# Patient Record
Sex: Male | Born: 1935 | Race: White | Hispanic: No | Smoking: Former smoker
Health system: Southern US, Community
[De-identification: ages and names within clinical notes are randomized; demographics above are authoritative.]

## PROBLEM LIST (undated history)

## (undated) DIAGNOSIS — J930 Spontaneous tension pneumothorax: Secondary | ICD-10-CM

## (undated) DIAGNOSIS — R0600 Dyspnea, unspecified: Secondary | ICD-10-CM

## (undated) DIAGNOSIS — R0602 Shortness of breath: Secondary | ICD-10-CM

## (undated) DIAGNOSIS — J449 Chronic obstructive pulmonary disease, unspecified: Secondary | ICD-10-CM

## (undated) DIAGNOSIS — E785 Hyperlipidemia, unspecified: Secondary | ICD-10-CM

## (undated) DIAGNOSIS — G4733 Obstructive sleep apnea (adult) (pediatric): Secondary | ICD-10-CM

## (undated) DIAGNOSIS — M1990 Unspecified osteoarthritis, unspecified site: Secondary | ICD-10-CM

## (undated) DIAGNOSIS — I10 Essential (primary) hypertension: Secondary | ICD-10-CM

## (undated) HISTORY — DX: Spontaneous tension pneumothorax: J93.0

## (undated) HISTORY — PX: EYE SURGERY: SHX253

## (undated) HISTORY — PX: LUNG SURGERY: SHX703

## (undated) HISTORY — PX: TOE SURGERY: SHX1073

## (undated) HISTORY — DX: Essential (primary) hypertension: I10

## (undated) HISTORY — DX: Hyperlipidemia, unspecified: E78.5

## (undated) HISTORY — DX: Obstructive sleep apnea (adult) (pediatric): G47.33

## (undated) HISTORY — DX: Shortness of breath: R06.02

---

## 1958-07-06 DIAGNOSIS — J930 Spontaneous tension pneumothorax: Secondary | ICD-10-CM

## 1958-07-06 HISTORY — DX: Spontaneous tension pneumothorax: J93.0

## 2001-10-26 ENCOUNTER — Encounter: Admission: RE | Admit: 2001-10-26 | Discharge: 2001-10-26 | Payer: Self-pay | Admitting: *Deleted

## 2001-10-26 ENCOUNTER — Encounter: Payer: Self-pay | Admitting: *Deleted

## 2001-10-31 ENCOUNTER — Ambulatory Visit (HOSPITAL_BASED_OUTPATIENT_CLINIC_OR_DEPARTMENT_OTHER): Admission: RE | Admit: 2001-10-31 | Discharge: 2001-10-31 | Payer: Self-pay | Admitting: *Deleted

## 2001-10-31 ENCOUNTER — Encounter (INDEPENDENT_AMBULATORY_CARE_PROVIDER_SITE_OTHER): Payer: Self-pay

## 2002-03-04 ENCOUNTER — Emergency Department (HOSPITAL_COMMUNITY): Admission: EM | Admit: 2002-03-04 | Discharge: 2002-03-04 | Payer: Self-pay | Admitting: Emergency Medicine

## 2008-08-16 ENCOUNTER — Emergency Department (HOSPITAL_COMMUNITY): Admission: EM | Admit: 2008-08-16 | Discharge: 2008-08-16 | Payer: Self-pay | Admitting: Emergency Medicine

## 2008-08-17 ENCOUNTER — Inpatient Hospital Stay (HOSPITAL_COMMUNITY): Admission: EM | Admit: 2008-08-17 | Discharge: 2008-08-19 | Payer: Self-pay | Admitting: Emergency Medicine

## 2008-08-17 ENCOUNTER — Ambulatory Visit: Payer: Self-pay | Admitting: Family Medicine

## 2009-09-13 ENCOUNTER — Ambulatory Visit (HOSPITAL_BASED_OUTPATIENT_CLINIC_OR_DEPARTMENT_OTHER): Admission: RE | Admit: 2009-09-13 | Discharge: 2009-09-14 | Payer: Self-pay | Admitting: Urology

## 2010-09-28 LAB — URINALYSIS, ROUTINE W REFLEX MICROSCOPIC
Bilirubin Urine: NEGATIVE
Glucose, UA: NEGATIVE mg/dL
Ketones, ur: NEGATIVE mg/dL
Leukocytes, UA: NEGATIVE
Nitrite: NEGATIVE
Protein, ur: NEGATIVE mg/dL
Specific Gravity, Urine: 1.022 (ref 1.005–1.030)
Urobilinogen, UA: 0.2 mg/dL (ref 0.0–1.0)
pH: 5.5 (ref 5.0–8.0)

## 2010-09-28 LAB — PROTIME-INR: Prothrombin Time: 13.3 seconds (ref 11.6–15.2)

## 2010-09-28 LAB — CBC
HCT: 44.6 % (ref 39.0–52.0)
Hemoglobin: 14.4 g/dL (ref 13.0–17.0)
MCHC: 32.3 g/dL (ref 30.0–36.0)
MCV: 92.4 fL (ref 78.0–100.0)
Platelets: 188 10*3/uL (ref 150–400)
RBC: 4.83 MIL/uL (ref 4.22–5.81)
RDW: 14.9 % (ref 11.5–15.5)
WBC: 8.7 10*3/uL (ref 4.0–10.5)

## 2010-09-28 LAB — URINE MICROSCOPIC-ADD ON

## 2010-09-28 LAB — COMPREHENSIVE METABOLIC PANEL
ALT: 18 U/L (ref 0–53)
Albumin: 4.3 g/dL (ref 3.5–5.2)
Alkaline Phosphatase: 67 U/L (ref 39–117)
Chloride: 103 mEq/L (ref 96–112)
Glucose, Bld: 100 mg/dL — ABNORMAL HIGH (ref 70–99)
Potassium: 3.3 mEq/L — ABNORMAL LOW (ref 3.5–5.1)
Sodium: 139 mEq/L (ref 135–145)
Total Bilirubin: 0.7 mg/dL (ref 0.3–1.2)
Total Protein: 7.5 g/dL (ref 6.0–8.3)

## 2010-10-21 ENCOUNTER — Inpatient Hospital Stay (INDEPENDENT_AMBULATORY_CARE_PROVIDER_SITE_OTHER)
Admission: RE | Admit: 2010-10-21 | Discharge: 2010-10-21 | Disposition: A | Discharge status: Home / Self Care | Payer: Medicare Other | Source: Ambulatory Visit | Attending: Emergency Medicine | Admitting: Emergency Medicine

## 2010-10-21 DIAGNOSIS — R300 Dysuria: Secondary | ICD-10-CM

## 2010-10-21 LAB — CBC
HCT: 35.5 % — ABNORMAL LOW (ref 39.0–52.0)
Hemoglobin: 12.5 g/dL — ABNORMAL LOW (ref 13.0–17.0)
Hemoglobin: 14.4 g/dL (ref 13.0–17.0)
MCHC: 34.9 g/dL (ref 30.0–36.0)
MCV: 88.2 fL (ref 78.0–100.0)
MCV: 89.4 fL (ref 78.0–100.0)
MCV: 90.1 fL (ref 78.0–100.0)
Platelets: 147 10*3/uL — ABNORMAL LOW (ref 150–400)
RBC: 3.98 MIL/uL — ABNORMAL LOW (ref 4.22–5.81)
RBC: 4.69 MIL/uL (ref 4.22–5.81)
RDW: 14.3 % (ref 11.5–15.5)
WBC: 9.3 10*3/uL (ref 4.0–10.5)
WBC: 9.6 10*3/uL (ref 4.0–10.5)

## 2010-10-21 LAB — BASIC METABOLIC PANEL
BUN: 11 mg/dL (ref 6–23)
BUN: 12 mg/dL (ref 6–23)
Calcium: 9 mg/dL (ref 8.4–10.5)
Chloride: 103 mEq/L (ref 96–112)
Chloride: 104 mEq/L (ref 96–112)
Creatinine, Ser: 0.84 mg/dL (ref 0.4–1.5)
GFR calc Af Amer: >60 mL/min (ref >60)
GFR calc non Af Amer: >60 mL/min (ref >60)
Potassium: 3.2 mEq/L — ABNORMAL LOW (ref 3.5–5.1)

## 2010-10-21 LAB — POCT URINALYSIS DIP (DEVICE)
Protein, ur: 30 mg/dL — AB
Specific Gravity, Urine: 1.025 (ref 1.005–1.030)
Urobilinogen, UA: 0.2 mg/dL (ref 0.0–1.0)

## 2010-10-21 LAB — RPR: RPR Ser Ql: NONREACTIVE

## 2010-10-21 LAB — CULTURE, BLOOD (ROUTINE X 2)

## 2010-10-21 LAB — PROTIME-INR
INR: 1 (ref 0.00–1.49)
Prothrombin Time: 13.3 seconds (ref 11.6–15.2)

## 2010-10-21 LAB — COMPREHENSIVE METABOLIC PANEL
BUN: 12 mg/dL (ref 6–23)
CO2: 27 mEq/L (ref 19–32)
Calcium: 8.6 mg/dL (ref 8.4–10.5)
Creatinine, Ser: 0.81 mg/dL (ref 0.4–1.5)
GFR calc non Af Amer: >60 mL/min (ref >60)
Glucose, Bld: 106 mg/dL — ABNORMAL HIGH (ref 70–99)

## 2010-10-21 LAB — DIFFERENTIAL
Eosinophils Absolute: 0 10*3/uL (ref 0.0–0.7)
Lymphs Abs: 1.7 10*3/uL (ref 0.7–4.0)
Neutrophils Relative %: 75 % (ref 43–77)

## 2010-10-21 LAB — HIV ANTIBODY (ROUTINE TESTING W REFLEX): HIV: NONREACTIVE

## 2010-10-21 LAB — APTT: aPTT: 33 seconds (ref 24–37)

## 2010-11-13 ENCOUNTER — Telehealth: Payer: Self-pay | Admitting: Cardiology

## 2010-11-18 ENCOUNTER — Encounter: Payer: Self-pay | Admitting: Internal Medicine

## 2010-11-18 NOTE — H&P (Signed)
NAME:  Joseph [name], Joseph [name]              ACCOUNT NO.:  [id number]   MEDICAL RECORD NO.:  [id number]          PATIENT TYPE:  EMS   LOCATION:  MAJO                         FACILITY:  [facility]   PHYSICIAN:  C. Ulyess Mort, M.D.DATE OF BIRTH:  [birth year]   DATE OF ADMISSION:  08/16/2008  DATE OF DISCHARGE:  08/16/2008                              HISTORY & PHYSICAL   HISTORY OF PRESENT ILLNESS:  Joseph [name] is a 76 year old man with a  past medical history of BPH, history of prostatitis, hypertension, and  hyperlipidemia, who presented to [facility] Emergency Department with  complaints of a rash.  One week prior to admission, the patient noticed  what he describes as nodules in his perianal region.  He decided to  apply a cream that he had been prescribed about 5 years prior.  The  cream is named Aldara (imiquimod), and it was given to him for this same  problem with perianal nodule in the past.  After the first application,  he noted pruritus but did not think much of it.  On the second day,  after he applied it he noticed pain in the area.  He finally applied  some Benadryl cream in hopes that it would help with the pruritus and he  then applied some Delfen cream.  About 3 days prior to admission, the  patient started developing oropharyngeal ulcerations with pain in his  mouth.  He also developed small erythematous lesions on his extremities,  chest, and, back and had progressive irritation and pain in the perianal  region.  He decided to come to the emergency room to have his lesions  evaluated.  The patient has never had a similar presentation before.  He  denied any fevers or chills.  He does not have any documented medication  allergies.   ALLERGIES:  No known drug allergies.   PAST MEDICAL HISTORY:  1. BPH status post prostate biopsy in April 2003 which revealed benign      tissue.  The patient has been following up with his urologist every      6 months since then.  2. History of  prostatitis in April 2003.  3. Left spermatocele.  4. Hypertension.  5. Hyperlipidemia.   SUBSTANCE HISTORY:  The patient denies any tobacco, alcohol, or drug  use.   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  He is  uninsured.   FAMILY HISTORY:  His father died in his 44s of cancer.   REVIEW OF SYSTEMS:  A 12-point review of systems was negative except for  elements described in HPI.   PHYSICAL EXAMINATION:  VITAL SIGNS:  Temperature 97.9, blood pressure  123/71, respiratory rate 18, O2 sat 97% on room air, heart rate 78.  GENERAL:  Elderly man in no acute distress.  EYES:  Congenital injection with tearing and some purulent debris noted,  PERRLA, EOMI.  ENT:  Tongue dry with a white coating.  Several mucosal oropharyngeal  ulcerations of about 5-7 mm in diameter.  None of the ulcers were  bleeding or necrotic.  NECK:  Supple.  No lymphadenopathy, no thyromegaly, or carotid bruit.  RESPIRATORY:  Lungs clear to auscultation bilaterally with good air  movement.  CARDIOVASCULAR:  Regular rate and rhythm.  No murmurs, rubs, or gallops.  GI:  Bowel sounds positive.  ABDOMEN:  Soft, nontender, nondistended.  No palpable masses or  organomegaly.  EXTREMITIES:  No edema, cyanosis, or clubbing.  GU:  Perianal mucosal erythema with mild edema.  There are also some  small nodular/pustular lesions noted that do have the appearance of  condylomas.  SKIN:  Few scattered, erythematous, more well-defined macules on  extremities, abdomen, and back.  NEUROLOGIC:  The patient is awake, alert, and oriented x3.  Cranial  nerves intact.  Strength 5/5 in all extremities.  Sensitivity to light  touch, normal and symmetrical.  Gait normal.  PSYCHIATRIC:  Appropriate.   ADMISSION LABORATORY DATA:  White blood count 8.0, hemoglobin 14.4,  platelets 146, MCV 88, RDW 14.3.  PT 13.3, PTT 33.  Sodium 139,  potassium 2.9 with slight hemolysis noted, chloride 102, bicarb 27, BUN  12,  creatinine 0.1, glucose 106, calcium 8.6.  Total bili is 1.0, alk  phos 78, AST 22, ALT 16, total protein 7.0, albumin 3.96.   ASSESSMENT AND PLAN:  1. 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  The      patient has characteristic mucosal, conjunctival, and skin      involvement.  However, he does not have skin sloughing or epidermal      necrolysis.  At this point, he does not require any burn wound      care, but it is difficult to tell how this will progress.  The      patient refused hospitalization, but he did agree to returning to      the emergency room if his lesions progress or persisted.  He was      given a prescription for viscous lidocaine for comfort, Debacterol,      1 mL applicator to be applied to the ulcers b.i.d.  The patient was      instructed not to use the imiquimod cream anymore given that he      would likely develop a similar reaction in the future.  He was      advised to seek advice from either his dermatologist or his      urologist about the perianal lesions that he has.  At this point,      it is difficult to tell if the inflammation and nodules are      secondary to the Stevens-Johnson syndrome with mucosal involvement      or if they are the underlying pathology that led the patient to use      the cream.  The patient claims to be sexually active, does always      use protection, and he denies any rectal intercourse.  She also      denies any history of sexually transmitted diseases.  2. Hypokalemia.  The patient's potassium was 2.9 on admission and this      is despite slight hemolysis described by the lab.  As far as I can      tell, he is only on hydrochlorothiazide which could lead to      hypokalemia and he is not known to be on potassium supplementation      as an outpatient.  He was given 40 mEq of KCl in the emergency room      and given 10 tablets of KCl  20 mEq to be taken b.i.d. for 5 days      until he follows up with his  cardiologist whom he sees for his      blood pressure.  The patient is aware that not treating his      hyperkalemia could lead to cardiac arrhythmias or other potentially      life-threatening medical problems.  Again, the patient decided to      leave AMA.  Before he left, he signed an AMA form where the      potential risks of him not staying in the hospital for observation      were listed.      Olene Craven, M.D.  Electronically Signed      C. Ulyess Mort, M.D.  Electronically Signed   MC/MEDQ  D:  08/16/2008  T:  08/17/2008  Job:  [id number]

## 2010-11-18 NOTE — H&P (Signed)
Joseph [name], Joseph [name] NO.:  [id number]   MEDICAL RECORD NO.:  [id number]          PATIENT TYPE:  INP   LOCATION:  5507                         FACILITY:  [facility]   PHYSICIAN:  Pearlean Brownie, M.D.DATE OF BIRTH:  22-Mar-1935   DATE OF ADMISSION:  08/17/2008  DATE OF DISCHARGE:                              HISTORY & PHYSICAL   PRIMARY CARE PHYSICIAN:  [facility].   PRIMARY CARDIOLOGIST:  Peter M. Swazil[place], MD   PRIMARY UROLOGIST:  Lucrezia Starch. Earlene Plater, MD   CHIEF COMPLAINT:  Rash concerning for Stevens-Johnson syndrome.   HISTORY OF PRESENT ILLNESS:  This is a 76 year old pleasant white male  with no significant past medical history who presents with 45-day  history of rash after using Aldara cream on his perianal area.  He used  an old bottle of the Aldara cream on a perianal lesion that he was  seen by Dermatology for back in 2005.  He is not sure of the lesion  type.  He started the cream approximately 7-8 days ago and did not note  much for the first few days except for a little bit of itching and  burning.  He first noticed his mouth swelling with a little bit of pain  approximately 4 days ago with associated tearing and redness of his eyes  bilaterally.  The skin involvement of the hands with vesicular lesions  started yesterday.  He was seen in the emergency department at that time  and advised to be admitted for Stevens-Johnson syndrome, but the patient  declined.  He subsequently went to [facility] today due to the  fact that rash was starting to spread a little bit more in his hands.  He denies nausea, vomiting, diarrhea, fevers, or malaise.  He denies a  history of skin infections or reactions.  He denies a history of genital  herpes or warts.  He denies high-risk sexual behaviors or exposure to  HIV.   REVIEW OF SYSTEMS:  Negative except for above.   PAST MEDICAL HISTORY:  1. Hypertension.  2. Hyperlipidemia.  3. BPH.  4. Some type  of skin lesion on the perianal area, diagnosed in 2005.   PAST SURGICAL HISTORY:  1. Multiple prostate biopsies for BPH and elevated PSA.  2. Chest tube placed for spontaneous pneumothorax in 1966.   ALLERGIES:  ACE INHIBITORS cause angioedema.   MEDICATIONS:  1. Zetia, unknown dose.  2. Hydrochlorothiazide unknown dose.  3. Amlodipine 5 mg p.o. daily.  4. Ibuprofen p.r.n.  5. Aldara as above.   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  He is single and has three cats.  He has no  children.  He does not smoke.  He drinks occasional alcohol.  Denies any  illicit drugs.  His father was Dr. Leo Rod who was a general  practitioner here in [place].   FAMILY HISTORY:  Parents died of old age.  There is nothing significant  in family history that is contributory to the current problem.   ADVANCE DIRECTIVES:  The patient does not have any specific advance  directives.   PHYSICAL EXAMINATION:  VITAL SIGNS:  Temperature is 99.4, heart rate  121, blood pressure 111/79, respirations 18, and he is 98% on room air.  GENERAL:  This is a pleasant white male in no apparent distress.  HEENT:  Normal vision with glasses with the bedside Snellen chart.  Conjunctivae are injected bilaterally.  He has diffuse mucosal denuding  involving the mouth, palate, and tongue but not the nares.  NECK:  Supple.  CARDIOVASCULAR:  Regular rate and rhythm.  No murmurs, rubs, or gallops.  PULMONARY:  Clear to auscultation bilaterally.  ABDOMEN:  Positive bowel sounds, soft, nontender, and nondistended.  EXTREMITIES:  No edema.  SKIN:  HEENT exam as above.  Bilateral hands and the palms and dorsal  aspects have vesicles that are a few millimeters in size each and do not  seem to be clustered in any kind of pattern.  He has a small vesicle on  the anterior left shin and a few erythematous papules on his arms  bilaterally that he reports are old.  He  denies any lesions on his  truck.  His perianal area has macerated irritated tissue approximately  in a 3-inch diameter surrounding the anus, but no vesicles or warty  lesions.  NEUROLOGIC:  Nonfocal.   LABORATORY DATA:  Labs from August 16, 2008, showed white blood cell  count of 8.0, hemoglobin of 14.4, and platelets 146.  Sodium was 139,  potassium 2.9, chloride 102, bicarb 27, BUN 12, creatinine 0.81, glucose  106.  LFTs were normal.  INR is 1.0.   ASSESSMENT AND PLAN:  This is a 76 year old white male with presumably  Stevens-Johnson syndrome likely from Aldara cream.  1. Stevens-Johnson syndrome, we will admit him for observation, start      him on prednisone 40 mg p.o. daily. Give Magic Mouthwash and      artificial tears p.r.n.  We will watch his skin exam closely.  I      attempted to contact Dr. Dione Booze on multiple occasions, but no one      would answer in his office.  I wanted to get some further      recommendations by Ophthalmology, but given that his vision is      currently at baseline, we will hold on Ophthalmology consult for      now.  We will gently hydrate him and follow him for fevers.  2. Hypertension and hyperlipidemia is currently stable.  We will hold      his medicines while we observe Stevens-Johnson syndrome.  3. Fluids, electrolytes, nutrition/gastrointestinal.  Check his      potassium again as I think it was repleted yesterday in the      emergency department, start him on IV fluids.  Regular diet with      Ensure supplements because of the painful swallowing.  4. Prophylaxis.  Start him on a diet and hold Lovenox due to the low      platelets.  Start him on sequential compression devices only.   DISPOSITION:  Pending clinical improvement of the rash.      Lupita Raider, M.D.  Electronically Signed      Pearlean Brownie, M.D.  Electronically Signed    KS/MEDQ  D:  08/17/2008  T:  08/18/2008  Job:  [id number]

## 2010-11-18 NOTE — Discharge Summary (Signed)
NAMEBRENNER, VISCONTI NO.:  [id number]   MEDICAL RECORD NO.:  [id number]          PATIENT TYPE:  INP   LOCATION:  5507                         FACILITY:  [facility]   PHYSICIAN:  Pearlean Brownie, M.D.DATE OF BIRTH:  [birth year]   DATE OF ADMISSION:  08/17/2008  DATE OF DISCHARGE:  08/19/2008                               DISCHARGE SUMMARY   DISCHARGE DIAGNOSES:  1. Stevens-Johnson syndrome reaction.  2. Hypertension.  3. Hyperlipidemia.  4. Hypokalemia.  5. Benign prostatic hypertrophy.   DISCHARGE MEDICATIONS:  1. Prednisone 40 mg daily x7 more days for total of a 10-day course.  2. Hydrochlorothiazide previous dose.  3. Amlodipine 5 mg daily.  4. Zetia previous dose daily.  5. Stop Aldara.   IMAGING:  Chest x-ray obtained showing no acute cardiopulmonary process.   LABORATORY DATA:  Blood cultures, no growth to day x2.  HIV nonreactive.  Sodium 138, potassium 3.7, chloride 104, bicarb 24, glucose 123, BUN 11,  creatinine 0.68, calcium 8.2, white blood count 9.3, hemoglobin 12.5,  platelets 155.  RPR nonreactive.  Coags normal.   CONSULTANT:  None.   HOSPITAL COURSE:  For full summary, please see dictated H&P.   In short, this is a 76 year old male who presented with concern for  Stevens-Johnson reaction after using Aldara cream topically  1. Stevens-Johnson syndrome.  The patient initially was seen in the ER      where he was recommended to be admitted, but he declined.  A couple      of days later, he was injured in care center and was directly      admitted for management of concern for Stevens-Johnson reaction.      The patient was started on prednisone 40 mg daily and his lesions      is on his hands and palms bilaterally as well as conjunctivitis and      mild swelling and the desquamation slowly improved.  Prior to      discharge, he had no new lesions.  Vancomycin was started during      hospitalization.  He did have fever to 100.8, but prior  to      discharge, it was discontinued as his blood cultures returned with      no growth today and there is no evidence of infection of these      lesions.  The patient states he used only change in medication that      happened recently was that he used Aldara cream in periankle region      for a lesion, which upon inspection just revealed some excoriated      skin in that area.  He does state that he was diagnosed in 2005 by      dermatologist and given Aldara cream to use, which he used for 6      weeks with no reaction previously, but he had not used Aldara cream      since then in last episode.  2. Hypertension.  The patient was continued on home regimen of      hydrochlorothiazide and Norvasc.  3.  Hyperlipidemia.  The patient was continued on Zetia and is to      continue outpatient.   FOLLOWUP:  The patient is to follow up with [facility] within this week for  further evaluation of rash and ensuring that it is improving while.  The  patient is to call for appointment.  Number provided.  He said he once  followed up with Dr. Cleta Alberts.      Eustaquio Boyden, MD  Electronically Signed      Pearlean Brownie, M.D.  Electronically Signed    JG/MEDQ  D:  08/19/2008  T:  08/19/2008  Job:  [id number]   cc:   William A. Leveda Anna, M.D.

## 2010-11-21 NOTE — H&P (Signed)
[facility]  Patient:    Joseph [name], Joseph [name] Visit Number: [id number] MRN: [id number]          Service Type: Attending:  Radene Knee., M.D. Dictated by:   Radene Knee., M.D. Adm. Date:  10/24/01                           History and Physical  DATE OF BIRTH:  1934/07/11.  BRIEF HISTORY:  This patient, age 76, is scheduled for needle biopsy of the prostate and cystoscopy October 31, 2001.  CHIEF COMPLAINT:  Elevated PSA.  PRESENT ILLNESS:  This patient was initially seen in our office with prostatitis.  PSA at that time was 5.88.  He was treated with Levaquin and had his PSA repeated.  It was 6.2, and he was recommended for biopsy.  The patient reports that he voids with a fairly good flow and had a little discomfort in his left groin and testes which did resolve on the Levaquin.  The patient also had some tenderness in the prostate which resolved on the Levaquin.  He has not passed any gross blood, gravel, or stone.  He does not get up at night, and voids every 2 to 3 hours during the day and has a good flow.  ALLERGIES:  None to his knowledge.  MEDICATIONS: 1. Pravachol. 2. Vitamin E. 3. Levaquin.  PAST HISTORY:  Unremarkable for any serious illnesses or surgery.  FAMILY HISTORY:  Positive for cancer in his father who died at age 71.  Mother is living at age 64.  He has 2 siblings.  SOCIAL HISTORY:  The patient is single and does not abuse alcohol or tobacco. Works as a [employer] fast streamer.  REVIEW OF SYSTEMS: Weight has been stable.  General health is good. HEENT:  Unremarkable.  Cardiorespiratory:  No chest pain or heart attack or asthma.  GI:  No peptic ulcer disease or hepatitis.  Bowels move normally. Extremities:  He does have a Dupuytrens contracture right fifth finger but no ankle swelling.  Mild arthritis.  Neuropsyche:  No stroke, weakness or headaches nor falls.  Skin:  No rashes or hives.  Lymphatic/  hematopoietic: No anemia, or lumps or bumps.  PHYSICAL EXAMINATION:  GENERAL:  Well-developed well-nourished 76 year old male.  VITAL SIGNS:  Temperature 96.7.  Pulse is 68, respirations 16, and blood pressure 153/85.  HEENT:  The ears and tympanic membranes are unremarkable.  Eyes react normal to light and accommodation.  Extraocular movements are intact.  Pharynx benign. Teeth in good repair.  NECK:  No enlargement of thyroid or nodes or palpable.  CHEST:  Clear to auscultation and percussion.  HEART:  Normal sinus rhythm, no murmur.  ABDOMEN:  Flat.  Liver, kidney, spleen, mass or hernias not detected.  GENITALIA:  The external genitalia and meatus are normal.  The penis is uncircumcised.  The epididymis are slightly thickened but nontender.  There is a small left spermatocele.  Testes are normal and symmetrical.  Scrotum, anus, perineum, normal.  RECTAL:  Tone good.  Prostate is enlarged to 30 grams.  Smooth, firm and symmetrical.  EXTREMITIES:  No edema.  NEUROLOGIC:  Grossly normal reflexes and sensation.  LYMPHATIC:  No nodes.  SKIN:  No lesions.  DIAGNOSES: 1. Elevated PSA 2. Benign prostatic hypertrophy. 3. Prostatitis. 4. Left spermatocele.  PLAN:  Cysto and needle biopsy of prostate. Dictated by:   Radene Knee., M.D. Attending:  Radene Knee., M.D. DD:  10/24/01 TD:  10/24/01 Job: [id number]

## 2010-11-21 NOTE — Op Note (Signed)
[facility]  Patient:    Joseph [name], Joseph [name] Visit Number: [id number] MRN: [id number]          Service Type: NES Location: [facility] Attending Physician:  Dalbert Mayotte Dictated by:   Radene Knee., M.D. Proc. Date: 10/31/01 Admit Date:  10/31/2001                             Operative Report  DATE OF BIRTH:  1935-02-03  PREOPERATIVE DIAGNOSES: 1. Elevated prostate-specific antigen 6.2. 2. Benign prostatic hypertrophy. 3. Prostatitis. 4. Left spermatocele.  POSTOPERATIVE DIAGNOSES: 1. Elevated prostate-specific antigen 6.2. 2. Benign prostatic hypertrophy. 3. Prostatitis. 4. Left spermatocele.  OPERATION PERFORMED:  Transurethral needle biopsy of the prostate and flexible cystoscopy.  DESCRIPTION OF OPERATION:  This patient, age 76, brought to the operating room, prepared for surgery with IV gentamicin and was prepped and draped in the left decubitus position.  The prostate was visualized by transrectal ultrasound, but the visualization was not optimal.  A series of biopsies first from the left and then from the right prostate were then taken with an 18 gauge Biopty needle using the bip gun, and these were sent for pathologic examination.  The patient was then turned into the supine position.  It was noted that he had some blood from the urethral meatus.  A #18 Foley catheter was inserted.  The bladder was irrigated free of blood, and the flexible cystoscope was introduced.  The patient had some bleeding in the prostatic urethra, and he had kissing lateral lobes for a distance of about 3 cm.  He had early median bar formation.  The bladder showed no stone or tumor in the bladder.  There were a few small clots that were irrigated out without difficulty.  The right and left ureteral orifices were normal.  In view of the bleeding from the urethra, the 18 Foley catheter was re-placed and will be left in until his urine is  clear.  The plan is for the patient to go home on Cipro 500 mg b.i.d.  We will have him return to the office in 2-3 days for a report of his biopsy.  We will send him home with a Foley catheter if his urine does not clear satisfactorily. Dictated by:   Radene Knee., M.D. Attending Physician:  Dalbert Mayotte DD:  10/31/01 TD:  10/31/01 Job: [id number]

## 2010-11-24 ENCOUNTER — Encounter: Payer: Self-pay | Admitting: Cardiology

## 2010-11-24 DIAGNOSIS — E785 Hyperlipidemia, unspecified: Secondary | ICD-10-CM | POA: Insufficient documentation

## 2010-11-24 DIAGNOSIS — R0602 Shortness of breath: Secondary | ICD-10-CM | POA: Insufficient documentation

## 2010-11-24 DIAGNOSIS — I10 Essential (primary) hypertension: Secondary | ICD-10-CM | POA: Insufficient documentation

## 2010-11-24 DIAGNOSIS — J930 Spontaneous tension pneumothorax: Secondary | ICD-10-CM | POA: Insufficient documentation

## 2010-11-26 ENCOUNTER — Encounter: Payer: Self-pay | Admitting: Cardiology

## 2010-11-26 ENCOUNTER — Ambulatory Visit (INDEPENDENT_AMBULATORY_CARE_PROVIDER_SITE_OTHER): Payer: Medicare Other | Admitting: Cardiology

## 2010-11-26 VITALS — BP 128/76 | HR 56 | Ht 72.0 in | Wt 171.5 lb

## 2010-11-26 DIAGNOSIS — E785 Hyperlipidemia, unspecified: Secondary | ICD-10-CM

## 2010-11-26 DIAGNOSIS — I10 Essential (primary) hypertension: Secondary | ICD-10-CM

## 2010-11-26 NOTE — Patient Instructions (Signed)
Continue current medications.  We will get a copy of your lab work from Dr. Ricki Miller.  I will see you back in 1 year.

## 2010-11-26 NOTE — Assessment & Plan Note (Signed)
Continue therapy with niacin and Zetia. We will obtain a copy of his lab work from Dr. Ricki Miller.

## 2010-11-26 NOTE — Progress Notes (Signed)
   Joseph [name] Date of Birth: December 26, 1934   History of Present Illness: Joseph [name] is seen for yearly followup today. He states he has done very well this past year. He denies any chest pain, shortness of breath or palpitations. He has seen Dr. Ricki Miller as his primary physician and had lab work there. He reports his blood pressure has been doing very well.  Current Outpatient Prescriptions on File Prior to Visit  Medication Sig Dispense Refill  . amLODipine (NORVASC) 5 MG tablet Take 5 mg by mouth daily.        Marland Kitchen aspirin 81 MG tablet Take 81 mg by mouth daily.        Marland Kitchen ezetimibe (ZETIA) 10 MG tablet Take 10 mg by mouth daily.        . hydrochlorothiazide (,MICROZIDE/HYDRODIURIL,) 12.5 MG capsule Take 12.5 mg by mouth daily.        . niacin 500 MG tablet Take 500 mg by mouth daily with breakfast.          Allergies  Allergen Reactions  . Aldara   . Ciprocin-Fluocin-Procin (Fluocinolone Acetonide)   . Penicillins   . Quinolones   . Statins     Past Medical History  Diagnosis Date  . Hyperlipidemia   . Hypertension   . Pneumothorax, spontaneous, tension 1960  . SOB (shortness of breath) on exertion     Past Surgical History  Procedure Date  . Toe surgery     History  Smoking status  . Former Smoker -- 1.0 packs/day for 5 years  . Types: Cigarettes  . Quit date: 11/24/1958  Smokeless tobacco  . Never Used    History  Alcohol Use No    No family history on file.  Review of Systems: The review of systems is positive for A. Urinary tract infection one month ago that was treated with Macrodantin.  He has had surgery on his left toe.All other systems were reviewed and are negative.  Physical Exam: BP 128/76  Pulse 56  Ht 6' (1.829 m)  Wt 171 lb 8 oz (77.792 kg)  BMI 23.26 kg/m2 He is a pleasant white male in no distress. His HEENT exam is unremarkable. He has no JVD or bruits. Lungs clear. Cardiac exam reveals a regular rate and rhythm without gallop, murmur, or  click. Abdomen is soft and nontender. He has no edema. Pedal pulses are palpable. LABORATORY DATA: ECG today demonstrates sinus bradycardia with a normal ECG.  Assessment / Plan:

## 2010-11-26 NOTE — Assessment & Plan Note (Signed)
Blood pressure is well controlled on his current medications. Will continue the same.

## 2011-02-04 ENCOUNTER — Telehealth: Payer: Self-pay | Admitting: Cardiology

## 2011-02-04 NOTE — Telephone Encounter (Signed)
Pt called back with current phone number He is at home now.

## 2011-02-04 NOTE — Telephone Encounter (Signed)
PT SAID HE HAS BEEN HAVING PAIN IN HIS LEGS AND IS WONDERING IF ONE OF HIS MEDS MAY BE CAUSING IT. CHART IN BOX.

## 2011-02-04 NOTE — Telephone Encounter (Signed)
Called stating he is having some pain in legs for awhile. Wondered if could be any medications that he is taking. Per Dr. Swaziland doesn't think could be meds but if he wanted to stop Zetia for awhile he could do that. Will try that and advised if continues needs to call Dr. Ricki Miller.

## 2011-02-10 ENCOUNTER — Telehealth: Payer: Self-pay | Admitting: Cardiology

## 2011-02-10 NOTE — Telephone Encounter (Signed)
Chart pulled and in box °

## 2011-02-10 NOTE — Telephone Encounter (Signed)
Pt stopped taking Zidia and pt would like to speak with you about that.  Please call pt back

## 2011-02-11 NOTE — Telephone Encounter (Signed)
Per Dr. Swaziland advised to stop Zetia since causing leg aches. Mr.Sollenberger states Dr. Ricki Miller did lab recently. Will get copy.

## 2011-03-09 ENCOUNTER — Other Ambulatory Visit: Payer: Self-pay | Admitting: Cardiology

## 2011-10-20 NOTE — Telephone Encounter (Signed)
ENCOUNTER COMPLETED 

## 2011-10-22 ENCOUNTER — Telehealth: Payer: Self-pay | Admitting: Cardiology

## 2011-10-22 NOTE — Telephone Encounter (Signed)
Refill amlodipine  5 mg [employer] [phone], 90 day supply with refills

## 2011-10-23 ENCOUNTER — Other Ambulatory Visit: Payer: Self-pay

## 2011-10-23 MED ORDER — AMLODIPINE BESYLATE 5 MG PO TABS: 5.0000 mg | ORAL_TABLET | Freq: Every day | ORAL | Status: DC

## 2011-12-03 ENCOUNTER — Encounter: Payer: Self-pay | Admitting: Cardiology

## 2011-12-03 ENCOUNTER — Ambulatory Visit (INDEPENDENT_AMBULATORY_CARE_PROVIDER_SITE_OTHER): Payer: Medicare Other | Admitting: Cardiology

## 2011-12-03 VITALS — BP 118/70 | HR 66 | Ht 72.0 in | Wt 168.4 lb

## 2011-12-03 DIAGNOSIS — E039 Hypothyroidism, unspecified: Secondary | ICD-10-CM

## 2011-12-03 DIAGNOSIS — E785 Hyperlipidemia, unspecified: Secondary | ICD-10-CM

## 2011-12-03 DIAGNOSIS — I10 Essential (primary) hypertension: Secondary | ICD-10-CM

## 2011-12-03 DIAGNOSIS — R0602 Shortness of breath: Secondary | ICD-10-CM

## 2011-12-03 NOTE — Patient Instructions (Signed)
Continue your current therapy  I will see you again in 1 year.   

## 2011-12-03 NOTE — Assessment & Plan Note (Signed)
Blood pressure is under excellent control. 

## 2011-12-03 NOTE — Progress Notes (Signed)
   Joseph [name] Date of Birth: [birth year]   History of Present Illness: Joseph [name] is seen for yearly followup today. He has a history of hypertension, hyperlipidemia, and hypothyroidism. He reports he has been feeling very well. He is now retired. 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 His blood pressure has been doing very well. He notes that when he was mowing his yard and 90 heat he did get short of breath. He had a normal stress test 2 years ago. He denies any chest pain.  Current Outpatient Prescriptions on File Prior to Visit  Medication Sig Dispense Refill  . amLODipine (NORVASC) 5 MG tablet Take 1 tablet (5 mg total) by mouth daily.  90 tablet  1  . aspirin 81 MG tablet Take 81 mg by mouth daily.        . hydrochlorothiazide (,MICROZIDE/HYDRODIURIL,) 12.5 MG capsule TAKE ONE CAPSULE BY MOUTH EVERY DAY  90 capsule  4  . levothyroxine (SYNTHROID) 100 MCG tablet Take 110 mcg by mouth daily.       . niacin 500 MG tablet Take 500 mg by mouth daily with breakfast.          Allergies  Allergen Reactions  . Ciprocin-Fluocin-Procin (Fluocinolone Acetonide)   . Imiquimod   . Penicillins   . Quinolones   . Statins     Past Medical History  Diagnosis Date  . Hyperlipidemia   . Hypertension   . Pneumothorax, spontaneous, tension 1960  . SOB (shortness of breath) on exertion     Past Surgical History  Procedure Date  . Toe surgery     History  Smoking status  . Former Smoker -- 1.0 packs/day for 5 years  . Types: Cigarettes  . Quit date: 11/24/1958  Smokeless tobacco  . Never Used    History  Alcohol Use No    History reviewed. No pertinent family history.  Review of Systems: As noted in history of present illness.All other systems were reviewed and are negative.  Physical Exam: BP 118/70  Pulse 66  Ht 6' (1.829 m)  Wt 168 lb 6.4 oz (76.386 kg)  BMI 22.84 kg/m2 He is a pleasant white male in no distress. His HEENT exam is unremarkable. He has no JVD  or bruits. Lungs clear. Cardiac exam reveals a regular rate and rhythm without gallop, murmur, or click. Abdomen is soft and nontender. He has no edema. Pedal pulses are palpable. LABORATORY DATA: ECG today demonstrates sinus bradycardia with a normal ECG.  Assessment / Plan:

## 2011-12-03 NOTE — Assessment & Plan Note (Signed)
This is somewhat of a chronic complaint. He had a normal stress test 2 years ago. He is going to try and exercise more and call if he has any significant chest pain.

## 2011-12-03 NOTE — Assessment & Plan Note (Signed)
He is on chronic therapy with Zetia and niacin. Lab work is followed by Dr. Ricki Miller

## 2012-02-15 DIAGNOSIS — R972 Elevated prostate specific antigen [PSA]: Secondary | ICD-10-CM | POA: Insufficient documentation

## 2012-02-15 DIAGNOSIS — N529 Male erectile dysfunction, unspecified: Secondary | ICD-10-CM | POA: Insufficient documentation

## 2012-08-09 ENCOUNTER — Other Ambulatory Visit: Payer: 59

## 2012-09-27 ENCOUNTER — Telehealth: Payer: Self-pay

## 2012-09-27 NOTE — Telephone Encounter (Signed)
Received call from [facility] pharmacy ([place]) stating pt needed refill on Amlodipine 5 MG. Called in Amlodipine 5MG  daily 30 tablets 1 refill Pharmacist let patient know he needed to call and schedule an office visit to receive further refills.

## 2013-04-01 ENCOUNTER — Encounter (HOSPITAL_COMMUNITY): Payer: Self-pay | Admitting: *Deleted

## 2013-04-01 ENCOUNTER — Emergency Department (HOSPITAL_COMMUNITY)
Admission: EM | Admit: 2013-04-01 | Discharge: 2013-04-01 | Disposition: A | Discharge status: Home / Self Care | Payer: Medicare PPO | Attending: Emergency Medicine | Admitting: Emergency Medicine

## 2013-04-01 DIAGNOSIS — Z87891 Personal history of nicotine dependence: Secondary | ICD-10-CM | POA: Insufficient documentation

## 2013-04-01 DIAGNOSIS — Z7982 Long term (current) use of aspirin: Secondary | ICD-10-CM | POA: Insufficient documentation

## 2013-04-01 DIAGNOSIS — Z8639 Personal history of other endocrine, nutritional and metabolic disease: Secondary | ICD-10-CM | POA: Insufficient documentation

## 2013-04-01 DIAGNOSIS — Y929 Unspecified place or not applicable: Secondary | ICD-10-CM | POA: Insufficient documentation

## 2013-04-01 DIAGNOSIS — Z862 Personal history of diseases of the blood and blood-forming organs and certain disorders involving the immune mechanism: Secondary | ICD-10-CM | POA: Insufficient documentation

## 2013-04-01 DIAGNOSIS — Z79899 Other long term (current) drug therapy: Secondary | ICD-10-CM | POA: Insufficient documentation

## 2013-04-01 DIAGNOSIS — Z880 Allergy status to penicillin: Secondary | ICD-10-CM | POA: Insufficient documentation

## 2013-04-01 DIAGNOSIS — T63461A Toxic effect of venom of wasps, accidental (unintentional), initial encounter: Secondary | ICD-10-CM | POA: Insufficient documentation

## 2013-04-01 DIAGNOSIS — Y9389 Activity, other specified: Secondary | ICD-10-CM | POA: Insufficient documentation

## 2013-04-01 DIAGNOSIS — I10 Essential (primary) hypertension: Secondary | ICD-10-CM | POA: Insufficient documentation

## 2013-04-01 DIAGNOSIS — Z8709 Personal history of other diseases of the respiratory system: Secondary | ICD-10-CM | POA: Insufficient documentation

## 2013-04-01 DIAGNOSIS — T6391XA Toxic effect of contact with unspecified venomous animal, accidental (unintentional), initial encounter: Secondary | ICD-10-CM | POA: Insufficient documentation

## 2013-04-01 DIAGNOSIS — W57XXXA Bitten or stung by nonvenomous insect and other nonvenomous arthropods, initial encounter: Secondary | ICD-10-CM

## 2013-04-01 MED ORDER — FAMOTIDINE 20 MG PO TABS: 20.0000 mg | ORAL_TABLET | Freq: Two times a day (BID) | ORAL | Status: DC

## 2013-04-01 MED ORDER — DIPHENHYDRAMINE HCL 25 MG PO TABS: 50.0000 mg | ORAL_TABLET | ORAL | Status: DC | PRN

## 2013-04-01 MED ORDER — PREDNISONE 20 MG PO TABS: 40.0000 mg | ORAL_TABLET | Freq: Every day | ORAL | Status: DC

## 2013-04-01 MED ORDER — PREDNISONE 20 MG PO TABS
60.0000 mg | ORAL_TABLET | Freq: Once | ORAL | Status: AC
  Administered 2013-04-01: 60 mg via ORAL
  Filled 2013-04-01: qty 3

## 2013-04-01 MED ORDER — FAMOTIDINE 20 MG PO TABS
20.0000 mg | ORAL_TABLET | Freq: Once | ORAL | Status: AC
  Administered 2013-04-01: 20 mg via ORAL
  Filled 2013-04-01: qty 1

## 2013-04-01 MED ORDER — HYDROCODONE-ACETAMINOPHEN 5-325 MG PO TABS: 2.0000 | ORAL_TABLET | ORAL | Status: DC | PRN

## 2013-04-01 NOTE — ED Provider Notes (Signed)
CSN: [id number]     Arrival date & time 04/01/13  1912 History  This chart was scribed for non-physician practitioner Wynetta Emery, PA-C working with Gwyneth Sprout, MD by Danella Maiers, ED Scribe. This patient was seen in room TR09C/TR09C and the patient's care was started at 7:52 PM.   Chief Complaint  Patient presents with  . Insect Bite   The history is provided by the patient. No language interpreter was used.   HPI Comments: Joseph [name] is a 78 y.o. male who presents to the Emergency Department complaining of 5-6 hornet stings to both hands, both legs, and head with associated pain, redness, and swelling that occurred one hour ago. He denies anaphylactic reaction to stings. He denies SOB, tongue swelling, lip swelling.  Past Medical History  Diagnosis Date  . Hyperlipidemia   . Hypertension   . Pneumothorax, spontaneous, tension 1960  . SOB (shortness of breath) on exertion    Past Surgical History  Procedure Laterality Date  . Toe surgery     No family history on file. History  Substance Use Topics  . Smoking status: Former Smoker -- 1.00 packs/day for 5 years    Types: Cigarettes    Quit date: 11/24/1958  . Smokeless tobacco: Never Used  . Alcohol Use: No    Review of Systems A complete 10 system review of systems was obtained and all systems are negative except as noted in the HPI and PMH.   Allergies  Ciprocin-fluocin-procin; Imiquimod; Penicillins; Quinolones; and Statins  Home Medications   Current Outpatient Rx  Name  Route  Sig  Dispense  Refill  . amLODipine (NORVASC) 5 MG tablet   Oral   Take 1 tablet (5 mg total) by mouth daily.   90 tablet   1   . aspirin 81 MG tablet   Oral   Take 81 mg by mouth daily.           . hydrochlorothiazide (,MICROZIDE/HYDRODIURIL,) 12.5 MG capsule      TAKE ONE CAPSULE BY MOUTH EVERY DAY   90 capsule   4   . levothyroxine (SYNTHROID) 100 MCG tablet   Oral   Take 110 mcg by mouth daily.           . niacin 500 MG tablet   Oral   Take 500 mg by mouth daily with breakfast.           . nitrofurantoin, macrocrystal-monohydrate, (MACROBID) 100 MG capsule                BP 156/90  Pulse 67  Temp(Src) 97.6 F (36.4 C) (Oral)  Resp 18  SpO2 97% Physical Exam  Nursing note and vitals reviewed. Constitutional: He is oriented to person, place, and time. He appears well-developed and well-nourished. No distress.  HENT:  Head: Normocephalic.  No lip or tongue swelling, posterior pharynx is not edematous. There is no stridor patient is handling his secretions without issue  Eyes: Conjunctivae and EOM are normal.  Cardiovascular: Normal rate.   Pulmonary/Chest: Effort normal and breath sounds normal. No stridor. No respiratory distress. He has no wheezes. He has no rales. He exhibits no tenderness.  Abdominal: Soft. There is no tenderness.  Musculoskeletal: Normal range of motion. He exhibits no edema.  Neurological: He is alert and oriented to person, place, and time.  Skin:  Erythema swelling and mild tenderness to palpation to bilateral hands, posterior left leg  Psychiatric: He has a normal mood and affect.  ED Course  Procedures (including critical care time) Medications - No data to display  DIAGNOSTIC STUDIES: Oxygen Saturation is 97% on RA, normal by my interpretation.    COORDINATION OF CARE: 7:50 PM- Discussed treatment plan with pt which includes pain meds and pt agrees to plan.    Labs Review Labs Reviewed - No data to display Imaging Review No results found.  MDM   1. Insect bites and stings, initial encounter     Filed Vitals:   04/01/13 1916 04/01/13 2025  BP: 156/90 147/89  Pulse: 67 72  Temp: 97.6 F (36.4 C) 98.6 F (37 C)  TempSrc: Oral Oral  Resp: 18 14  SpO2: 97% 95%     Joseph [name] is a 78 y.o. male with multiple hornet stings to bilateral hands and lower extremities. No signs of anaphylaxis or airway compromise. This is a  shared visit with the attending physician who personally evaluated the patient and agrees with the care plan. Patient will be given prednisone, Pepcid and Benadryl to decrease swelling and discomfort.  Medications  predniSONE (DELTASONE) tablet 60 mg (60 mg Oral Given 04/01/13 2024)  famotidine (PEPCID) tablet 20 mg (20 mg Oral Given 04/01/13 2023)    Pt is hemodynamically stable, appropriate for, and amenable to discharge at this time. Pt verbalized understanding and agrees with care plan. All questions answered. Outpatient follow-up and specific return precautions discussed.    Discharge Medication List as of 04/01/2013  8:19 PM    START taking these medications   Details  diphenhydrAMINE (BENADRYL) 25 MG tablet Take 2 tablets (50 mg total) by mouth every 4 (four) hours as needed (swelling and itching)., Starting 04/01/2013, Until Discontinued, Print    famotidine (PEPCID) 20 MG tablet Take 1 tablet (20 mg total) by mouth 2 (two) times daily., Starting 04/01/2013, Until Discontinued, Print    HYDROcodone-acetaminophen (NORCO/VICODIN) 5-325 MG per tablet Take 2 tablets by mouth every 4 (four) hours as needed for pain., Starting 04/01/2013, Until Discontinued, Print    predniSONE (DELTASONE) 20 MG tablet Take 2 tablets (40 mg total) by mouth daily., Starting 04/01/2013, Until Discontinued, Print        I personally performed the services described in this documentation, which was scribed in my presence. The recorded information has been reviewed and is accurate.  Note: Portions of this report may have been transcribed using voice recognition software. Every effort was made to ensure accuracy; however, inadvertent computerized transcription errors may be present    Wynetta Emery, PA-C 04/03/13 [phone]

## 2013-04-01 NOTE — ED Notes (Signed)
Pt c/o 5-6 hornet stings to bilateral hands and one on back of leg. Redness swelling noted. Denies difficulty breathing. No swelling noted to tongue or around airway.

## 2013-04-05 NOTE — ED Provider Notes (Signed)
Medical screening examination/treatment/procedure(s) were conducted as a shared visit with non-physician practitioner(s) and myself.  I personally evaluated the patient during the encounter Pt with multiple bee stings with localized reaction but no signs of systemic reaction such as SOB, tongue or oral swelling.  D/ced home with allergic cocktail  Gwyneth Sprout, MD 04/05/13 2042

## 2014-01-24 ENCOUNTER — Ambulatory Visit
Admission: RE | Admit: 2014-01-24 | Discharge: 2014-01-24 | Disposition: A | Discharge status: Home / Self Care | Payer: Medicare PPO | Source: Ambulatory Visit | Attending: Internal Medicine | Admitting: Internal Medicine

## 2014-01-24 ENCOUNTER — Other Ambulatory Visit: Payer: Self-pay | Admitting: Internal Medicine

## 2014-01-24 DIAGNOSIS — R109 Unspecified abdominal pain: Secondary | ICD-10-CM

## 2014-01-24 DIAGNOSIS — R319 Hematuria, unspecified: Secondary | ICD-10-CM

## 2014-01-26 ENCOUNTER — Other Ambulatory Visit: Payer: Self-pay | Admitting: Internal Medicine

## 2014-01-26 DIAGNOSIS — R911 Solitary pulmonary nodule: Secondary | ICD-10-CM

## 2014-01-30 ENCOUNTER — Ambulatory Visit
Admission: RE | Admit: 2014-01-30 | Discharge: 2014-01-30 | Disposition: A | Discharge status: Home / Self Care | Payer: Medicare PPO | Source: Ambulatory Visit | Attending: Internal Medicine | Admitting: Internal Medicine

## 2014-01-30 DIAGNOSIS — R911 Solitary pulmonary nodule: Secondary | ICD-10-CM

## 2014-05-17 ENCOUNTER — Other Ambulatory Visit: Payer: Self-pay | Admitting: Internal Medicine

## 2014-05-17 DIAGNOSIS — R911 Solitary pulmonary nodule: Secondary | ICD-10-CM

## 2014-05-23 ENCOUNTER — Ambulatory Visit
Admission: RE | Admit: 2014-05-23 | Discharge: 2014-05-23 | Disposition: A | Discharge status: Home / Self Care | Payer: Medicare PPO | Source: Ambulatory Visit | Attending: Internal Medicine | Admitting: Internal Medicine

## 2014-05-23 DIAGNOSIS — R911 Solitary pulmonary nodule: Secondary | ICD-10-CM

## 2014-05-23 MED ORDER — IOHEXOL 300 MG/ML  SOLN: 75.0000 mL | Freq: Once | INTRAMUSCULAR | Status: AC | PRN

## 2015-12-18 ENCOUNTER — Other Ambulatory Visit: Payer: Self-pay | Admitting: Internal Medicine

## 2015-12-18 DIAGNOSIS — R911 Solitary pulmonary nodule: Secondary | ICD-10-CM

## 2015-12-30 ENCOUNTER — Ambulatory Visit
Admission: RE | Admit: 2015-12-30 | Discharge: 2015-12-30 | Disposition: A | Discharge status: Home / Self Care | Payer: Medicare PPO | Source: Ambulatory Visit | Attending: Internal Medicine | Admitting: Internal Medicine

## 2015-12-30 DIAGNOSIS — R911 Solitary pulmonary nodule: Secondary | ICD-10-CM

## 2016-01-09 ENCOUNTER — Ambulatory Visit (INDEPENDENT_AMBULATORY_CARE_PROVIDER_SITE_OTHER): Payer: Medicare PPO | Admitting: Pulmonary Disease

## 2016-01-09 ENCOUNTER — Encounter: Payer: Self-pay | Admitting: Pulmonary Disease

## 2016-01-09 VITALS — BP 112/68 | HR 60 | Ht 72.0 in | Wt 175.4 lb

## 2016-01-09 DIAGNOSIS — R938 Abnormal findings on diagnostic imaging of other specified body structures: Secondary | ICD-10-CM | POA: Diagnosis not present

## 2016-01-09 DIAGNOSIS — J449 Chronic obstructive pulmonary disease, unspecified: Secondary | ICD-10-CM | POA: Insufficient documentation

## 2016-01-09 DIAGNOSIS — R0602 Shortness of breath: Secondary | ICD-10-CM

## 2016-01-09 DIAGNOSIS — R911 Solitary pulmonary nodule: Secondary | ICD-10-CM | POA: Diagnosis not present

## 2016-01-09 DIAGNOSIS — J432 Centrilobular emphysema: Secondary | ICD-10-CM | POA: Diagnosis not present

## 2016-01-09 DIAGNOSIS — R9389 Abnormal findings on diagnostic imaging of other specified body structures: Secondary | ICD-10-CM

## 2016-01-09 NOTE — Patient Instructions (Signed)
It was a pleasure taking care of you today!  You are diagnosed with lung nodule. We will schedule you for a PET scan. We will call you after PET scan is done.   Follow up depending on PET scan results.

## 2016-01-09 NOTE — Progress Notes (Signed)
Subjective:    Patient ID: Joseph [name], male    DOB: [birth year], 81 y.o.   MRN: [id number]  HPI   This is the case of Joseph [name], 81 y.o. Male, who was referred by Dr. Tommy Medal and Dr. Deland Pretty in consultation regarding abnormal chest CT scan  As you very well know, patient 10PY smoking history, quit in mid 1960s.  Pt was dxed with COPD 6 mos ago. With prn alb. Uses every month. He got delirious with inhaled steroids. OK currently. He is not too symptomatic as far as his dyspnea is concerned.  Patient had a chest CT scan in July 2015 which showed a left lower lobe pulmonary nodule measuring 1 x 1.6 cm. He had a repeat chest CT scan in November 2015 which showed the nodule to be not as prominent. He had a follow-up chest CT scan in June of this year which showed increase in the nodule over at the left lower lobe, now measuring 2.3 x 1.9 cm.      Review of Systems  Constitutional: Negative.   HENT: Negative.   Eyes: Negative.   Respiratory: Positive for cough and shortness of breath.   Cardiovascular: Negative.   Gastrointestinal: Negative.   Endocrine: Negative.   Genitourinary: Negative.   Musculoskeletal: Negative.   Skin: Negative.   Allergic/Immunologic: Negative.   Neurological: Negative.   Hematological: Negative.   Psychiatric/Behavioral: Negative.    Past Medical History  Diagnosis Date  . Hyperlipidemia   . Hypertension   . Pneumothorax, spontaneous, tension 1960  . SOB (shortness of breath) on exertion    (-) CA, DVT Had R PTX in 1967. Had chest tube. Unsure etiology.   No family history on file.  Parents are deceased. Unknown medical issues.   Past Surgical History  Procedure Laterality Date  . Toe surgery      Social History   Social History  . Marital Status: Legally Separated    Spouse Name: N/A  . Number of Children: 0  . Years of Education: N/A   Occupational History  . attorney    Social History Main Topics  .  Smoking status: Former Smoker -- 1.00 packs/day for 5 years    Types: Cigarettes    Quit date: 11/24/1958  . Smokeless tobacco: Never Used  . Alcohol Use: No  . Drug Use: No  . Sexual Activity: Not on file   Other Topics Concern  . Not on file   Social History Narrative    Single, no children. Lives in [place]. Worked as an Forensic psychologist. (-) ETOH.   Allergies  Allergen Reactions  . Ciprocin-Fluocin-Procin [Fluocinolone Acetonide]   . Imiquimod   . Penicillins   . Quinolones   . Statins      Outpatient Prescriptions Prior to Visit  Medication Sig Dispense Refill  . amLODipine (NORVASC) 5 MG tablet Take 1 tablet (5 mg total) by mouth daily. 90 tablet 1  . aspirin 81 MG tablet Take 81 mg by mouth daily.      Marland Kitchen levothyroxine (SYNTHROID) 100 MCG tablet Take 110 mcg by mouth daily.     . diphenhydrAMINE (BENADRYL) 25 MG tablet Take 2 tablets (50 mg total) by mouth every 4 (four) hours as needed (swelling and itching). (Patient not taking: Reported on 01/09/2016) 20 tablet 0  . famotidine (PEPCID) 20 MG tablet Take 1 tablet (20 mg total) by mouth 2 (two) times daily. (Patient not taking: Reported on 01/09/2016) 30 tablet 0  .  hydrochlorothiazide (,MICROZIDE/HYDRODIURIL,) 12.5 MG capsule TAKE ONE CAPSULE BY MOUTH EVERY DAY (Patient not taking: Reported on 01/09/2016) 90 capsule 4  . niacin 500 MG tablet Take 500 mg by mouth daily with breakfast. Reported on 01/09/2016    . HYDROcodone-acetaminophen (NORCO/VICODIN) 5-325 MG per tablet Take 2 tablets by mouth every 4 (four) hours as needed for pain. 6 tablet 0  . nitrofurantoin, macrocrystal-monohydrate, (MACROBID) 100 MG capsule     . predniSONE (DELTASONE) 20 MG tablet Take 2 tablets (40 mg total) by mouth daily. 10 tablet 0   No facility-administered medications prior to visit.   Meds ordered this encounter  Medications  . lovastatin (MEVACOR) 10 MG tablet    Sig: Take 10 mg by mouth at bedtime.  . magnesium oxide (MAG-OX) 400 MG tablet    Sig:  Take 400 mg by mouth daily.  . Dutasteride-Tamsulosin HCl (JALYN) 0.5-0.4 MG CAPS    Sig: Take 1 tablet by mouth daily.  Marland Kitchen albuterol (PROVENTIL HFA;VENTOLIN HFA) 108 (90 Base) MCG/ACT inhaler    Sig: Inhale into the lungs every 6 (six) hours as needed for wheezing or shortness of breath.          Objective:   Physical Exam  Vitals:  Filed Vitals:   01/09/16 0935  BP: 112/68  Pulse: 60  Height: 6' (1.829 m)  Weight: 175 lb 6.4 oz (79.561 kg)  SpO2: 99%    Constitutional/General:  Pleasant, well-nourished, well-developed, not in any distress,  Comfortably seating.  Well kempt  Body mass index is 23.78 kg/(m^2). Wt Readings from Last 3 Encounters:  01/09/16 175 lb 6.4 oz (79.561 kg)  12/03/11 168 lb 6.4 oz (76.386 kg)  11/26/10 171 lb 8 oz (77.792 kg)    Neck circumference:   HEENT: Pupils equal and reactive to light and accommodation. Anicteric sclerae. Normal nasal mucosa.   No oral  lesions,  mouth clear,  oropharynx clear, no postnasal drip. (-) Oral thrush. No dental caries.  Airway - Mallampati class III  Neck: No masses. Midline trachea. No JVD, (-) LAD. (-) bruits appreciated.  Respiratory/Chest: Grossly normal chest. (-) deformity. (-) Accessory muscle use.  Symmetric expansion. (-) Tenderness on palpation.  Resonant on percussion.  Diminished BS on both lower lung zones. (-) wheezing, crackles, rhonchi (-) egophony  Cardiovascular: Regular rate and  rhythm, heart sounds normal, no murmur or gallops, no peripheral edema  Gastrointestinal:  Normal bowel sounds. Soft, non-tender. No hepatosplenomegaly.  (-) masses.   Musculoskeletal:  Normal muscle tone. Normal gait.   Extremities: Grossly normal. (-) clubbing, cyanosis.  (-) edema  Skin: (-) rash,lesions seen.   Neurological/Psychiatric : alert, oriented to time, place, person. Normal mood and affect           Assessment & Plan:   Lung nodule Patient likely with mild to moderate  COPD,10-pack-year smoking history, quit in mid 1960s.  Patient had a chest CT scan in July 2015 which showed a left lower lobe pulmonary nodule measuring 1 x 1.6 cm. He had a repeat chest CT scan in November 2015 which showed the nodule to be not as prominent. He had a follow-up chest CT scan in June of this year which showed increase in the nodule over at the left lower lobe, now measuring 2.3 x 1.9 cm.  I reviewed the images of the chest CT scan with the patient. On Coronal views, it appears the nodule is pleural-based and attached to the pleura near the margin of the heart. Initially I  thought the "nodule" was associated with the diaphragm. On further review, it appears that it's associated with a pleural lining on the left heart border.   Differentials : 1. Inflammatory. I tend to favor this.Initially nodule got smaller but now it's bigger after a year.  2. Malignancy. 3. Less likely infectious.  Plan : 1. PET scan > If positive, need to discuss with him. Difficult to biopsy because of location. i get the sense that patient wants to be conservative. 2. If PET scan is negative, plan for chest CT scan in 6 months or in December 2017.    COPD (chronic obstructive pulmonary disease) (HCC) Likely moderate to severe. SOB with more than ADLs. Got delirious with inhaled steroids. Currently on albuterol when necessary, uses maybe once a month. Her symptomatic, plan to start Spiriva. He states he is up-to-date with his vaccines.  SOB (shortness of breath) on exertion SOB with more than ADLs. Albuterol when necessary. We'll observe for now. May end up needing PFTs. May need Spiriva.         Thank you very much for letting me participate in this patient's care. Please do not hesitate to give me a call if you have any questions or concerns regarding the treatment plan.   Patient will follow up with me in after the PET scan.     Monica Becton, MD 01/09/2016   10:20  AM [facility] [place] Pager: [phone] Office: [phone], Fax: [phone]

## 2016-01-09 NOTE — Assessment & Plan Note (Signed)
Likely moderate to severe. SOB with more than ADLs. Got delirious with inhaled steroids. Currently on albuterol when necessary, uses maybe once a month. Her symptomatic, plan to start Spiriva. He states he is up-to-date with his vaccines.

## 2016-01-09 NOTE — Assessment & Plan Note (Signed)
Patient likely with mild to moderate COPD,10-pack-year smoking history, quit in mid 1960s.  Patient had a chest CT scan in July 2015 which showed a left lower lobe pulmonary nodule measuring 1 x 1.6 cm. He had a repeat chest CT scan in November 2015 which showed the nodule to be not as prominent. He had a follow-up chest CT scan in June of this year which showed increase in the nodule over at the left lower lobe, now measuring 2.3 x 1.9 cm.  I reviewed the images of the chest CT scan with the patient. On Coronal views, it appears the nodule is pleural-based and attached to the pleura near the margin of the heart. Initially I thought the "nodule" was associated with the diaphragm. On further review, it appears that it's associated with a pleural lining on the left heart border.   Differentials : 1. Inflammatory. I tend to favor this.Initially nodule got smaller but now it's bigger after a year.  2. Malignancy. 3. Less likely infectious.  Plan : 1. PET scan > If positive, need to discuss with him. Difficult to biopsy because of location. i get the sense that patient wants to be conservative. 2. If PET scan is negative, plan for chest CT scan in 6 months or in December 2017.

## 2016-01-09 NOTE — Assessment & Plan Note (Signed)
SOB with more than ADLs. Albuterol when necessary. We'll observe for now. May end up needing PFTs. May need Spiriva.

## 2016-01-14 ENCOUNTER — Encounter (HOSPITAL_COMMUNITY)
Admission: RE | Admit: 2016-01-14 | Discharge: 2016-01-14 | Disposition: A | Discharge status: Home / Self Care | Payer: Medicare PPO | Source: Ambulatory Visit | Attending: Pulmonary Disease | Admitting: Pulmonary Disease

## 2016-01-14 DIAGNOSIS — R938 Abnormal findings on diagnostic imaging of other specified body structures: Secondary | ICD-10-CM | POA: Insufficient documentation

## 2016-01-14 DIAGNOSIS — R9389 Abnormal findings on diagnostic imaging of other specified body structures: Secondary | ICD-10-CM

## 2016-01-14 LAB — GLUCOSE, CAPILLARY: Glucose-Capillary: 97 mg/dL (ref 65–99)

## 2016-01-14 MED ORDER — FLUDEOXYGLUCOSE F - 18 (FDG) INJECTION
9.7000 | Freq: Once | INTRAVENOUS | Status: AC | PRN
  Administered 2016-01-14: 9.7 via INTRAVENOUS

## 2016-01-15 ENCOUNTER — Other Ambulatory Visit: Payer: Self-pay | Admitting: *Deleted

## 2016-01-15 DIAGNOSIS — R9389 Abnormal findings on diagnostic imaging of other specified body structures: Secondary | ICD-10-CM

## 2016-01-29 ENCOUNTER — Encounter: Payer: Medicare PPO | Admitting: Cardiothoracic Surgery

## 2016-02-04 ENCOUNTER — Institutional Professional Consult (permissible substitution): Payer: Medicare PPO | Admitting: Pulmonary Disease

## 2016-02-05 ENCOUNTER — Encounter: Payer: Medicare PPO | Admitting: Surgery

## 2016-04-28 ENCOUNTER — Other Ambulatory Visit: Payer: Self-pay | Admitting: Internal Medicine

## 2016-04-28 DIAGNOSIS — R42 Dizziness and giddiness: Secondary | ICD-10-CM

## 2016-04-28 DIAGNOSIS — R0609 Other forms of dyspnea: Principal | ICD-10-CM

## 2016-05-06 ENCOUNTER — Ambulatory Visit
Admission: RE | Admit: 2016-05-06 | Discharge: 2016-05-06 | Disposition: A | Discharge status: Home / Self Care | Payer: Medicare PPO | Source: Ambulatory Visit | Attending: Internal Medicine | Admitting: Internal Medicine

## 2016-05-06 DIAGNOSIS — R42 Dizziness and giddiness: Secondary | ICD-10-CM

## 2016-05-06 DIAGNOSIS — R0609 Other forms of dyspnea: Principal | ICD-10-CM

## 2016-06-11 ENCOUNTER — Inpatient Hospital Stay: Admission: RE | Admit: 2016-06-11 | Payer: Medicare PPO | Source: Ambulatory Visit

## 2016-06-24 ENCOUNTER — Ambulatory Visit (INDEPENDENT_AMBULATORY_CARE_PROVIDER_SITE_OTHER)
Admission: RE | Admit: 2016-06-24 | Discharge: 2016-06-24 | Disposition: A | Discharge status: Home / Self Care | Payer: Medicare PPO | Source: Ambulatory Visit | Attending: Pulmonary Disease | Admitting: Pulmonary Disease

## 2016-06-24 DIAGNOSIS — R938 Abnormal findings on diagnostic imaging of other specified body structures: Secondary | ICD-10-CM

## 2016-06-24 DIAGNOSIS — R9389 Abnormal findings on diagnostic imaging of other specified body structures: Secondary | ICD-10-CM

## 2016-07-07 ENCOUNTER — Telehealth: Payer: Self-pay | Admitting: Pulmonary Disease

## 2016-07-07 NOTE — Telephone Encounter (Signed)
   Chest ct scan in 06/2016 showed stable LLL subpleural anterior nodule x 2 years per radiology report in 06/2016.  I called and updated pt.  No further chest ct scan needed.   Monica Becton, MD 07/07/2016, 4:28 PM [facility] Pager [phone] After 3 pm or if no answer, call [phone]

## 2016-09-22 DIAGNOSIS — Z08 Encounter for follow-up examination after completed treatment for malignant neoplasm: Secondary | ICD-10-CM | POA: Diagnosis not present

## 2016-09-22 DIAGNOSIS — B078 Other viral warts: Secondary | ICD-10-CM | POA: Diagnosis not present

## 2016-09-22 DIAGNOSIS — Z85828 Personal history of other malignant neoplasm of skin: Secondary | ICD-10-CM | POA: Diagnosis not present

## 2016-09-22 DIAGNOSIS — A630 Anogenital (venereal) warts: Secondary | ICD-10-CM | POA: Diagnosis not present

## 2016-10-06 DIAGNOSIS — J449 Chronic obstructive pulmonary disease, unspecified: Secondary | ICD-10-CM | POA: Diagnosis not present

## 2016-10-07 DIAGNOSIS — R319 Hematuria, unspecified: Secondary | ICD-10-CM | POA: Diagnosis not present

## 2016-10-07 DIAGNOSIS — N390 Urinary tract infection, site not specified: Secondary | ICD-10-CM | POA: Diagnosis not present

## 2016-10-22 DIAGNOSIS — J449 Chronic obstructive pulmonary disease, unspecified: Secondary | ICD-10-CM | POA: Diagnosis not present

## 2016-12-11 DIAGNOSIS — R7309 Other abnormal glucose: Secondary | ICD-10-CM | POA: Diagnosis not present

## 2016-12-11 DIAGNOSIS — E7800 Pure hypercholesterolemia, unspecified: Secondary | ICD-10-CM | POA: Diagnosis not present

## 2016-12-11 DIAGNOSIS — I10 Essential (primary) hypertension: Secondary | ICD-10-CM | POA: Diagnosis not present

## 2016-12-11 DIAGNOSIS — E039 Hypothyroidism, unspecified: Secondary | ICD-10-CM | POA: Diagnosis not present

## 2016-12-11 DIAGNOSIS — Z125 Encounter for screening for malignant neoplasm of prostate: Secondary | ICD-10-CM | POA: Diagnosis not present

## 2016-12-15 DIAGNOSIS — Z23 Encounter for immunization: Secondary | ICD-10-CM | POA: Diagnosis not present

## 2016-12-15 DIAGNOSIS — J449 Chronic obstructive pulmonary disease, unspecified: Secondary | ICD-10-CM | POA: Diagnosis not present

## 2016-12-15 DIAGNOSIS — Z0001 Encounter for general adult medical examination with abnormal findings: Secondary | ICD-10-CM | POA: Diagnosis not present

## 2016-12-15 DIAGNOSIS — E039 Hypothyroidism, unspecified: Secondary | ICD-10-CM | POA: Diagnosis not present

## 2016-12-15 DIAGNOSIS — E7800 Pure hypercholesterolemia, unspecified: Secondary | ICD-10-CM | POA: Diagnosis not present

## 2016-12-17 DIAGNOSIS — Z0000 Encounter for general adult medical examination without abnormal findings: Secondary | ICD-10-CM | POA: Diagnosis not present

## 2016-12-17 DIAGNOSIS — M25562 Pain in left knee: Secondary | ICD-10-CM | POA: Diagnosis not present

## 2017-01-04 DIAGNOSIS — J449 Chronic obstructive pulmonary disease, unspecified: Secondary | ICD-10-CM | POA: Diagnosis not present

## 2017-01-04 DIAGNOSIS — F5101 Primary insomnia: Secondary | ICD-10-CM | POA: Diagnosis not present

## 2017-01-08 DIAGNOSIS — R828 Abnormal findings on cytological and histological examination of urine: Secondary | ICD-10-CM | POA: Diagnosis not present

## 2017-01-08 DIAGNOSIS — N429 Disorder of prostate, unspecified: Secondary | ICD-10-CM | POA: Diagnosis not present

## 2017-01-08 DIAGNOSIS — R310 Gross hematuria: Secondary | ICD-10-CM | POA: Diagnosis not present

## 2017-01-08 DIAGNOSIS — R8290 Unspecified abnormal findings in urine: Secondary | ICD-10-CM | POA: Diagnosis not present

## 2017-01-13 DIAGNOSIS — C44311 Basal cell carcinoma of skin of nose: Secondary | ICD-10-CM | POA: Diagnosis not present

## 2017-01-13 DIAGNOSIS — Z1283 Encounter for screening for malignant neoplasm of skin: Secondary | ICD-10-CM | POA: Diagnosis not present

## 2017-01-13 DIAGNOSIS — L821 Other seborrheic keratosis: Secondary | ICD-10-CM | POA: Diagnosis not present

## 2017-01-13 DIAGNOSIS — L820 Inflamed seborrheic keratosis: Secondary | ICD-10-CM | POA: Diagnosis not present

## 2017-01-14 DIAGNOSIS — J449 Chronic obstructive pulmonary disease, unspecified: Secondary | ICD-10-CM | POA: Diagnosis not present

## 2017-02-05 DIAGNOSIS — N138 Other obstructive and reflux uropathy: Secondary | ICD-10-CM | POA: Diagnosis not present

## 2017-02-05 DIAGNOSIS — R972 Elevated prostate specific antigen [PSA]: Secondary | ICD-10-CM | POA: Diagnosis not present

## 2017-02-05 DIAGNOSIS — N401 Enlarged prostate with lower urinary tract symptoms: Secondary | ICD-10-CM | POA: Diagnosis not present

## 2017-02-05 DIAGNOSIS — N529 Male erectile dysfunction, unspecified: Secondary | ICD-10-CM | POA: Diagnosis not present

## 2017-02-24 DIAGNOSIS — Z08 Encounter for follow-up examination after completed treatment for malignant neoplasm: Secondary | ICD-10-CM | POA: Diagnosis not present

## 2017-02-24 DIAGNOSIS — L821 Other seborrheic keratosis: Secondary | ICD-10-CM | POA: Diagnosis not present

## 2017-02-24 DIAGNOSIS — Z85828 Personal history of other malignant neoplasm of skin: Secondary | ICD-10-CM | POA: Diagnosis not present

## 2017-03-16 DIAGNOSIS — R358 Other polyuria: Secondary | ICD-10-CM | POA: Diagnosis not present

## 2017-03-16 DIAGNOSIS — R300 Dysuria: Secondary | ICD-10-CM | POA: Diagnosis not present

## 2017-03-16 DIAGNOSIS — N390 Urinary tract infection, site not specified: Secondary | ICD-10-CM | POA: Diagnosis not present

## 2017-03-16 DIAGNOSIS — N411 Chronic prostatitis: Secondary | ICD-10-CM | POA: Diagnosis not present

## 2017-03-17 DIAGNOSIS — Z961 Presence of intraocular lens: Secondary | ICD-10-CM | POA: Diagnosis not present

## 2017-03-17 DIAGNOSIS — H43812 Vitreous degeneration, left eye: Secondary | ICD-10-CM | POA: Diagnosis not present

## 2017-03-17 DIAGNOSIS — H35052 Retinal neovascularization, unspecified, left eye: Secondary | ICD-10-CM | POA: Diagnosis not present

## 2017-03-17 DIAGNOSIS — H3562 Retinal hemorrhage, left eye: Secondary | ICD-10-CM | POA: Diagnosis not present

## 2017-04-12 DIAGNOSIS — G8929 Other chronic pain: Secondary | ICD-10-CM | POA: Diagnosis not present

## 2017-04-12 DIAGNOSIS — M25562 Pain in left knee: Secondary | ICD-10-CM | POA: Diagnosis not present

## 2017-04-12 DIAGNOSIS — M1712 Unilateral primary osteoarthritis, left knee: Secondary | ICD-10-CM | POA: Diagnosis not present

## 2017-04-21 DIAGNOSIS — N390 Urinary tract infection, site not specified: Secondary | ICD-10-CM | POA: Diagnosis not present

## 2017-04-21 DIAGNOSIS — N411 Chronic prostatitis: Secondary | ICD-10-CM | POA: Diagnosis not present

## 2017-06-02 DIAGNOSIS — E7800 Pure hypercholesterolemia, unspecified: Secondary | ICD-10-CM | POA: Diagnosis not present

## 2017-06-02 DIAGNOSIS — R0602 Shortness of breath: Secondary | ICD-10-CM | POA: Diagnosis not present

## 2017-06-02 DIAGNOSIS — I10 Essential (primary) hypertension: Secondary | ICD-10-CM | POA: Diagnosis not present

## 2017-06-11 DIAGNOSIS — N401 Enlarged prostate with lower urinary tract symptoms: Secondary | ICD-10-CM | POA: Diagnosis not present

## 2017-06-11 DIAGNOSIS — N529 Male erectile dysfunction, unspecified: Secondary | ICD-10-CM | POA: Diagnosis not present

## 2017-06-11 DIAGNOSIS — N320 Bladder-neck obstruction: Secondary | ICD-10-CM | POA: Diagnosis not present

## 2017-06-11 DIAGNOSIS — R972 Elevated prostate specific antigen [PSA]: Secondary | ICD-10-CM | POA: Diagnosis not present

## 2017-07-22 DIAGNOSIS — I10 Essential (primary) hypertension: Secondary | ICD-10-CM | POA: Diagnosis not present

## 2017-07-22 DIAGNOSIS — J449 Chronic obstructive pulmonary disease, unspecified: Secondary | ICD-10-CM | POA: Diagnosis not present

## 2017-08-06 DIAGNOSIS — N528 Other male erectile dysfunction: Secondary | ICD-10-CM | POA: Diagnosis not present

## 2017-08-06 DIAGNOSIS — R972 Elevated prostate specific antigen [PSA]: Secondary | ICD-10-CM | POA: Diagnosis not present

## 2017-08-06 DIAGNOSIS — J449 Chronic obstructive pulmonary disease, unspecified: Secondary | ICD-10-CM | POA: Diagnosis not present

## 2017-08-06 DIAGNOSIS — N401 Enlarged prostate with lower urinary tract symptoms: Secondary | ICD-10-CM | POA: Diagnosis not present

## 2017-08-23 DIAGNOSIS — M1712 Unilateral primary osteoarthritis, left knee: Secondary | ICD-10-CM | POA: Diagnosis not present

## 2017-09-01 DIAGNOSIS — M1712 Unilateral primary osteoarthritis, left knee: Secondary | ICD-10-CM | POA: Diagnosis not present

## 2017-09-01 DIAGNOSIS — M25562 Pain in left knee: Secondary | ICD-10-CM | POA: Diagnosis not present

## 2017-09-02 DIAGNOSIS — M1712 Unilateral primary osteoarthritis, left knee: Secondary | ICD-10-CM | POA: Diagnosis not present

## 2017-09-10 ENCOUNTER — Ambulatory Visit: Payer: Self-pay | Admitting: Orthopedic Surgery

## 2017-09-29 ENCOUNTER — Ambulatory Visit: Payer: Self-pay | Admitting: Orthopedic Surgery

## 2017-10-01 ENCOUNTER — Ambulatory Visit: Payer: Self-pay | Admitting: Orthopedic Surgery

## 2017-10-01 ENCOUNTER — Encounter (HOSPITAL_COMMUNITY): Payer: Self-pay

## 2017-10-01 NOTE — Patient Instructions (Addendum)
BANNER HUCKABA  10/01/2017   Your procedure is scheduled on: Wednesday 10/06/2017  Report to [facility] Main  Entrance              Report to admitting at  [place] AM     Call this number if you have problems the morning of surgery [phone]     Remember: Do not eat food or drink liquids :After Midnight.     Take these medicines the morning of surgery with A SIP OF WATER: Amlodipine (Norvasc), Levothyroxine (Synthroid), Omeprazole (Prilosec), Lovastatin (Mevacor), use Spiriva Respimat inhaler and bring it with you to the             hospital                                You may not have any metal on your body including hair pins and              piercings  Do not wear jewelry, make-up, lotions, powders or perfumes, deodorant             Do not wear nail polish.  Do not shave  48 hours prior to surgery.              Men may shave face and neck.   Do not bring valuables to the hospital. [place].  Contacts, dentures or bridgework may not be worn into surgery.  Leave suitcase in the car. After surgery it may be brought to your room.                Please read over the following fact sheets you were given: _____________________________________________________________________             [facility] - Preparing for Surgery Before surgery, you can play an important role.  Because skin is not sterile, your skin needs to be as free of germs as possible.  You can reduce the number of germs on your skin by washing with CHG (chlorahexidine gluconate) soap before surgery.  CHG is an antiseptic cleaner which kills germs and bonds with the skin to continue killing germs even after washing. Please DO NOT use if you have an allergy to CHG or antibacterial soaps.  If your skin becomes reddened/irritated stop using the CHG and inform your nurse when you arrive at Short Stay. Do not shave (including legs and underarms)  for at least 48 hours prior to the first CHG shower.  You may shave your face/neck. Please follow these instructions carefully:  1.  Shower with CHG Soap the night before surgery and the  morning of Surgery.  2.  If you choose to wash your hair, wash your hair first as usual with your  normal  shampoo.  3.  After you shampoo, rinse your hair and body thoroughly to remove the  shampoo.                           4.  Use CHG as you would any other liquid soap.  You can apply chg directly  to the skin and wash  Gently with a scrungie or clean washcloth.  5.  Apply the CHG Soap to your body ONLY FROM THE NECK DOWN.   Do not use on face/ open                           Wound or open sores. Avoid contact with eyes, ears mouth and genitals (private parts).                       Wash face,  Genitals (private parts) with your normal soap.             6.  Wash thoroughly, paying special attention to the area where your surgery  will be performed.  7.  Thoroughly rinse your body with warm water from the neck down.  8.  DO NOT shower/wash with your normal soap after using and rinsing off  the CHG Soap.                9.  Pat yourself dry with a clean towel.            10.  Wear clean pajamas.            11.  Place clean sheets on your bed the night of your first shower and do not  sleep with pets. Day of Surgery : Do not apply any lotions/deodorants the morning of surgery.  Please wear clean clothes to the hospital/surgery center.  FAILURE TO FOLLOW THESE INSTRUCTIONS MAY RESULT IN THE CANCELLATION OF YOUR SURGERY PATIENT SIGNATURE_________________________________  NURSE SIGNATURE__________________________________  ________________________________________________________________________   Adam Phenix  An incentive spirometer is a tool that can help keep your lungs clear and active. This tool measures how well you are filling your lungs with each breath. Taking long deep  breaths may help reverse or decrease the chance of developing breathing (pulmonary) problems (especially infection) following:  A long period of time when you are unable to move or be active. BEFORE THE PROCEDURE   If the spirometer includes an indicator to show your best effort, your nurse or respiratory therapist will set it to a desired goal.  If possible, sit up straight or lean slightly forward. Try not to slouch.  Hold the incentive spirometer in an upright position. INSTRUCTIONS FOR USE  1. Sit on the edge of your bed if possible, or sit up as far as you can in bed or on a chair. 2. Hold the incentive spirometer in an upright position. 3. Breathe out normally. 4. Place the mouthpiece in your mouth and seal your lips tightly around it. 5. Breathe in slowly and as deeply as possible, raising the piston or the ball toward the top of the column. 6. Hold your breath for 3-5 seconds or for as long as possible. Allow the piston or ball to fall to the bottom of the column. 7. Remove the mouthpiece from your mouth and breathe out normally. 8. Rest for a few seconds and repeat Steps 1 through 7 at least 10 times every 1-2 hours when you are awake. Take your time and take a few normal breaths between deep breaths. 9. The spirometer may include an indicator to show your best effort. Use the indicator as a goal to work toward during each repetition. 10. After each set of 10 deep breaths, practice coughing to be sure your lungs are clear. If you have an incision (the cut made at the time of surgery),  support your incision when coughing by placing a pillow or rolled up towels firmly against it. Once you are able to get out of bed, walk around indoors and cough well. You may stop using the incentive spirometer when instructed by your caregiver.  RISKS AND COMPLICATIONS  Take your time so you do not get dizzy or light-headed.  If you are in pain, you may need to take or ask for pain medication before  doing incentive spirometry. It is harder to take a deep breath if you are having pain. AFTER USE  Rest and breathe slowly and easily.  It can be helpful to keep track of a log of your progress. Your caregiver can provide you with a simple table to help with this. If you are using the spirometer at home, follow these instructions: [place] IF:   You are having difficultly using the spirometer.  You have trouble using the spirometer as often as instructed.  Your pain medication is not giving enough relief while using the spirometer.  You develop fever of 100.5 F (38.1 C) or higher. SEEK IMMEDIATE MEDICAL CARE IF:   You cough up bloody sputum that had not been present before.  You develop fever of 102 F (38.9 C) or greater.  You develop worsening pain at or near the incision site. MAKE SURE YOU:   Understand these instructions.  Will watch your condition.  Will get help right away if you are not doing well or get worse. Document Released: 11/02/2006 Document Revised: 09/14/2011 Document Reviewed: 01/03/2007 Exit[employer] Patient Information 2014 [employer], [place].   ________________________________________________________________________  WHAT IS A BLOOD TRANSFUSION? Blood Transfusion Information  A transfusion is the replacement of blood or some of its parts. Blood is made up of multiple cells which provide different functions.  Red blood cells carry oxygen and are used for blood loss replacement.  White blood cells fight against infection.  Platelets control bleeding.  Plasma helps clot blood.  Other blood products are available for specialized needs, such as hemophilia or other clotting disorders. BEFORE THE TRANSFUSION  Who gives blood for transfusions?   Healthy volunteers who are fully evaluated to make sure their blood is safe. This is blood bank blood. Transfusion therapy is the safest it has ever been in the practice of medicine. Before blood is taken  from a donor, a complete history is taken to make sure that person has no history of diseases nor engages in risky social behavior (examples are intravenous drug use or sexual activity with multiple partners). The donor's travel history is screened to minimize risk of transmitting infections, such as malaria. The donated blood is tested for signs of infectious diseases, such as HIV and hepatitis. The blood is then tested to be sure it is compatible with you in order to minimize the chance of a transfusion reaction. If you or a relative donates blood, this is often done in anticipation of surgery and is not appropriate for emergency situations. It takes many days to process the donated blood. RISKS AND COMPLICATIONS Although transfusion therapy is very safe and saves many lives, the main dangers of transfusion include:   Getting an infectious disease.  Developing a transfusion reaction. This is an allergic reaction to something in the blood you were given. Every precaution is taken to prevent this. The decision to have a blood transfusion has been considered carefully by your caregiver before blood is given. Blood is not given unless the benefits outweigh the risks. AFTER THE TRANSFUSION  Right after receiving a blood transfusion, you will usually feel much better and more energetic. This is especially true if your red blood cells have gotten low (anemic). The transfusion raises the level of the red blood cells which carry oxygen, and this usually causes an energy increase.  The nurse administering the transfusion will monitor you carefully for complications. HOME CARE INSTRUCTIONS  No special instructions are needed after a transfusion. You may find your energy is better. Speak with your caregiver about any limitations on activity for underlying diseases you may have. SEEK MEDICAL CARE IF:   Your condition is not improving after your transfusion.  You develop redness or irritation at the  intravenous (IV) site. SEEK IMMEDIATE MEDICAL CARE IF:  Any of the following symptoms occur over the next 12 hours:  Shaking chills.  You have a temperature by mouth above 102 F (38.9 C), not controlled by medicine.  Chest, back, or muscle pain.  People around you feel you are not acting correctly or are confused.  Shortness of breath or difficulty breathing.  Dizziness and fainting.  You get a rash or develop hives.  You have a decrease in urine output.  Your urine turns a dark color or changes to pink, red, or brown. Any of the following symptoms occur over the next 10 days:  You have a temperature by mouth above 102 F (38.9 C), not controlled by medicine.  Shortness of breath.  Weakness after normal activity.  The white part of the eye turns yellow (jaundice).  You have a decrease in the amount of urine or are urinating less often.  Your urine turns a dark color or changes to pink, red, or brown. Document Released: 06/19/2000 Document Revised: 09/14/2011 Document Reviewed: 02/06/2008 [facility] Patient Information 2014 [place], [place].  _______________________________________________________________________

## 2017-10-01 NOTE — H&P (Signed)
Joseph [name] is an 83 y.o. male.   Chief Complaint: L knee pain HPI: Joseph [name] presents today for preoperative history and physical. He is scheduled for left total knee replacement by Dr. Tonita Cong on April 3rd at [facility].  He reports chronic pain in the left knee which has progressively worsened and is interfering with activities of daily living and quality-of-life. It is refractory to conservative treatment including injection therapy, bracing, medications, activity modifications and relative rest. He desires to proceed with surgery.  Dr. Tonita Cong and the patient have mutually agreed to proceed with a total knee replacement. Risks and benefits of the procedure were discussed including stiffness, suboptimal range of motion, persistent pain, infection requiring removal of prosthesis and reinsertion, need for prophylactic antibiotics in the future, for example, dental procedures, possible need for manipulation, revision in the future and also anesthetic complications including DVT, PE, etc. We discussed the perioperative course, time in the hospital, postoperative recovery and the need for elevation to control swelling. We also discussed the predicted range of motion and the probability that squatting and kneeling would be unobtainable in the future. In addition, postoperative anticoagulation was discussed. We have obtained preoperative medical clearance as necessary. Provided illustrated handout and discussed it in detail. They will enroll in the total joint replacement educational forum at the hospital.  He was cleared by Dr. Shelia Media preoperatively. He does live alone and lives in a two-level house but can stay on the first level. He reports nausea and vomiting with morphine in the past. He has an allergy to both penicillin and Cipro, both with a rash.  Past Medical History:  Diagnosis Date  . Hyperlipidemia   . Hypertension   . Pneumothorax, spontaneous, tension 1960  . SOB (shortness of  breath) on exertion     Past Surgical History:  Procedure Laterality Date  . TOE SURGERY     Medications amLODIPine 5 mg tabletS aspirin levothyroxine 125 mcg tablet lovastatin 40 mg tablet Spiriva Respimat 2.5 mcg/actuation solution for inhalation antacid  No family history on file. Social History:  reports that he quit smoking about 58 years ago. His smoking use included cigarettes. He has a 5.00 pack-year smoking history. He has never used smokeless tobacco. He reports that he does not drink alcohol or use drugs.  Allergies:  Allergies  Allergen Reactions  . Ciprocin-Fluocin-Procin [Fluocinolone Acetonide] Other (See Comments)    Blisters  . Quinolones Other (See Comments)    Pt had to go to hospital  . Statins Other (See Comments)    Legs hurt  . Imiquimod Other (See Comments)    Blisters  . Penicillins Itching, Rash and Other (See Comments)    Has patient had a PCN reaction causing immediate rash, facial/tongue/throat swelling, SOB or lightheadedness with hypotension: Yes Has patient had a PCN reaction causing severe rash involving mucus membranes or skin necrosis: No Has patient had a PCN reaction that required hospitalization: No Has patient had a PCN reaction occurring within the last 10 years: No If all of the above answers are "NO", then may proceed with Cephalosporin use.      (Not in a hospital admission)  No results found for this or any previous visit (from the past 48 hour(s)). No results found.  Review of Systems  Constitutional: Negative.   HENT: Negative.   Eyes: Negative.   Respiratory: Negative.   Cardiovascular: Negative.   Gastrointestinal: Negative.   Genitourinary: Negative.   Musculoskeletal: Positive for joint pain.  Skin: Negative.  Neurological: Negative.   Psychiatric/Behavioral: Negative.     There were no vitals taken for this visit. Physical Exam  Constitutional: He is oriented to person, place, and time. He appears  well-developed.  HENT:  Head: Normocephalic.  Eyes: Pupils are equal, round, and reactive to light.  Neck: Normal range of motion.  Cardiovascular: Normal rate.  Respiratory: Effort normal.  GI: Soft.  Musculoskeletal:  Awake, alert, oriented 3. Well-nourished and well-developed. Slightly antalgic gait.  Left knee mild effusion, tender medial joint line. Range of motion approximately 0-130. No pain or laxity with varus or valgus stress. No instability. No calf pain or sign of DVT.  Neurological: He is alert and oriented to person, place, and time.    Prior x-rays reviewed with end-stage degenerative changes left knee medial compartment with slight varus deformity.  Assessment/Plan Impression: Left knee osteoarthritis  Plan: Pt with end-stage Left knee DJD, bone-on-bone, refractory to conservative tx, scheduled for Left total knee replacement by Dr. Tonita Cong on April 3. We again discussed the procedure itself as well as risks, complications and alternatives, including but not limited to DVT, PE, infx, bleeding, failure of procedure, need for secondary procedure including manipulation, nerve injury, ongoing pain/symptoms, anesthesia risk, even stroke or death. Also discussed typical post-op protocols, activity restrictions, need for PT, flexion/extension exercises, time out of work. Discussed need for DVT ppx post-op per protocol. Discussed dental ppx and infx prevention. Also discussed limitations post-operatively such as kneeling and squatting. All questions were answered. Patient desires to proceed with surgery as scheduled.  Will hold supplements, ASA and NSAIDs accordingly. Will remain NPO after midnight the night before surgery. Will present to [facility] for pre-op testing. Anticipate hospital stay to include at least 2 midnights given medical history and to ensure proper pain control. Plan ASA 325mg  BID for DVT ppx post-op. We discussed avoiding travel initially post-op due to risk of DVT. He has  inquired about taking Tylenol for pain postoperatively and I told him he will likely require some type of narcotic on top of that, or at least Tramadol. We can determine the appropriate medication in the hospital for him based upon pain control. Also will use Colace, Miralax. He would like to return home post-op but he lives alone in a two story house (has ability to stay on one level). We did previously discuss possible need for a short rehab stay, as well as home PT option and can determine this based upon his progress. We discussed the importance of icing, elevating, controlling swelling, doing PT, possible need for manipulation, risk of DVT. Will follow up 10-14 days post-op for staple removal and xrays.  Anticipated LOS equal to or greater than 2 midnights due to - Age 65 and older with one or more of the following:  - Obesity  - Expected need for hospital services (PT, OT, Nursing) required for safe  discharge  - Anticipated need for postoperative skilled nursing care or inpatient rehab  - Active co-morbidities: None   Plan L total knee replacement  Cecilie Kicks., PA-C for Dr. Tonita Cong 10/01/2017, 11:27 AM

## 2017-10-01 NOTE — H&P (View-Only) (Signed)
Joseph [name] is an 83 y.o. male.   Chief Complaint: L knee pain HPI: Joseph [name] presents today for preoperative history and physical. He is scheduled for left total knee replacement by Dr. Tonita Cong on April 3rd at [facility].  He reports chronic pain in the left knee which has progressively worsened and is interfering with activities of daily living and quality-of-life. It is refractory to conservative treatment including injection therapy, bracing, medications, activity modifications and relative rest. He desires to proceed with surgery.  Dr. Tonita Cong and the patient have mutually agreed to proceed with a total knee replacement. Risks and benefits of the procedure were discussed including stiffness, suboptimal range of motion, persistent pain, infection requiring removal of prosthesis and reinsertion, need for prophylactic antibiotics in the future, for example, dental procedures, possible need for manipulation, revision in the future and also anesthetic complications including DVT, PE, etc. We discussed the perioperative course, time in the hospital, postoperative recovery and the need for elevation to control swelling. We also discussed the predicted range of motion and the probability that squatting and kneeling would be unobtainable in the future. In addition, postoperative anticoagulation was discussed. We have obtained preoperative medical clearance as necessary. Provided illustrated handout and discussed it in detail. They will enroll in the total joint replacement educational forum at the hospital.  He was cleared by Dr. Shelia Media preoperatively. He does live alone and lives in a two-level house but can stay on the first level. He reports nausea and vomiting with morphine in the past. He has an allergy to both penicillin and Cipro, both with a rash.  Past Medical History:  Diagnosis Date  . Hyperlipidemia   . Hypertension   . Pneumothorax, spontaneous, tension 1960  . SOB (shortness of  breath) on exertion     Past Surgical History:  Procedure Laterality Date  . TOE SURGERY     Medications amLODIPine 5 mg tabletS aspirin levothyroxine 125 mcg tablet lovastatin 40 mg tablet Spiriva Respimat 2.5 mcg/actuation solution for inhalation antacid  No family history on file. Social History:  reports that he quit smoking about 58 years ago. His smoking use included cigarettes. He has a 5.00 pack-year smoking history. He has never used smokeless tobacco. He reports that he does not drink alcohol or use drugs.  Allergies:  Allergies  Allergen Reactions  . Ciprocin-Fluocin-Procin [Fluocinolone Acetonide] Other (See Comments)    Blisters  . Quinolones Other (See Comments)    Pt had to go to hospital  . Statins Other (See Comments)    Legs hurt  . Imiquimod Other (See Comments)    Blisters  . Penicillins Itching, Rash and Other (See Comments)    Has patient had a PCN reaction causing immediate rash, facial/tongue/throat swelling, SOB or lightheadedness with hypotension: Yes Has patient had a PCN reaction causing severe rash involving mucus membranes or skin necrosis: No Has patient had a PCN reaction that required hospitalization: No Has patient had a PCN reaction occurring within the last 10 years: No If all of the above answers are "NO", then may proceed with Cephalosporin use.      (Not in a hospital admission)  No results found for this or any previous visit (from the past 48 hour(s)). No results found.  Review of Systems  Constitutional: Negative.   HENT: Negative.   Eyes: Negative.   Respiratory: Negative.   Cardiovascular: Negative.   Gastrointestinal: Negative.   Genitourinary: Negative.   Musculoskeletal: Positive for joint pain.  Skin: Negative.  Neurological: Negative.   Psychiatric/Behavioral: Negative.     There were no vitals taken for this visit. Physical Exam  Constitutional: He is oriented to person, place, and time. He appears  well-developed.  HENT:  Head: Normocephalic.  Eyes: Pupils are equal, round, and reactive to light.  Neck: Normal range of motion.  Cardiovascular: Normal rate.  Respiratory: Effort normal.  GI: Soft.  Musculoskeletal:  Awake, alert, oriented 3. Well-nourished and well-developed. Slightly antalgic gait.  Left knee mild effusion, tender medial joint line. Range of motion approximately 0-130. No pain or laxity with varus or valgus stress. No instability. No calf pain or sign of DVT.  Neurological: He is alert and oriented to person, place, and time.    Prior x-rays reviewed with end-stage degenerative changes left knee medial compartment with slight varus deformity.  Assessment/Plan Impression: Left knee osteoarthritis  Plan: Pt with end-stage Left knee DJD, bone-on-bone, refractory to conservative tx, scheduled for Left total knee replacement by Dr. Tonita Cong on April 3. We again discussed the procedure itself as well as risks, complications and alternatives, including but not limited to DVT, PE, infx, bleeding, failure of procedure, need for secondary procedure including manipulation, nerve injury, ongoing pain/symptoms, anesthesia risk, even stroke or death. Also discussed typical post-op protocols, activity restrictions, need for PT, flexion/extension exercises, time out of work. Discussed need for DVT ppx post-op per protocol. Discussed dental ppx and infx prevention. Also discussed limitations post-operatively such as kneeling and squatting. All questions were answered. Patient desires to proceed with surgery as scheduled.  Will hold supplements, ASA and NSAIDs accordingly. Will remain NPO after midnight the night before surgery. Will present to [facility] for pre-op testing. Anticipate hospital stay to include at least 2 midnights given medical history and to ensure proper pain control. Plan ASA 325mg  BID for DVT ppx post-op. We discussed avoiding travel initially post-op due to risk of DVT. He has  inquired about taking Tylenol for pain postoperatively and I told him he will likely require some type of narcotic on top of that, or at least Tramadol. We can determine the appropriate medication in the hospital for him based upon pain control. Also will use Colace, Miralax. He would like to return home post-op but he lives alone in a two story house (has ability to stay on one level). We did previously discuss possible need for a short rehab stay, as well as home PT option and can determine this based upon his progress. We discussed the importance of icing, elevating, controlling swelling, doing PT, possible need for manipulation, risk of DVT. Will follow up 10-14 days post-op for staple removal and xrays.  Anticipated LOS equal to or greater than 2 midnights due to - Age 13 and older with one or more of the following:  - Obesity  - Expected need for hospital services (PT, OT, Nursing) required for safe  discharge  - Anticipated need for postoperative skilled nursing care or inpatient rehab  - Active co-morbidities: None   Plan L total knee replacement  Cecilie Kicks., PA-C for Dr. Tonita Cong 10/01/2017, 11:27 AM

## 2017-10-04 ENCOUNTER — Encounter (HOSPITAL_COMMUNITY)
Admission: RE | Admit: 2017-10-04 | Discharge: 2017-10-04 | Disposition: A | Discharge status: Home / Self Care | Payer: PPO | Source: Ambulatory Visit | Attending: Specialist | Admitting: Specialist

## 2017-10-04 ENCOUNTER — Encounter (HOSPITAL_COMMUNITY): Payer: Self-pay

## 2017-10-04 ENCOUNTER — Other Ambulatory Visit: Payer: Self-pay

## 2017-10-04 DIAGNOSIS — Z01812 Encounter for preprocedural laboratory examination: Secondary | ICD-10-CM | POA: Insufficient documentation

## 2017-10-04 DIAGNOSIS — Z471 Aftercare following joint replacement surgery: Secondary | ICD-10-CM | POA: Diagnosis not present

## 2017-10-04 DIAGNOSIS — I10 Essential (primary) hypertension: Secondary | ICD-10-CM

## 2017-10-04 DIAGNOSIS — Z01810 Encounter for preprocedural cardiovascular examination: Secondary | ICD-10-CM

## 2017-10-04 DIAGNOSIS — S8002XA Contusion of left knee, initial encounter: Secondary | ICD-10-CM | POA: Diagnosis not present

## 2017-10-04 DIAGNOSIS — J930 Spontaneous tension pneumothorax: Secondary | ICD-10-CM | POA: Diagnosis not present

## 2017-10-04 DIAGNOSIS — Z87891 Personal history of nicotine dependence: Secondary | ICD-10-CM | POA: Diagnosis not present

## 2017-10-04 DIAGNOSIS — G8911 Acute pain due to trauma: Secondary | ICD-10-CM | POA: Diagnosis not present

## 2017-10-04 DIAGNOSIS — M1712 Unilateral primary osteoarthritis, left knee: Secondary | ICD-10-CM | POA: Diagnosis not present

## 2017-10-04 DIAGNOSIS — E785 Hyperlipidemia, unspecified: Secondary | ICD-10-CM | POA: Diagnosis not present

## 2017-10-04 DIAGNOSIS — R488 Other symbolic dysfunctions: Secondary | ICD-10-CM | POA: Diagnosis not present

## 2017-10-04 DIAGNOSIS — R278 Other lack of coordination: Secondary | ICD-10-CM | POA: Diagnosis not present

## 2017-10-04 DIAGNOSIS — E669 Obesity, unspecified: Secondary | ICD-10-CM | POA: Diagnosis not present

## 2017-10-04 DIAGNOSIS — J449 Chronic obstructive pulmonary disease, unspecified: Secondary | ICD-10-CM | POA: Diagnosis not present

## 2017-10-04 DIAGNOSIS — Z6824 Body mass index (BMI) 24.0-24.9, adult: Secondary | ICD-10-CM | POA: Diagnosis not present

## 2017-10-04 DIAGNOSIS — Z96652 Presence of left artificial knee joint: Secondary | ICD-10-CM | POA: Diagnosis not present

## 2017-10-04 DIAGNOSIS — Z880 Allergy status to penicillin: Secondary | ICD-10-CM | POA: Diagnosis not present

## 2017-10-04 DIAGNOSIS — M6281 Muscle weakness (generalized): Secondary | ICD-10-CM | POA: Diagnosis not present

## 2017-10-04 DIAGNOSIS — G8918 Other acute postprocedural pain: Secondary | ICD-10-CM | POA: Diagnosis not present

## 2017-10-04 DIAGNOSIS — Z888 Allergy status to other drugs, medicaments and biological substances status: Secondary | ICD-10-CM | POA: Diagnosis not present

## 2017-10-04 DIAGNOSIS — Z79899 Other long term (current) drug therapy: Secondary | ICD-10-CM | POA: Diagnosis not present

## 2017-10-04 HISTORY — DX: Unspecified osteoarthritis, unspecified site: M19.90

## 2017-10-04 HISTORY — DX: Dyspnea, unspecified: R06.00

## 2017-10-04 HISTORY — DX: Chronic obstructive pulmonary disease, unspecified: J44.9

## 2017-10-04 LAB — URINALYSIS, ROUTINE W REFLEX MICROSCOPIC
BILIRUBIN URINE: NEGATIVE
Glucose, UA: NEGATIVE mg/dL
HGB URINE DIPSTICK: NEGATIVE
Ketones, ur: NEGATIVE mg/dL
Nitrite: NEGATIVE
Protein, ur: NEGATIVE mg/dL
SPECIFIC GRAVITY, URINE: 1.019 (ref 1.005–1.030)
pH: 5 (ref 5.0–8.0)

## 2017-10-04 LAB — CBC
HEMATOCRIT: 48.3 % (ref 39.0–52.0)
HEMOGLOBIN: 16 g/dL (ref 13.0–17.0)
MCH: 30.1 pg (ref 26.0–34.0)
MCHC: 33.1 g/dL (ref 30.0–36.0)
MCV: 90.8 fL (ref 78.0–100.0)
Platelets: 170 10*3/uL (ref 150–400)
RBC: 5.32 MIL/uL (ref 4.22–5.81)
RDW: 14.2 % (ref 11.5–15.5)
WBC: 7.5 10*3/uL (ref 4.0–10.5)

## 2017-10-04 LAB — BASIC METABOLIC PANEL
Anion gap: 7 (ref 5–15)
BUN: 14 mg/dL (ref 6–20)
CHLORIDE: 104 mmol/L (ref 101–111)
CO2: 29 mmol/L (ref 22–32)
Calcium: 9.6 mg/dL (ref 8.9–10.3)
Creatinine, Ser: 0.93 mg/dL (ref 0.61–1.24)
GFR calc Af Amer: >60 mL/min (ref >60)
GFR calc non Af Amer: >60 mL/min (ref >60)
GLUCOSE: 97 mg/dL (ref 65–99)
POTASSIUM: 4.1 mmol/L (ref 3.5–5.1)
Sodium: 140 mmol/L (ref 135–145)

## 2017-10-04 LAB — PROTIME-INR
INR: 1.09
Prothrombin Time: 14 seconds (ref 11.4–15.2)

## 2017-10-04 LAB — SURGICAL PCR SCREEN
MRSA, PCR: NEGATIVE
Staphylococcus aureus: NEGATIVE

## 2017-10-04 LAB — APTT: aPTT: 30 seconds (ref 24–36)

## 2017-10-05 MED ORDER — TRANEXAMIC ACID 1000 MG/10ML IV SOLN
1000.0000 mg | INTRAVENOUS | Status: AC
  Administered 2017-10-06: 1000 mg via INTRAVENOUS
  Filled 2017-10-05: qty 1100

## 2017-10-05 NOTE — Anesthesia Preprocedure Evaluation (Addendum)
Anesthesia Evaluation  Patient identified by MRN, date of birth, ID band Patient awake    Reviewed: Allergy & Precautions, H&P , NPO status , Patient's Chart, lab work & pertinent test results  Airway Mallampati: II  TM Distance: >3 FB Neck ROM: Full    Dental no notable dental hx.    Pulmonary neg pulmonary ROS, shortness of breath, COPD, former smoker,    Pulmonary exam normal breath sounds clear to auscultation       Cardiovascular hypertension, negative cardio ROS Normal cardiovascular exam Rhythm:Regular Rate:Normal     Neuro/Psych negative neurological ROS  negative psych ROS   GI/Hepatic negative GI ROS, Neg liver ROS,   Endo/Other  negative endocrine ROS  Renal/GU negative Renal ROS     Musculoskeletal  (+) Arthritis , Osteoarthritis,    Abdominal   Peds  Hematology negative hematology ROS (+)   Anesthesia Other Findings   Reproductive/Obstetrics                           Lab Results  Component Value Date   WBC 7.5 10/04/2017   HGB 16.0 10/04/2017   HCT 48.3 10/04/2017   MCV 90.8 10/04/2017   PLT 170 10/04/2017    Anesthesia Physical Anesthesia Plan  ASA: II  Anesthesia Plan: Spinal and Regional   Post-op Pain Management:    Induction:   PONV Risk Score and Plan:   Airway Management Planned: Mask, Natural Airway and Nasal Cannula  Additional Equipment:   Intra-op Plan:   Post-operative Plan:   Informed Consent: I have reviewed the patients History and Physical, chart, labs and discussed the procedure including the risks, benefits and alternatives for the proposed anesthesia with the patient or authorized representative who has indicated his/her understanding and acceptance.     Plan Discussed with: CRNA  Anesthesia Plan Comments:         Anesthesia Quick Evaluation

## 2017-10-06 ENCOUNTER — Encounter (HOSPITAL_COMMUNITY): Payer: Self-pay

## 2017-10-06 ENCOUNTER — Encounter (HOSPITAL_COMMUNITY)
Admission: RE | Disposition: A | Discharge status: Skilled Nursing Facility | Payer: Self-pay | Source: Ambulatory Visit | Attending: Specialist

## 2017-10-06 ENCOUNTER — Inpatient Hospital Stay (HOSPITAL_COMMUNITY): Payer: PPO

## 2017-10-06 ENCOUNTER — Inpatient Hospital Stay (HOSPITAL_COMMUNITY): Payer: PPO | Admitting: Anesthesiology

## 2017-10-06 ENCOUNTER — Inpatient Hospital Stay (HOSPITAL_COMMUNITY)
Admission: RE | Admit: 2017-10-06 | Discharge: 2017-10-08 | DRG: 470 | Disposition: A | Discharge status: Skilled Nursing Facility | Payer: PPO | Source: Ambulatory Visit | Attending: Specialist | Admitting: Specialist

## 2017-10-06 ENCOUNTER — Other Ambulatory Visit: Payer: Self-pay

## 2017-10-06 DIAGNOSIS — J449 Chronic obstructive pulmonary disease, unspecified: Secondary | ICD-10-CM | POA: Diagnosis present

## 2017-10-06 DIAGNOSIS — Z6824 Body mass index (BMI) 24.0-24.9, adult: Secondary | ICD-10-CM | POA: Diagnosis not present

## 2017-10-06 DIAGNOSIS — M1712 Unilateral primary osteoarthritis, left knee: Secondary | ICD-10-CM | POA: Diagnosis present

## 2017-10-06 DIAGNOSIS — E669 Obesity, unspecified: Secondary | ICD-10-CM | POA: Diagnosis present

## 2017-10-06 DIAGNOSIS — I10 Essential (primary) hypertension: Secondary | ICD-10-CM | POA: Diagnosis present

## 2017-10-06 DIAGNOSIS — Z888 Allergy status to other drugs, medicaments and biological substances status: Secondary | ICD-10-CM | POA: Diagnosis not present

## 2017-10-06 DIAGNOSIS — E785 Hyperlipidemia, unspecified: Secondary | ICD-10-CM | POA: Diagnosis present

## 2017-10-06 DIAGNOSIS — Z79899 Other long term (current) drug therapy: Secondary | ICD-10-CM

## 2017-10-06 DIAGNOSIS — Z96659 Presence of unspecified artificial knee joint: Secondary | ICD-10-CM

## 2017-10-06 DIAGNOSIS — Z880 Allergy status to penicillin: Secondary | ICD-10-CM

## 2017-10-06 DIAGNOSIS — Z87891 Personal history of nicotine dependence: Secondary | ICD-10-CM

## 2017-10-06 HISTORY — PX: TOTAL KNEE ARTHROPLASTY: SHX125

## 2017-10-06 SURGERY — ARTHROPLASTY, KNEE, TOTAL
Anesthesia: Regional | Site: Knee | Laterality: Left

## 2017-10-06 MED ORDER — PROPOFOL 500 MG/50ML IV EMUL
INTRAVENOUS | Status: DC | PRN
  Administered 2017-10-06: 20 mg via INTRAVENOUS
  Administered 2017-10-06: 30 mg via INTRAVENOUS

## 2017-10-06 MED ORDER — PROPOFOL 10 MG/ML IV BOLUS
INTRAVENOUS | Status: AC
  Filled 2017-10-06: qty 20

## 2017-10-06 MED ORDER — AMLODIPINE BESYLATE 5 MG PO TABS
5.0000 mg | ORAL_TABLET | Freq: Every day | ORAL | Status: DC
  Administered 2017-10-07: 5 mg via ORAL
  Filled 2017-10-06 (×2): qty 1

## 2017-10-06 MED ORDER — PROMETHAZINE HCL 25 MG/ML IJ SOLN: 6.2500 mg | INTRAMUSCULAR | Status: DC | PRN

## 2017-10-06 MED ORDER — TAMSULOSIN HCL 0.4 MG PO CAPS
0.4000 mg | ORAL_CAPSULE | Freq: Every day | ORAL | Status: DC
  Administered 2017-10-07 – 2017-10-08 (×2): 0.4 mg via ORAL
  Filled 2017-10-06 (×2): qty 1

## 2017-10-06 MED ORDER — PANTOPRAZOLE SODIUM 40 MG PO TBEC
40.0000 mg | DELAYED_RELEASE_TABLET | Freq: Every day | ORAL | Status: DC
Start: 2017-10-07 — End: 2017-10-08
  Administered 2017-10-07 – 2017-10-08 (×2): 40 mg via ORAL
  Filled 2017-10-06 (×2): qty 1

## 2017-10-06 MED ORDER — OXYCODONE HCL 5 MG PO TABS
5.0000 mg | ORAL_TABLET | ORAL | Status: DC | PRN
  Administered 2017-10-07: 5 mg via ORAL
  Administered 2017-10-07 (×2): 10 mg via ORAL
  Administered 2017-10-07: 5 mg via ORAL
  Administered 2017-10-08 (×2): 10 mg via ORAL
  Filled 2017-10-06: qty 2
  Filled 2017-10-06 (×2): qty 1
  Filled 2017-10-06 (×3): qty 2
  Filled 2017-10-06: qty 1

## 2017-10-06 MED ORDER — LACTATED RINGERS IV SOLN
INTRAVENOUS | Status: DC
  Administered 2017-10-06 (×2): 1000 mL via INTRAVENOUS

## 2017-10-06 MED ORDER — ONDANSETRON HCL 4 MG/2ML IJ SOLN
INTRAMUSCULAR | Status: DC | PRN
  Administered 2017-10-06: 4 mg via INTRAVENOUS

## 2017-10-06 MED ORDER — BUPIVACAINE HCL (PF) 0.25 % IJ SOLN
INTRAMUSCULAR | Status: AC
  Filled 2017-10-06: qty 60

## 2017-10-06 MED ORDER — BUPIVACAINE IN DEXTROSE 0.75-8.25 % IT SOLN
INTRATHECAL | Status: DC | PRN
  Administered 2017-10-06: 2 mL via INTRATHECAL

## 2017-10-06 MED ORDER — FENTANYL CITRATE (PF) 100 MCG/2ML IJ SOLN
INTRAMUSCULAR | Status: AC
  Filled 2017-10-06: qty 2

## 2017-10-06 MED ORDER — STERILE WATER FOR IRRIGATION IR SOLN
Status: DC | PRN
  Administered 2017-10-06: 2000 mL

## 2017-10-06 MED ORDER — KCL IN DEXTROSE-NACL 20-5-0.45 MEQ/L-%-% IV SOLN
INTRAVENOUS | Status: AC
  Administered 2017-10-06: 14:00:00 via INTRAVENOUS
  Filled 2017-10-06 (×2): qty 1000

## 2017-10-06 MED ORDER — MENTHOL 3 MG MT LOZG
1.0000 | LOZENGE | OROMUCOSAL | Status: DC | PRN
  Filled 2017-10-06: qty 9

## 2017-10-06 MED ORDER — SODIUM CHLORIDE 0.9 % IV SOLN
INTRAVENOUS | Status: DC | PRN
  Administered 2017-10-06: 500 mL

## 2017-10-06 MED ORDER — PHENOL 1.4 % MT LIQD: 1.0000 | OROMUCOSAL | Status: DC | PRN

## 2017-10-06 MED ORDER — ONDANSETRON HCL 4 MG/2ML IJ SOLN: 4.0000 mg | Freq: Four times a day (QID) | INTRAMUSCULAR | Status: DC | PRN

## 2017-10-06 MED ORDER — METHOCARBAMOL 1000 MG/10ML IJ SOLN
500.0000 mg | Freq: Four times a day (QID) | INTRAVENOUS | Status: DC | PRN
  Administered 2017-10-06: 500 mg via INTRAVENOUS
  Filled 2017-10-06: qty 550

## 2017-10-06 MED ORDER — HYDROCHLOROTHIAZIDE 12.5 MG PO CAPS: 12.5000 mg | ORAL_CAPSULE | Freq: Every day | ORAL | Status: DC

## 2017-10-06 MED ORDER — ACETAMINOPHEN 325 MG PO TABS
325.0000 mg | ORAL_TABLET | Freq: Four times a day (QID) | ORAL | Status: DC | PRN
  Administered 2017-10-07: 650 mg via ORAL
  Filled 2017-10-06: qty 2

## 2017-10-06 MED ORDER — BISACODYL 5 MG PO TBEC: 5.0000 mg | DELAYED_RELEASE_TABLET | Freq: Every day | ORAL | Status: DC | PRN

## 2017-10-06 MED ORDER — IBUPROFEN 400 MG PO TABS
800.0000 mg | ORAL_TABLET | Freq: Three times a day (TID) | ORAL | Status: DC | PRN
  Administered 2017-10-06 – 2017-10-07 (×2): 800 mg via ORAL
  Filled 2017-10-06 (×3): qty 2

## 2017-10-06 MED ORDER — CEFAZOLIN SODIUM-DEXTROSE 2-4 GM/100ML-% IV SOLN
2.0000 g | INTRAVENOUS | Status: AC
  Administered 2017-10-06: 2 g via INTRAVENOUS

## 2017-10-06 MED ORDER — METHOCARBAMOL 500 MG PO TABS
500.0000 mg | ORAL_TABLET | Freq: Four times a day (QID) | ORAL | Status: DC | PRN
  Administered 2017-10-06 – 2017-10-07 (×3): 500 mg via ORAL
  Filled 2017-10-06 (×3): qty 1

## 2017-10-06 MED ORDER — IBUPROFEN 400 MG PO TABS: 800.0000 mg | ORAL_TABLET | Freq: Three times a day (TID) | ORAL | Status: DC | PRN

## 2017-10-06 MED ORDER — PROPOFOL 500 MG/50ML IV EMUL
INTRAVENOUS | Status: DC | PRN
  Administered 2017-10-06: 75 ug/kg/min via INTRAVENOUS

## 2017-10-06 MED ORDER — DUTASTERIDE 0.5 MG PO CAPS
0.5000 mg | ORAL_CAPSULE | Freq: Every day | ORAL | Status: DC
  Administered 2017-10-07 – 2017-10-08 (×2): 0.5 mg via ORAL
  Filled 2017-10-06 (×2): qty 1

## 2017-10-06 MED ORDER — METOCLOPRAMIDE HCL 5 MG/ML IJ SOLN: 5.0000 mg | Freq: Three times a day (TID) | INTRAMUSCULAR | Status: DC | PRN

## 2017-10-06 MED ORDER — ACETAMINOPHEN 500 MG PO TABS
1000.0000 mg | ORAL_TABLET | Freq: Four times a day (QID) | ORAL | Status: AC
  Administered 2017-10-06 (×2): 1000 mg via ORAL
  Filled 2017-10-06 (×2): qty 2

## 2017-10-06 MED ORDER — ALBUTEROL SULFATE (2.5 MG/3ML) 0.083% IN NEBU: 2.5000 mg | INHALATION_SOLUTION | Freq: Four times a day (QID) | RESPIRATORY_TRACT | Status: DC | PRN

## 2017-10-06 MED ORDER — PHENYLEPHRINE HCL 10 MG/ML IJ SOLN
INTRAMUSCULAR | Status: AC
  Filled 2017-10-06: qty 1

## 2017-10-06 MED ORDER — MAGNESIUM OXIDE 400 (241.3 MG) MG PO TABS
400.0000 mg | ORAL_TABLET | Freq: Every day | ORAL | Status: DC
  Administered 2017-10-07 – 2017-10-08 (×2): 400 mg via ORAL
  Filled 2017-10-06 (×2): qty 1

## 2017-10-06 MED ORDER — TIOTROPIUM BROMIDE MONOHYDRATE 18 MCG IN CAPS
1.0000 | ORAL_CAPSULE | Freq: Every day | RESPIRATORY_TRACT | Status: DC
  Administered 2017-10-07 – 2017-10-08 (×2): 18 ug via RESPIRATORY_TRACT
  Filled 2017-10-06: qty 5

## 2017-10-06 MED ORDER — MAGNESIUM CITRATE PO SOLN: 1.0000 | Freq: Once | ORAL | Status: DC | PRN

## 2017-10-06 MED ORDER — DUTASTERIDE-TAMSULOSIN HCL 0.5-0.4 MG PO CAPS: 1.0000 | ORAL_CAPSULE | Freq: Every day | ORAL | Status: DC

## 2017-10-06 MED ORDER — CEFAZOLIN SODIUM-DEXTROSE 2-4 GM/100ML-% IV SOLN
2.0000 g | Freq: Four times a day (QID) | INTRAVENOUS | Status: AC
  Administered 2017-10-06 (×2): 2 g via INTRAVENOUS
  Filled 2017-10-06 (×2): qty 100

## 2017-10-06 MED ORDER — FENTANYL CITRATE (PF) 100 MCG/2ML IJ SOLN
50.0000 ug | INTRAMUSCULAR | Status: DC
  Administered 2017-10-06: 50 ug via INTRAVENOUS

## 2017-10-06 MED ORDER — ACETAMINOPHEN 10 MG/ML IV SOLN: 1000.0000 mg | Freq: Once | INTRAVENOUS | Status: DC | PRN

## 2017-10-06 MED ORDER — ASPIRIN 81 MG PO CHEW
81.0000 mg | CHEWABLE_TABLET | Freq: Two times a day (BID) | ORAL | Status: DC
  Administered 2017-10-07 – 2017-10-08 (×3): 81 mg via ORAL
  Filled 2017-10-06 (×3): qty 1

## 2017-10-06 MED ORDER — DIPHENHYDRAMINE HCL 12.5 MG/5ML PO ELIX: 12.5000 mg | ORAL_SOLUTION | ORAL | Status: DC | PRN

## 2017-10-06 MED ORDER — ALUM & MAG HYDROXIDE-SIMETH 200-200-20 MG/5ML PO SUSP: 30.0000 mL | ORAL | Status: DC | PRN

## 2017-10-06 MED ORDER — MEPERIDINE HCL 50 MG/ML IJ SOLN: 6.2500 mg | INTRAMUSCULAR | Status: DC | PRN

## 2017-10-06 MED ORDER — HYDROMORPHONE HCL 1 MG/ML IJ SOLN
INTRAMUSCULAR | Status: AC
  Filled 2017-10-06: qty 1

## 2017-10-06 MED ORDER — RISAQUAD PO CAPS
1.0000 | ORAL_CAPSULE | Freq: Every day | ORAL | Status: DC
  Administered 2017-10-07 – 2017-10-08 (×2): 1 via ORAL
  Filled 2017-10-06 (×2): qty 1

## 2017-10-06 MED ORDER — MIDAZOLAM HCL 2 MG/2ML IJ SOLN
INTRAMUSCULAR | Status: AC
  Filled 2017-10-06: qty 2

## 2017-10-06 MED ORDER — ONDANSETRON HCL 4 MG PO TABS
4.0000 mg | ORAL_TABLET | Freq: Four times a day (QID) | ORAL | Status: DC | PRN
  Administered 2017-10-07: 4 mg via ORAL
  Filled 2017-10-06: qty 1

## 2017-10-06 MED ORDER — MIDAZOLAM HCL 2 MG/2ML IJ SOLN: 1.0000 mg | INTRAMUSCULAR | Status: DC

## 2017-10-06 MED ORDER — POLYETHYLENE GLYCOL 3350 17 G PO PACK: 17.0000 g | PACK | Freq: Every day | ORAL | Status: DC | PRN

## 2017-10-06 MED ORDER — PROPOFOL 10 MG/ML IV BOLUS
INTRAVENOUS | Status: AC
  Filled 2017-10-06: qty 40

## 2017-10-06 MED ORDER — PHENYLEPHRINE HCL 10 MG/ML IJ SOLN
INTRAVENOUS | Status: DC | PRN
  Administered 2017-10-06: 25 ug/min via INTRAVENOUS

## 2017-10-06 MED ORDER — SODIUM CHLORIDE 0.9 % IR SOLN
Status: DC | PRN
  Administered 2017-10-06: 2000 mL

## 2017-10-06 MED ORDER — DOCUSATE SODIUM 100 MG PO CAPS
100.0000 mg | ORAL_CAPSULE | Freq: Two times a day (BID) | ORAL | Status: DC
  Administered 2017-10-07 – 2017-10-08 (×3): 100 mg via ORAL
  Filled 2017-10-06 (×4): qty 1

## 2017-10-06 MED ORDER — SODIUM CHLORIDE 0.9 % IV SOLN
INTRAVENOUS | Status: AC
  Filled 2017-10-06: qty 500000

## 2017-10-06 MED ORDER — OXYCODONE HCL 5 MG PO TABS: 10.0000 mg | ORAL_TABLET | ORAL | Status: DC | PRN

## 2017-10-06 MED ORDER — ALBUTEROL SULFATE HFA 108 (90 BASE) MCG/ACT IN AERS: 1.0000 | INHALATION_SPRAY | Freq: Four times a day (QID) | RESPIRATORY_TRACT | Status: DC | PRN

## 2017-10-06 MED ORDER — FAMOTIDINE 20 MG PO TABS: 20.0000 mg | ORAL_TABLET | Freq: Two times a day (BID) | ORAL | Status: DC

## 2017-10-06 MED ORDER — HYDROMORPHONE HCL 1 MG/ML IJ SOLN
0.5000 mg | INTRAMUSCULAR | Status: DC | PRN
  Administered 2017-10-07: 12:00:00 0.5 mg via INTRAVENOUS
  Filled 2017-10-06: qty 1

## 2017-10-06 MED ORDER — ACETAMINOPHEN 10 MG/ML IV SOLN
INTRAVENOUS | Status: AC
  Filled 2017-10-06: qty 100

## 2017-10-06 MED ORDER — CEFAZOLIN SODIUM-DEXTROSE 2-4 GM/100ML-% IV SOLN
INTRAVENOUS | Status: AC
  Filled 2017-10-06: qty 100

## 2017-10-06 MED ORDER — ACETAMINOPHEN 10 MG/ML IV SOLN
1000.0000 mg | INTRAVENOUS | Status: AC
  Administered 2017-10-06: 1000 mg via INTRAVENOUS

## 2017-10-06 MED ORDER — BUPIVACAINE HCL (PF) 0.25 % IJ SOLN
INTRAMUSCULAR | Status: DC | PRN
  Administered 2017-10-06: 30 mL

## 2017-10-06 MED ORDER — METOCLOPRAMIDE HCL 5 MG PO TABS: 5.0000 mg | ORAL_TABLET | Freq: Three times a day (TID) | ORAL | Status: DC | PRN

## 2017-10-06 MED ORDER — HYDROMORPHONE HCL 1 MG/ML IJ SOLN
0.2500 mg | INTRAMUSCULAR | Status: DC | PRN
  Administered 2017-10-06 (×2): 0.5 mg via INTRAVENOUS

## 2017-10-06 MED ORDER — LEVOTHYROXINE SODIUM 125 MCG PO TABS
125.0000 ug | ORAL_TABLET | Freq: Every day | ORAL | Status: DC
  Administered 2017-10-07 – 2017-10-08 (×2): 125 ug via ORAL
  Filled 2017-10-06 (×2): qty 1

## 2017-10-06 MED ORDER — ROPIVACAINE HCL 7.5 MG/ML IJ SOLN
INTRAMUSCULAR | Status: DC | PRN
  Administered 2017-10-06: 20 mL via PERINEURAL

## 2017-10-06 MED ORDER — HYDROCODONE-ACETAMINOPHEN 7.5-325 MG PO TABS: 1.0000 | ORAL_TABLET | Freq: Once | ORAL | Status: DC | PRN

## 2017-10-06 MED ORDER — ONDANSETRON HCL 4 MG/2ML IJ SOLN
INTRAMUSCULAR | Status: AC
  Filled 2017-10-06: qty 2

## 2017-10-06 SURGICAL SUPPLY — 60 items
AGENT HMST SPONGE THK3/8 (HEMOSTASIS)
BAG SPEC THK2 15X12 ZIP CLS (MISCELLANEOUS) ×1
BAG ZIPLOCK 12X15 (MISCELLANEOUS) ×2 IMPLANT
BANDAGE ACE 4X5 VEL STRL LF (GAUZE/BANDAGES/DRESSINGS) ×3 IMPLANT
BANDAGE ACE 6X5 VEL STRL LF (GAUZE/BANDAGES/DRESSINGS) ×3 IMPLANT
BLADE SAG 18X100X1.27 (BLADE) ×3 IMPLANT
BLADE SAW SGTL 11.0X1.19X90.0M (BLADE) ×3 IMPLANT
BLADE SAW SGTL 13.0X1.19X90.0M (BLADE) ×3 IMPLANT
CAPT KNEE TOTAL 3 ATTUNE ×2 IMPLANT
CEMENT HV SMART SET (Cement) ×6 IMPLANT
CLOTH 2% CHLOROHEXIDINE 3PK (PERSONAL CARE ITEMS) ×3 IMPLANT
COVER SURGICAL LIGHT HANDLE (MISCELLANEOUS) ×3 IMPLANT
CUFF TOURN SGL QUICK 34 (TOURNIQUET CUFF) ×3
CUFF TRNQT CYL 34X4X40X1 (TOURNIQUET CUFF) ×1 IMPLANT
DECANTER SPIKE VIAL GLASS SM (MISCELLANEOUS) ×3 IMPLANT
DRAPE LG THREE QUARTER DISP (DRAPES) ×3 IMPLANT
DRAPE ORTHO SPLIT 77X108 STRL (DRAPES) ×6
DRAPE SHEET LG 3/4 BI-LAMINATE (DRAPES) ×2 IMPLANT
DRAPE SURG ORHT 6 SPLT 77X108 (DRAPES) ×2 IMPLANT
DRAPE U-SHAPE 47X51 STRL (DRAPES) ×3 IMPLANT
DRESSING AQUACEL AG SP 3.5X10 (GAUZE/BANDAGES/DRESSINGS) IMPLANT
DRSG AQUACEL AG SP 3.5X10 (GAUZE/BANDAGES/DRESSINGS) ×3
DURAPREP 26ML APPLICATOR (WOUND CARE) ×3 IMPLANT
ELECT BLADE TIP CTD 4 INCH (ELECTRODE) ×3 IMPLANT
ELECT REM PT RETURN 15FT ADLT (MISCELLANEOUS) ×3 IMPLANT
EVACUATOR 1/8 PVC DRAIN (DRAIN) IMPLANT
GLOVE BIOGEL PI IND STRL 7.0 (GLOVE) ×1 IMPLANT
GLOVE BIOGEL PI IND STRL 7.5 (GLOVE) IMPLANT
GLOVE BIOGEL PI IND STRL 8 (GLOVE) ×1 IMPLANT
GLOVE BIOGEL PI INDICATOR 7.0 (GLOVE) ×6
GLOVE BIOGEL PI INDICATOR 7.5 (GLOVE) ×4
GLOVE BIOGEL PI INDICATOR 8 (GLOVE) ×4
GLOVE SURG SS PI 7.0 STRL IVOR (GLOVE) ×3 IMPLANT
GLOVE SURG SS PI 7.5 STRL IVOR (GLOVE) ×3 IMPLANT
GLOVE SURG SS PI 8.0 STRL IVOR (GLOVE) ×8 IMPLANT
GOWN STRL REUS W/TWL 2XL LVL3 (GOWN DISPOSABLE) ×2 IMPLANT
GOWN STRL REUS W/TWL XL LVL3 (GOWN DISPOSABLE) ×8 IMPLANT
HANDPIECE INTERPULSE COAX TIP (DISPOSABLE) ×3
HEMOSTAT SPONGE AVITENE ULTRA (HEMOSTASIS) ×1 IMPLANT
IMMOBILIZER KNEE 20 (SOFTGOODS) ×3
IMMOBILIZER KNEE 20 THIGH 36 (SOFTGOODS) ×1 IMPLANT
MANIFOLD NEPTUNE II (INSTRUMENTS) ×3 IMPLANT
PACK TOTAL KNEE CUSTOM (KITS) ×3 IMPLANT
POSITIONER SURGICAL ARM (MISCELLANEOUS) ×3 IMPLANT
SEALER BIPOLAR AQUA 6.0 (INSTRUMENTS) IMPLANT
SET HNDPC FAN SPRY TIP SCT (DISPOSABLE) ×1 IMPLANT
SPONGE SURGIFOAM ABS GEL 100 (HEMOSTASIS) ×2 IMPLANT
STAPLER VISISTAT (STAPLE) ×2 IMPLANT
SUT BONE WAX W31G (SUTURE) ×2 IMPLANT
SUT STRATAFIX 0 PDS 27 VIOLET (SUTURE) ×3
SUT VIC AB 1 CT1 27 (SUTURE) ×9
SUT VIC AB 1 CT1 27XBRD ANTBC (SUTURE) ×2 IMPLANT
SUT VIC AB 2-0 CT1 27 (SUTURE) ×6
SUT VIC AB 2-0 CT1 TAPERPNT 27 (SUTURE) ×3 IMPLANT
SUTURE STRATFX 0 PDS 27 VIOLET (SUTURE) ×1 IMPLANT
SYR 50ML LL SCALE MARK (SYRINGE) IMPLANT
TOWER CARTRIDGE SMART MIX (DISPOSABLE) ×3 IMPLANT
TRAY FOLEY W/METER SILVER 16FR (SET/KITS/TRAYS/PACK) ×3 IMPLANT
WRAP KNEE MAXI GEL POST OP (GAUZE/BANDAGES/DRESSINGS) ×3 IMPLANT
YANKAUER SUCT BULB TIP 10FT TU (MISCELLANEOUS) ×3 IMPLANT

## 2017-10-06 NOTE — Brief Op Note (Signed)
10/06/2017  10:37 AM  PATIENT:  Joseph [name]  83 y.o. male  PRE-OPERATIVE DIAGNOSIS:  Left knee degenerative joint disease  POST-OPERATIVE DIAGNOSIS:  Left knee degenerative joint disease  PROCEDURE:  Procedure(s) with comments: LEFT TOTAL KNEE ARTHROPLASTY (Left) - Adductor Block  SURGEON:  Surgeon(s) and Role:    Susa Day, MD - Primary  PHYSICIAN ASSISTANT:   ASSISTANTS: Bissell   ANESTHESIA:   general  EBL:  100 mL   BLOOD ADMINISTERED:none  DRAINS: none   LOCAL MEDICATIONS USED:  MARCAINE     SPECIMEN:  No Specimen  DISPOSITION OF SPECIMEN:  N/A  COUNTS:  YES  TOURNIQUET:   Total Tourniquet Time Documented: Thigh (Left) - 66 minutes Total: Thigh (Left) - 66 minutes   DICTATION: .Other Dictation: Dictation Number [id number]  PLAN OF CARE: Admit to inpatient   PATIENT DISPOSITION:  PACU - hemodynamically stable.   Delay start of Pharmacological VTE agent (>24hrs) due to surgical blood loss or risk of bleeding: no

## 2017-10-06 NOTE — Transfer of Care (Signed)
Immediate Anesthesia Transfer of Care Note  Patient: Joseph [name]  Procedure(s) Performed: LEFT TOTAL KNEE ARTHROPLASTY (Left Knee)  Patient Location: PACU  Anesthesia Type:MAC and Spinal  Level of Consciousness: awake, alert  and oriented  Airway & Oxygen Therapy: Patient Spontanous Breathing and Patient connected to face mask oxygen  Post-op Assessment: Report given to RN and Post -op Vital signs reviewed and stable  Post vital signs: Reviewed and stable  Last Vitals:  Vitals Value Taken Time  BP    Temp    Pulse 76 10/06/2017 11:01 AM  Resp 12 10/06/2017 11:02 AM  SpO2 91 % 10/06/2017 11:01 AM  Vitals shown include unvalidated device data.  Last Pain:  Vitals:   10/06/17 0811  TempSrc:   PainSc: 0-No pain      Patients Stated Pain Goal: 5 (56/[id number])  Complications: No apparent anesthesia complications

## 2017-10-06 NOTE — Plan of Care (Signed)
  Problem: Activity: Goal: Ability to avoid complications of mobility impairment will improve Outcome: Progressing   Problem: Pain Management: Goal: Pain level will decrease with appropriate interventions 10/06/2017 1548 by Dorene Sorrow, RN Outcome: Progressing 10/06/2017 1312 by Dorene Sorrow, RN Outcome: Progressing   Problem: Nutrition: Goal: Adequate nutrition will be maintained Outcome: Progressing   Problem: Elimination: Goal: Will not experience complications related to bowel motility Outcome: Progressing

## 2017-10-06 NOTE — Plan of Care (Signed)
  Problem: Pain Management: Goal: Pain level will decrease with appropriate interventions Outcome: Progressing   Problem: Skin Integrity: Goal: Signs of wound healing will improve Outcome: Progressing

## 2017-10-06 NOTE — Progress Notes (Signed)
AssistedDr. Houser with left, ultrasound guided, adductor canal block. Side rails up, monitors on throughout procedure. See vital signs in flow sheet. Tolerated Procedure well.  

## 2017-10-06 NOTE — Op Note (Signed)
NAMEKAVONTE, BEARSE NO.:  [id number]  MEDICAL RECORD NO.:  [id number]  LOCATION:                                 FACILITY:  PHYSICIAN:  Susa Day, M.D.         DATE OF BIRTH:  DATE OF PROCEDURE:  10/06/2017 DATE OF DISCHARGE:                              OPERATIVE REPORT   PREOPERATIVE DIAGNOSIS:  End-stage osteoarthrosis of the left knee.  POSTOPERATIVE DIAGNOSIS:  End-stage osteoarthrosis of the left knee.  PROCEDURE PERFORMED:  Left total knee arthroplasty utilizing DePuy rotating platform, 6 femur, 7 tibia, 10 mm insert, 38 patella.  ANESTHESIA:  General.  ASSISTANT:  Lacie Draft, PA.  HISTORY:  This is a pleasant gentleman, end-stage osteoarthrosis of the knee, refractory to conservative treatment with progressive degeneration of the knee itself.  Bone-on-bone arthrosis refractory to rest, activity modification, injections.  Discussed living with the symptoms versus total knee.  He would like to proceed with lateral with a negative effect to his activities of daily living.  Risks and benefits were discussed including bleeding, infection, damage to neurovascular structures, no change in symptoms, worsening symptoms, DVT, PE, anesthetic complications, etc.  TECHNIQUE:  With the patient in supine position after induction of adequate anesthesia, the left lower extremity was prepped and draped and exsanguinated in usual sterile fashion.  Thigh tourniquet inflated to 225 mmHg.  Midline incision was then made over the knee.  Full-thickness flaps developed.  Median parapatellar arthrotomy performed.  Patella everted.  Knee flexed.  Tricompartmental osteoarthrosis was noted particularly medial compartment, bone on bone.  Remnants of medial and lateral menisci were removed.  A large popliteal cyst was drained.  I used a step drill to enter the femoral canal and was irrigated, 5-degree left was selected with 9 off the distal femur.  This was pinned  and we performed a distal femoral cut.  This was then incised off the anterolateral cortex between a 6 and a 7.  We selected a 6.  This was then pinned.  We performed anterior, posterior, and chamfer cuts without notching the femur.  We then subluxed the tibia.  The defect was medially.  This was then measured 2 off the defect, which was medially, which was 10 off the high side.  This was then pinned bisecting tibiotalar joint 30 degree slope.  Parallel to the shaft, this was pinned, we performed a distal femoral cut.  Protecting the soft tissues posteriorly at all times.  I then measured the tibia to a 7, subluxed it, maximally covered it, used a 7, pinned it and then performed a central drill and punch guide.  We then performed our notch cut on the femur after bisecting it with a box cut jig.  Again protecting the soft tissue posteriorly at all times, we then placed a trial femur, tibia drilled our lug holes.  We reduced it with a 7 mm insert.  He had slight hyperextension.  We placed an 8 mm and he had a full extension.  We then flexed the knee and then extended it.  We had good stability, varus and valgus stressing 0-30 degrees.  Everted the patella and measured to a  23, planed to a 15 with the external alignment guide.  We took 7.5 off this.  We then drilled our peg holes medializing those with her, placed her paddle in a trial 38, reduced it and had excellent patellofemoral tracking noted.  We removed all instrumentation, checked posteriorly, cauterized the geniculates.  Popliteus was intact and was the capsule. Pulsatile lavage was used to clean the knee.  The knee was then dried after flexed.  Mixed cement on back table in appropriate fashion, placed it on the prosthetic components, injected into the tibia, digitally is pressurizing it.  We cemented and impacted the tibial tray, cemented and impacted the femur.  We placed a 10 insert, reduced it, held axial load throughout the  curing of the cement.  We had full extension.  We cemented and clamped the patella.  Marcaine 0.25% with epinephrine was infiltrated in the periosteal tissue.  We then copiously irrigated with antibiotic irrigation.  After curing the cement, we had full extension, full flexion, good stability, varus and valgus stressing 0-30 degrees, negative anterior drawer.  Knee was slightly hyperextended with the 8 in there, we tried that as well, 10 was passed.  After flexing the knee, we meticulously removed all redundant cement and then placed our permanent insert and then reduced it.  Tourniquet was deflated.  We cauterized any bleeding that was noted superficially.  There was some continuing deep posteriorly.  After inspection, we felt best to flex the knee back up, removed the insert, and checked posteriorly, no active bleeder, just generalized oozing, had been on aspirin 5 days prior to this. Meticulously re-cauterized all geniculate regions, capsules, bone wax on the cancellous surfaces, and then replaced our insert.  Following this, we felt that was appropriately controlled.  Full extension, full flexion, good stability with varus and valgus stress and negative anterior drawer.  Slight flexion.  Reapproximated patellar arthrotomy with #1 Vicryl interrupted figure-of-eight suture after copious irrigation with antibiotic irrigation and pulsatile lavage.  Oversewn with the Stratafix, subcu with 2-0, skin with staples.  Had flexion to gravity after closure and good stability and excellent patellofemoral tracking.  Next, a sterile dressing applied, placed in immobilizer, extubated without difficulty, and transported to the recovery room in satisfactory condition.  The patient tolerated the procedure well.  No complications.  Assistant, Lacie Draft, [place].  Minimal blood loss.     Susa Day, M.D.   ______________________________ Susa Day, M.D.    Geralynn Rile  D:  10/06/2017  T:   10/06/2017  Job:  [id number]

## 2017-10-06 NOTE — Interval H&P Note (Signed)
History and Physical Interval Note:  10/06/2017 8:28 AM  Joseph [name]  has presented today for surgery, with the diagnosis of Left knee degenerative joint disease  The various methods of treatment have been discussed with the patient and family. After consideration of risks, benefits and other options for treatment, the patient has consented to  Procedure(s) with comments: LEFT TOTAL KNEE ARTHROPLASTY (Left) - 150 mins as a surgical intervention .  The patient's history has been reviewed, patient examined, no change in status, stable for surgery.  I have reviewed the patient's chart and labs.  Questions were answered to the patient's satisfaction.     [name]

## 2017-10-06 NOTE — Anesthesia Procedure Notes (Signed)
Anesthesia Regional Block: Adductor canal block   Pre-Anesthetic Checklist: ,, timeout performed, Correct Patient, Correct Site, Correct Laterality, Correct Procedure, Correct Position, site marked, Risks and benefits discussed,  Surgical consent,  Pre-op evaluation,  At surgeon's request and post-op pain management  Laterality: Left  Prep: chloraprep       Needles:  Injection technique: Single-shot  Needle Type: Echogenic Needle     Needle Length: 9cm  Needle Gauge: 21     Additional Needles:   Procedures:,,,, ultrasound used (permanent image in chart),,,,  Narrative:  Start time: 10/06/2017 8:03 AM End time: 10/06/2017 8:08 AM Injection made incrementally with aspirations every 5 mL.  Performed by: Personally  Anesthesiologist: Barnet Glasgow, MD

## 2017-10-06 NOTE — Discharge Instructions (Signed)

## 2017-10-06 NOTE — Anesthesia Procedure Notes (Signed)
Spinal  Patient location during procedure: OR Start time: 10/06/2017 8:40 AM End time: 10/06/2017 8:45 AM Staffing Anesthesiologist: Barnet Glasgow, MD Performed: anesthesiologist  Preanesthetic Checklist Completed: patient identified, surgical consent, pre-op evaluation, timeout performed, IV checked, risks and benefits discussed and monitors and equipment checked Spinal Block Patient position: sitting Prep: site prepped and draped and DuraPrep Patient monitoring: heart rate, cardiac monitor, continuous pulse ox and blood pressure Approach: midline Location: L3-4 Injection technique: single-shot Needle Needle type: Pencan  Needle gauge: 24 G Needle length: 10 cm Assessment Sensory level: T4

## 2017-10-06 NOTE — Evaluation (Signed)
Physical Therapy Evaluation Patient Details Name: Joseph [name] MRN: [id number] DOB: 1935/01/10 Today's Date: 10/06/2017   History of Present Illness  L TKA  Clinical Impression  Pt is s/p TKA resulting in the deficits listed below (see PT Problem List). Pt ambulated 15' with RW with frequent verbal and manual cues for safety due to pt's impulsivity/decreased safety awareness. Instructed pt in TKA HEP.  Pt will benefit from skilled PT to increase their independence and safety with mobility to allow discharge to the venue listed below.      Follow Up Recommendations Follow surgeon's recommendation for DC plan and follow-up therapies(pt lives alone, has no assist available at home, ? ST-SNF?)    Equipment Recommendations  Rolling walker with 5" wheels;3in1 (PT)    Recommendations for Other Services       Precautions / Restrictions Precautions Precautions: Knee Precaution Comments: reviewed no pillow under knee Restrictions Weight Bearing Restrictions: No Other Position/Activity Restrictions: WBAT      Mobility  Bed Mobility Overal bed mobility: Modified Independent             General bed mobility comments: HOB up  Transfers Overall transfer level: Needs assistance Equipment used: Rolling walker (2 wheeled) Transfers: Sit to/from Stand Sit to Stand: Min assist         General transfer comment: VCs hand placement, min A to steady and rise  Ambulation/Gait Ambulation/Gait assistance: Min guard;Min assist Ambulation Distance (Feet): 60 Feet Assistive device: Rolling walker (2 wheeled) Gait Pattern/deviations: Step-to pattern   Gait velocity interpretation: at or above normal speed for age/gender General Gait Details: VCs to slow down, to step into frame of RW and for sequencing  Stairs            Wheelchair Mobility    Modified Rankin (Stroke Patients Only)       Balance Overall balance assessment: Modified Independent                                            Pertinent Vitals/Pain Pain Assessment: 0-10 Pain Score: 7  Pain Location: L knee with flexion Pain Descriptors / Indicators: Sore Pain Intervention(s): Limited activity within patient's tolerance;Monitored during session;Premedicated before session;Ice applied    Home Living Family/patient expects to be discharged to:: Private residence Living Arrangements: Alone Available Help at Discharge: (pt reports he has no help available at home)           Home Equipment: None      Prior Function Level of Independence: Independent         Comments: drives, independent ADLs     Hand Dominance   Dominant Hand: Right    Extremity/Trunk Assessment   Upper Extremity Assessment Upper Extremity Assessment: Overall WFL for tasks assessed    Lower Extremity Assessment Lower Extremity Assessment: LLE deficits/detail LLE Deficits / Details: 0-35* AAROM L knee    Cervical / Trunk Assessment Cervical / Trunk Assessment: Normal  Communication   Communication: HOH  Cognition Arousal/Alertness: Awake/alert Behavior During Therapy: Impulsive Overall Cognitive Status: Within Functional Limits for tasks assessed                                 General Comments: required verbal cues for safety 2* impulsivity      General Comments      Exercises  Total Joint Exercises Ankle Circles/Pumps: AROM;Both;10 reps;Supine Quad Sets: AROM;Left;Supine Heel Slides: AAROM;Left;10 reps;Supine Straight Leg Raises: AROM;5 reps;Left;Supine Goniometric ROM: 5-35* AAROM L knee   Assessment/Plan    PT Assessment Patient needs continued PT services  PT Problem List Decreased strength;Decreased range of motion;Decreased activity tolerance;Decreased mobility;Pain;Decreased knowledge of use of DME       PT Treatment Interventions DME instruction;Gait training;Stair training;Therapeutic exercise;Therapeutic activities;Functional mobility  training;Patient/family education    PT Goals (Current goals can be found in the Care Plan section)  Acute Rehab PT Goals Patient Stated Goal: to drive PT Goal Formulation: With patient Time For Goal Achievement: 10/13/17 Potential to Achieve Goals: Good    Frequency 7X/week   Barriers to discharge        Co-evaluation               AM-PAC PT "6 Clicks" Daily Activity  Outcome Measure Difficulty turning over in bed (including adjusting bedclothes, sheets and blankets)?: None Difficulty moving from lying on back to sitting on the side of the bed? : A Little Difficulty sitting down on and standing up from a chair with arms (e.g., wheelchair, bedside commode, etc,.)?: Unable Help needed moving to and from a bed to chair (including a wheelchair)?: A Little Help needed walking in hospital room?: A Little Help needed climbing 3-5 steps with a railing? : A Lot 6 Click Score: 16    End of Session Equipment Utilized During Treatment: Gait belt;Left knee immobilizer Activity Tolerance: Patient tolerated treatment well Patient left: in chair;with call bell/phone within reach;with chair alarm set Nurse Communication: Mobility status PT Visit Diagnosis: Muscle weakness (generalized) (M62.81);Difficulty in walking, not elsewhere classified (R26.2);Pain Pain - Right/Left: Left Pain - part of body: Knee    Time: 7169-6789 PT Time Calculation (min) (ACUTE ONLY): 25 min   Charges:   PT Evaluation $PT Eval Low Complexity: 1 Low PT Treatments $Gait Training: 8-22 mins   PT G Codes:          Philomena Doheny 10/06/2017, 3:57 PM [phone]

## 2017-10-06 NOTE — Anesthesia Procedure Notes (Signed)
Date/Time: 10/06/2017 8:31 AM Performed by: Glory Buff, CRNA Oxygen Delivery Method: Simple face mask

## 2017-10-07 LAB — BASIC METABOLIC PANEL
ANION GAP: 5 (ref 5–15)
BUN: 10 mg/dL (ref 6–20)
CHLORIDE: 107 mmol/L (ref 101–111)
CO2: 29 mmol/L (ref 22–32)
CREATININE: 0.82 mg/dL (ref 0.61–1.24)
Calcium: 8.4 mg/dL — ABNORMAL LOW (ref 8.9–10.3)
GFR calc non Af Amer: >60 mL/min (ref >60)
Glucose, Bld: 123 mg/dL — ABNORMAL HIGH (ref 65–99)
POTASSIUM: 4.3 mmol/L (ref 3.5–5.1)
Sodium: 141 mmol/L (ref 135–145)

## 2017-10-07 LAB — CBC
HEMATOCRIT: 38.2 % — AB (ref 39.0–52.0)
Hemoglobin: 12.4 g/dL — ABNORMAL LOW (ref 13.0–17.0)
MCH: 29.6 pg (ref 26.0–34.0)
MCHC: 32.5 g/dL (ref 30.0–36.0)
MCV: 91.2 fL (ref 78.0–100.0)
Platelets: 145 10*3/uL — ABNORMAL LOW (ref 150–400)
RBC: 4.19 MIL/uL — AB (ref 4.22–5.81)
RDW: 14.2 % (ref 11.5–15.5)
WBC: 9.8 10*3/uL (ref 4.0–10.5)

## 2017-10-07 NOTE — Progress Notes (Addendum)
Patient ID: Joseph [name], male   DOB: 1934-09-02, 82 y.o.   MRN: [id number] Subjective: 1 Day Post-Op Procedure(s) (LRB): LEFT TOTAL KNEE ARTHROPLASTY (Left) Patient reports pain as mild.   Seen by Dr Tonita Cong this AM. Did well overnight. Patient has complaints of L knee pain  We will start therapy today. Pt would like to go home with HHPT after hospital stay but we have discussed possible need for SNF as he lives alone and has no help. Depends upon PT progress.  Objective: Vital signs in last 24 hours: Temp:  [97 F (36.1 C)-99.3 F (37.4 C)] 99.1 F (37.3 C) (04/04 0933) Pulse Rate:  [60-86] 86 (04/04 0933) Resp:  [14-18] 18 (04/04 0933) BP: (98-135)/(52-80) 135/60 (04/04 0933) SpO2:  [95 %-100 %] 97 % (04/04 0933)  Intake/Output from previous day:  Intake/Output Summary (Last 24 hours) at 10/07/2017 1050 Last data filed at 10/07/2017 0939 Gross per 24 hour  Intake 3872.33 ml  Output 2950 ml  Net 922.33 ml    Intake/Output this shift: Total I/O In: -  Out: 250 [Urine:250]  Labs: Results for orders placed or performed during the hospital encounter of 10/06/17  CBC  Result Value Ref Range   WBC 9.8 4.0 - 10.5 K/uL   RBC 4.19 (L) 4.22 - 5.81 MIL/uL   Hemoglobin 12.4 (L) 13.0 - 17.0 g/dL   HCT 38.2 (L) 39.0 - 52.0 %   MCV 91.2 78.0 - 100.0 fL   MCH 29.6 26.0 - 34.0 pg   MCHC 32.5 30.0 - 36.0 g/dL   RDW 14.2 11.5 - 15.5 %   Platelets 145 (L) 150 - 400 K/uL  Basic metabolic panel  Result Value Ref Range   Sodium 141 135 - 145 mmol/L   Potassium 4.3 3.5 - 5.1 mmol/L   Chloride 107 101 - 111 mmol/L   CO2 29 22 - 32 mmol/L   Glucose, Bld 123 (H) 65 - 99 mg/dL   BUN 10 6 - 20 mg/dL   Creatinine, Ser 0.82 0.61 - 1.24 mg/dL   Calcium 8.4 (L) 8.9 - 10.3 mg/dL   GFR calc non Af Amer >60 >60 mL/min   GFR calc Af Amer >60 >60 mL/min   Anion gap 5 5 - 15    Exam - Neurologically intact ABD soft Neurovascular intact Sensation intact distally Intact pulses  distally Dorsiflexion/Plantar flexion intact Incision: dressing C/D/I and no drainage No cellulitis present Compartment soft no sign of DVT Dressing - clean, dry, no drainage Motor function intact - moving foot and toes well on exam.   Assessment/Plan: 1 Day Post-Op Procedure(s) (LRB): LEFT TOTAL KNEE ARTHROPLASTY (Left)  Advance diet Up with therapy D/C IV fluids Past Medical History:  Diagnosis Date  . Arthritis   . COPD (chronic obstructive pulmonary disease) ([place])   . Dyspnea    on exertion  . Hyperlipidemia   . Hypertension   . Pneumothorax, spontaneous, tension 1960  . SOB (shortness of breath) on exertion     DVT Prophylaxis - ASA Protocol Weight-Bearing as tolerated to Left leg No vaccines.  BISSELL, JACLYN M. 10/07/2017, 10:50 AM

## 2017-10-07 NOTE — Progress Notes (Signed)
Physical Therapy Treatment Patient Details Name: Joseph [name] MRN: [id number] DOB: Apr 05, 1935 Today's Date: 10/07/2017    History of Present Illness L TKA    PT Comments    The patient required more assistance this visit than previously, complains of severe left knee pain, difficulty ambulating, noted to be diaphoretic after ambulating. Recommend SNF for rehab as patient has no assistance at home.    Follow Up Recommendations  SNF     Equipment Recommendations  Rolling walker with 5" wheels;3in1 (PT)    Recommendations for Other Services       Precautions / Restrictions Precautions Precautions: Knee;Fall Precaution Comments: reviewed no pillow under knee Required Braces or Orthoses: Knee Immobilizer - Left Knee Immobilizer - Left: Discontinue once straight leg raise with < 10 degree lag Restrictions Weight Bearing Restrictions: No    Mobility  Bed Mobility Overal bed mobility: Needs Assistance Bed Mobility: Supine to Sit;Sit to Supine     Supine to sit: Mod assist Sit to supine: Mod assist;Min assist   General bed mobility comments: Patient required more assistance today for bed mobility, Patient was diaphoretic when returned to bed.  Transfers Overall transfer level: Needs assistance Equipment used: Rolling walker (2 wheeled) Transfers: Sit to/from Stand Sit to Stand: Min assist         General transfer comment: VCs hand placement, min A to steady and rise  Ambulation/Gait Ambulation/Gait assistance: Min assist;Mod assist Ambulation Distance (Feet): 50 Feet Assistive device: Rolling walker (2 wheeled) Gait Pattern/deviations: Step-to pattern;Step-through pattern     General Gait Details: VCs to slow down, to step into frame of RW and for sequencing, patie t stopped and leaned onto RW, noted to be diaphoretic, Summoned nursing to assist back into room. RN  in rto assess and take VS which are stable   Stairs            Wheelchair Mobility     Modified Rankin (Stroke Patients Only)       Balance Overall balance assessment: Needs assistance Sitting-balance support: Feet supported;Bilateral upper extremity supported Sitting balance-Leahy Scale: Good     Standing balance support: During functional activity;Bilateral upper extremity supported Standing balance-Leahy Scale: Poor Standing balance comment: due to not staying inside RW.                            Cognition Arousal/Alertness: Awake/alert Behavior During Therapy: Impulsive Overall Cognitive Status: Within Functional Limits for tasks assessed                                 General Comments: required verbal cues for safety 2* impulsivity      Exercises      General Comments        Pertinent Vitals/Pain Pain Score: 8  Pain Location: left knee Pain Descriptors / Indicators: Aching;Discomfort Pain Intervention(s): Limited activity within patient's tolerance;Monitored during session;Patient requesting pain meds-RN notified;Ice applied    Home Living                      Prior Function            PT Goals (current goals can now be found in the care plan section) Progress towards PT goals: Not progressing toward goals - comment(patient diaphoretic today.)    Frequency    7X/week      PT Plan Discharge plan needs to  be updated    Co-evaluation              AM-PAC PT "6 Clicks" Daily Activity  Outcome Measure  Difficulty turning over in bed (including adjusting bedclothes, sheets and blankets)?: A Lot Difficulty moving from lying on back to sitting on the side of the bed? : A Lot Difficulty sitting down on and standing up from a chair with arms (e.g., wheelchair, bedside commode, etc,.)?: A Lot Help needed moving to and from a bed to chair (including a wheelchair)?: A Lot Help needed walking in hospital room?: A Lot Help needed climbing 3-5 steps with a railing? : Total 6 Click Score: 11    End  of Session Equipment Utilized During Treatment: Gait belt;Left knee immobilizer Activity Tolerance: Patient limited by pain;Treatment limited secondary to medical complications (Comment) Patient left: in bed;with call bell/phone within reach;with bed alarm set Nurse Communication: Mobility status PT Visit Diagnosis: Muscle weakness (generalized) (M62.81);Difficulty in walking, not elsewhere classified (R26.2);Pain Pain - Right/Left: Left Pain - part of body: Knee     Time: 3845-3646 PT Time Calculation (min) (ACUTE ONLY): 25 min  Charges:  $Gait Training: 23-37 mins                    G CodesTresa [name] PT [phone]    Joseph [name] 10/07/2017, 1:23 PM

## 2017-10-07 NOTE — Progress Notes (Signed)
Physical Therapy Treatment Patient Details Name: Joseph [name] MRN: [id number] DOB: Sep 15, 1934 Today's Date: 10/07/2017    History of Present Illness L TKA    PT Comments    The patient reports that pain is much improved and ambulated x 50'. Patient agreeable to go to rehab prior to DC. Continue PT.   Follow Up Recommendations  SNF     Equipment Recommendations  Rolling walker with 5" wheels;3in1 (PT)    Recommendations for Other Services       Precautions / Restrictions Precautions Precautions: Knee;Fall Precaution Comments: reviewed no pillow under knee Required Braces or Orthoses: Knee Immobilizer - Left Knee Immobilizer - Left: Discontinue once straight leg raise with < 10 degree lag    Mobility  Bed Mobility Overal bed mobility: Needs Assistance Bed Mobility: Supine to Sit     Supine to sit: Min guard Sit to supine: Mod assist;Min assist   General bed mobility comments: patient having  less pain this visit and mobilizing  with less  assistance than last visit.  Transfers Overall transfer level: Needs assistance Equipment used: Rolling walker (2 wheeled) Transfers: Sit to/from Stand Sit to Stand: Min assist         General transfer comment: VCs hand placement, min A to steady and rise  Ambulation/Gait Ambulation/Gait assistance: Min assist Ambulation Distance (Feet): 50 Feet Assistive device: Rolling walker (2 wheeled) Gait Pattern/deviations: Step-to pattern;Step-through pattern;Decreased stance time - left Gait velocity: fast and unsteady Gait velocity interpretation: at or above normal speed for age/gender General Gait Details: VCs to slow down, to step into frame of RW and for sequencing, patient  moves quickly, tolerated ambulation without stopping.   Stairs            Wheelchair Mobility    Modified Rankin (Stroke Patients Only)       Balance Overall balance assessment: Needs assistance Sitting-balance support: Feet  supported;Bilateral upper extremity supported Sitting balance-Leahy Scale: Good     Standing balance support: During functional activity;Bilateral upper extremity supported Standing balance-Leahy Scale: Poor Standing balance comment: due to not staying inside RW.                            Cognition Arousal/Alertness: Awake/alert Behavior During Therapy: Impulsive Overall Cognitive Status: Within Functional Limits for tasks assessed                                 General Comments: required verbal cues for safety 2* impulsivity      Exercises Total Joint Exercises Ankle Circles/Pumps: AROM;10 reps;Both Quad Sets: AROM;10 reps;Both Short Arc Quad: AAROM;Left;10 reps Heel Slides: AAROM;Left;10 reps Hip ABduction/ADduction: AAROM;Left Straight Leg Raises: AAROM;Left;10 reps    General Comments        Pertinent Vitals/Pain Pain Score: 5  Pain Location: left knee Pain Descriptors / Indicators: Aching;Discomfort Pain Intervention(s): Monitored during session;Premedicated before session;Ice applied    Home Living                      Prior Function            PT Goals (current goals can now be found in the care plan section) Progress towards PT goals: Progressing toward goals    Frequency    7X/week      PT Plan Current plan remains appropriate    Co-evaluation  AM-PAC PT "6 Clicks" Daily Activity  Outcome Measure  Difficulty turning over in bed (including adjusting bedclothes, sheets and blankets)?: A Lot Difficulty moving from lying on back to sitting on the side of the bed? : A Lot Difficulty sitting down on and standing up from a chair with arms (e.g., wheelchair, bedside commode, etc,.)?: A Lot Help needed moving to and from a bed to chair (including a wheelchair)?: A Lot Help needed walking in hospital room?: A Lot Help needed climbing 3-5 steps with a railing? : Total 6 Click Score: 11    End of  Session Equipment Utilized During Treatment: Gait belt;Left knee immobilizer Activity Tolerance: Patient tolerated treatment well Patient left: in chair;with call bell/phone within reach;with chair alarm set Nurse Communication: Mobility status PT Visit Diagnosis: Muscle weakness (generalized) (M62.81);Difficulty in walking, not elsewhere classified (R26.2);Pain Pain - Right/Left: Left Pain - part of body: Knee     Time: 2706-2376 PT Time Calculation (min) (ACUTE ONLY): 25 min  Charges:  $Gait Training: 8-22 mins $Therapeutic Exercise: 8-22 mins                    G CodesTresa Endo PT [phone]   Claretha Cooper 10/07/2017, 3:37 PM

## 2017-10-07 NOTE — Progress Notes (Signed)
Foley catheter removed 0940am per Md order. Pt refused to have it out at earlier time. RN will monitor.

## 2017-10-07 NOTE — Anesthesia Postprocedure Evaluation (Signed)
Anesthesia Post Note  Patient: Joseph [name]  Procedure(s) Performed: LEFT TOTAL KNEE ARTHROPLASTY (Left Knee)     Anesthesia Type: Regional and Spinal    Last Vitals:  Vitals:   10/07/17 0933 10/07/17 1125  BP: 135/60 129/63  Pulse: 86 89  Resp: 18 18  Temp: 37.3 C 36.9 C  SpO2: 97% 97%    Last Pain:  Vitals:   10/07/17 1541  TempSrc:   PainSc: 2    Pain Goal: Patients Stated Pain Goal: 3 (10/07/17 1441)               Lyndle Herrlich EDWARD

## 2017-10-07 NOTE — NC FL2 (Addendum)
[place] LEVEL OF CARE SCREENING TOOL     IDENTIFICATION  Patient Name: Joseph [name] Birthdate: 05-01-1935 Sex: male Admission Date (Current Location): 10/06/2017  [facility] and [place] Number:  Herbalist and Address:  [facility],  [place] [address], [place]      Provider Number: [id number]  Attending Physician Name and Address:  Susa Day, MD  Relative Name and Phone Number:       Current Level of Care: Hospital Recommended Level of Care: [place] Prior Approval Number:    Date Approved/Denied:   PASRR Number:   [id number]   Discharge Plan: SNF    Current Diagnoses: Patient Active Problem List   Diagnosis Date Noted  . Left knee DJD 10/06/2017  . Lung nodule 01/09/2016  . COPD (chronic obstructive pulmonary disease) ([place]) 01/09/2016  . Hyperlipidemia   . Hypertension   . Pneumothorax, spontaneous, tension   . SOB (shortness of breath) on exertion     Orientation RESPIRATION BLADDER Height & Weight     Self, Time, Situation, Place  Normal Continent Weight: 178 lb (80.7 kg) Height:  6' (182.9 cm)  BEHAVIORAL SYMPTOMS/MOOD NEUROLOGICAL BOWEL NUTRITION STATUS      Continent Diet(Regular)  AMBULATORY STATUS COMMUNICATION OF NEEDS Skin   Extensive Assist Verbally Normal                       Personal Care Assistance Level of Assistance  Bathing, Dressing, Feeding Bathing Assistance: Limited assistance Feeding assistance: Independent Dressing Assistance: Limited assistance     Functional Limitations Info  Sight, Hearing, Speech Sight Info: Adequate Hearing Info: Adequate Speech Info: Adequate    SPECIAL CARE FACTORS FREQUENCY  PT (By licensed PT), OT (By licensed OT)     PT Frequency: 5x/week OT Frequency: 5x/week            Contractures Contractures Info: Not present    Additional Factors Info  Code Status, Allergies Code Status Info: Fullcode Allergies Info:  Allergies: Ciprocin-fluocin-procin Fluocinolone Acetonide, Quinolones, Statins, Imiquimod, Penicillins           Current Medications (10/07/2017):  This is the current hospital active medication list Current Facility-Administered Medications  Medication Dose Route Frequency Provider Last Rate Last Dose  . acetaminophen (TYLENOL) tablet 325-650 mg  325-650 mg Oral Q6H PRN Susa Day, MD   650 mg at 10/07/17 1223  . acidophilus (RISAQUAD) capsule 1 capsule  1 capsule Oral Daily Susa Day, MD   1 capsule at 10/07/17 0928  . albuterol (PROVENTIL) (2.5 MG/3ML) 0.083% nebulizer solution 2.5 mg  2.5 mg Nebulization Q6H PRN Susa Day, MD      . alum & mag hydroxide-simeth (MAALOX/MYLANTA) 200-200-20 MG/5ML suspension 30 mL  30 mL Oral Q4H PRN Susa Day, MD      . amLODipine (NORVASC) tablet 5 mg  5 mg Oral Daily Susa Day, MD   5 mg at 10/07/17 0933  . aspirin chewable tablet 81 mg  81 mg Oral BID Susa Day, MD   81 mg at 10/07/17 9678  . bisacodyl (DULCOLAX) EC tablet 5 mg  5 mg Oral Daily PRN Susa Day, MD      . dextrose 5 % and 0.45 % NaCl with KCl 20 mEq/L infusion   Intravenous Continuous Susa Day, MD 50 mL/hr at 10/06/17 1412    . diphenhydrAMINE (BENADRYL) 12.5 MG/5ML elixir 12.5-25 mg  12.5-25 mg Oral Q4H PRN Susa Day, MD      .  docusate sodium (COLACE) capsule 100 mg  100 mg Oral BID Susa Day, MD   100 mg at 10/07/17 0928  . dutasteride (AVODART) capsule 0.5 mg  0.5 mg Oral Daily Susa Day, MD   0.5 mg at 10/07/17 4259   And  . tamsulosin (FLOMAX) capsule 0.4 mg  0.4 mg Oral Daily Susa Day, MD   0.4 mg at 10/07/17 5638  . HYDROmorphone (DILAUDID) injection 0.5-1 mg  0.5-1 mg Intravenous Q4H PRN Susa Day, MD   0.5 mg at 10/07/17 1212  . ibuprofen (ADVIL,MOTRIN) tablet 800 mg  800 mg Oral TID PRN Susa Day, MD   800 mg at 10/07/17 1132  . levothyroxine (SYNTHROID, LEVOTHROID) tablet 125 mcg  125 mcg Oral QAC breakfast  Susa Day, MD   125 mcg at 10/07/17 0932  . magnesium citrate solution 1 Bottle  1 Bottle Oral Once PRN Susa Day, MD      . magnesium oxide (MAG-OX) tablet 400 mg  400 mg Oral Daily Susa Day, MD   400 mg at 10/07/17 7564  . menthol-cetylpyridinium (CEPACOL) lozenge 3 mg  1 lozenge Oral PRN Susa Day, MD       Or  . phenol (CHLORASEPTIC) mouth spray 1 spray  1 spray Mouth/Throat PRN Susa Day, MD      . methocarbamol (ROBAXIN) tablet 500 mg  500 mg Oral Q6H PRN Susa Day, MD   500 mg at 10/07/17 1223   Or  . methocarbamol (ROBAXIN) 500 mg in dextrose 5 % 50 mL IVPB  500 mg Intravenous Q6H PRN Susa Day, MD   Stopped at 10/06/17 1200  . metoCLOPramide (REGLAN) tablet 5-10 mg  5-10 mg Oral Q8H PRN Susa Day, MD       Or  . metoCLOPramide (REGLAN) injection 5-10 mg  5-10 mg Intravenous Q8H PRN Susa Day, MD      . ondansetron (ZOFRAN) tablet 4 mg  4 mg Oral Q6H PRN Susa Day, MD   4 mg at 10/07/17 1031   Or  . ondansetron (ZOFRAN) injection 4 mg  4 mg Intravenous Q6H PRN Susa Day, MD      . oxyCODONE (Oxy IR/ROXICODONE) immediate release tablet 10-15 mg  10-15 mg Oral Q4H PRN Susa Day, MD      . oxyCODONE (Oxy IR/ROXICODONE) immediate release tablet 5-10 mg  5-10 mg Oral Q4H PRN Susa Day, MD   5 mg at 10/07/17 3329  . pantoprazole (PROTONIX) EC tablet 40 mg  40 mg Oral Daily Susa Day, MD   40 mg at 10/07/17 5188  . polyethylene glycol (MIRALAX / GLYCOLAX) packet 17 g  17 g Oral Daily PRN Susa Day, MD      . tiotropium (SPIRIVA) inhalation capsule 18 mcg  1 capsule Inhalation Daily Susa Day, MD   18 mcg at 10/07/17 4166     Discharge Medications: Please see discharge summary for a list of discharge medications.  Relevant Imaging Results:  Relevant Lab Results:   Additional Information AYT:[id number]  Lia Hopping, LCSW

## 2017-10-07 NOTE — Clinical Social Work Note (Signed)
Clinical Social Work Assessment  Patient Details  Name: Joseph [name] MRN: [id number] Date of Birth: 09/14/1934  Date of referral:  10/07/17               Reason for consult:  Facility Placement                Permission sought to share information with:    Permission granted to share information::  Yes, Verbal Permission Granted  Name::        Agency::  SNF  Relationship::     Contact Information:     Housing/Transportation Living arrangements for the past 2 months:    Source of Information:  Patient Patient Interpreter Needed:  None Criminal Activity/Legal Involvement Pertinent to Current Situation/Hospitalization:  No - Comment as needed Significant Relationships:    Lives with:  Siblings Do you feel safe going back to the place where you live?  Yes Need for family participation in patient care:  Yes (Dependent with mobility)  Care giving concerns:   SNF placement for short rehab.   Social Worker assessment / plan:  CSW met with patient at bedside explained role and reason for visit- to assist with discharge to SNF. Patient agreeable to SNF stay for short rehab. Patient report this if his first time going to SNF.CSW explain the SNF and insurance process. The patient lives alone, progressing slowly with pt and does not feel safe going home without support. The patient express feeling a lot of pain and needed medication and to rest. He gave CSW permission to send out information to facilities in [place].   Plan: SNF  Employment status:  Retired Insurance information:  ([employer]) PT Recommendations:  Skilled Nursing Facility Information / Referral to community resources:     Patient/Family's Response to care: Patient agreeable to CSW assistance with finding a SNF for rehab.   Patient/Family's Understanding of and Emotional Response to Diagnosis, Current Treatment, and Prognosis: Patient alert and oriented x4. Patient has a good understanding of his current  diagnosis and current treatment plan to regain strength and transition home.   Emotional Assessment Appearance:  Appears younger than stated age Attitude/Demeanor/Rapport:  Other(In pain ) Affect (typically observed):  Accepting Orientation:  Oriented to Self, Oriented to Place, Oriented to  Time, Oriented to Situation Alcohol / Substance use:  Not Applicable Psych involvement (Current and /or in the community):  No (Comment)  Discharge Needs  Concerns to be addressed:  Discharge Planning Concerns Readmission within the last 30 days:  No Current discharge risk:  Dependent with Mobility, Lack of support system Barriers to Discharge:  Continued Medical Work up, Insurance Authorization   Nicole A Sinclair, LCSW 10/07/2017, 12:41 PM  

## 2017-10-08 DIAGNOSIS — E785 Hyperlipidemia, unspecified: Secondary | ICD-10-CM | POA: Diagnosis not present

## 2017-10-08 DIAGNOSIS — Z471 Aftercare following joint replacement surgery: Secondary | ICD-10-CM | POA: Diagnosis not present

## 2017-10-08 DIAGNOSIS — R278 Other lack of coordination: Secondary | ICD-10-CM | POA: Diagnosis not present

## 2017-10-08 DIAGNOSIS — N401 Enlarged prostate with lower urinary tract symptoms: Secondary | ICD-10-CM | POA: Diagnosis not present

## 2017-10-08 DIAGNOSIS — E034 Atrophy of thyroid (acquired): Secondary | ICD-10-CM | POA: Diagnosis not present

## 2017-10-08 DIAGNOSIS — Z96652 Presence of left artificial knee joint: Secondary | ICD-10-CM | POA: Diagnosis not present

## 2017-10-08 DIAGNOSIS — K5909 Other constipation: Secondary | ICD-10-CM | POA: Diagnosis not present

## 2017-10-08 DIAGNOSIS — R488 Other symbolic dysfunctions: Secondary | ICD-10-CM | POA: Diagnosis not present

## 2017-10-08 DIAGNOSIS — G8911 Acute pain due to trauma: Secondary | ICD-10-CM | POA: Diagnosis not present

## 2017-10-08 DIAGNOSIS — K219 Gastro-esophageal reflux disease without esophagitis: Secondary | ICD-10-CM | POA: Diagnosis not present

## 2017-10-08 DIAGNOSIS — M6281 Muscle weakness (generalized): Secondary | ICD-10-CM | POA: Diagnosis not present

## 2017-10-08 DIAGNOSIS — M1712 Unilateral primary osteoarthritis, left knee: Secondary | ICD-10-CM | POA: Diagnosis not present

## 2017-10-08 DIAGNOSIS — S8002XA Contusion of left knee, initial encounter: Secondary | ICD-10-CM | POA: Diagnosis not present

## 2017-10-08 DIAGNOSIS — J449 Chronic obstructive pulmonary disease, unspecified: Secondary | ICD-10-CM | POA: Diagnosis not present

## 2017-10-08 DIAGNOSIS — N138 Other obstructive and reflux uropathy: Secondary | ICD-10-CM | POA: Diagnosis not present

## 2017-10-08 DIAGNOSIS — J930 Spontaneous tension pneumothorax: Secondary | ICD-10-CM | POA: Diagnosis not present

## 2017-10-08 DIAGNOSIS — I10 Essential (primary) hypertension: Secondary | ICD-10-CM | POA: Diagnosis not present

## 2017-10-08 DIAGNOSIS — J432 Centrilobular emphysema: Secondary | ICD-10-CM | POA: Diagnosis not present

## 2017-10-08 LAB — CBC
HEMATOCRIT: 35 % — AB (ref 39.0–52.0)
HEMOGLOBIN: 11.5 g/dL — AB (ref 13.0–17.0)
MCH: 29.6 pg (ref 26.0–34.0)
MCHC: 32.9 g/dL (ref 30.0–36.0)
MCV: 90.2 fL (ref 78.0–100.0)
Platelets: 135 10*3/uL — ABNORMAL LOW (ref 150–400)
RBC: 3.88 MIL/uL — ABNORMAL LOW (ref 4.22–5.81)
RDW: 14.1 % (ref 11.5–15.5)
WBC: 10 10*3/uL (ref 4.0–10.5)

## 2017-10-08 MED ORDER — OXYCODONE HCL 5 MG PO TABS: 5.0000 mg | ORAL_TABLET | ORAL | 0 refills | Status: DC | PRN

## 2017-10-08 MED ORDER — POLYETHYLENE GLYCOL 3350 17 G PO PACK: 17.0000 g | PACK | Freq: Every day | ORAL | 0 refills | Status: DC | PRN

## 2017-10-08 MED ORDER — DOCUSATE SODIUM 100 MG PO CAPS: 100.0000 mg | ORAL_CAPSULE | Freq: Two times a day (BID) | ORAL | 1 refills | Status: DC

## 2017-10-08 MED ORDER — IBUPROFEN 800 MG PO TABS: 800.0000 mg | ORAL_TABLET | Freq: Three times a day (TID) | ORAL | 0 refills | Status: DC | PRN

## 2017-10-08 NOTE — Clinical Social Work Placement (Addendum)
HTA authorization received, approved for 6 days [id number]. PTAR arranged to transport at 1:00pm.   CLINICAL SOCIAL WORK PLACEMENT  NOTE  Date:  10/08/2017  Patient Details  Name: Joseph [name] MRN: [id number] Date of Birth: 1934/10/25  Clinical Social Work is seeking post-discharge placement for this patient at the [place] level of care (*CSW will initial, date and re-position this form in  chart as items are completed):  Yes   Patient/family provided with [place] Work Department's list of facilities offering this level of care within the geographic area requested by the patient (or if unable, by the patient's family).  Yes   Patient/family informed of their freedom to choose among providers that offer the needed level of care, that participate in Medicare, Medicaid or managed care program needed by the patient, have an available bed and are willing to accept the patient.  Yes   Patient/family informed of [place]'s ownership interest in [facility] and [facility], as well as of the fact that they are under no obligation to receive care at these facilities.  PASRR submitted to EDS on       PASRR number received on       Existing PASRR number confirmed on 10/07/17     FL2 transmitted to all facilities in geographic area requested by pt/family on       FL2 transmitted to all facilities within larger geographic area on 10/08/17     Patient informed that his/her managed care company has contracts with or will negotiate with certain facilities, including the following:  [facility]     No   Patient/family informed of bed offers received.  Patient chooses bed at [facility]     Physician recommends and patient chooses bed at      Patient to be transferred to [facility] on 10/08/17.  Patient to be transferred to facility by PTAR      Patient family notified on 10/08/17 of  transfer.  Name of family member notified:  Patient notified friend.      PHYSICIAN Please prepare priority discharge summary, including medications     Additional Comment:    _______________________________________________ Lia Hopping, LCSW 10/08/2017, 10:25 AM

## 2017-10-08 NOTE — Progress Notes (Signed)
Patient ID: Joseph [name], male   DOB: 1935-03-30, 83 y.o.   MRN: [id number] Subjective: 2 Days Post-Op Procedure(s) (LRB): LEFT TOTAL KNEE ARTHROPLASTY (Left) Patient reports pain as mild and moderate.    Patient has complaints of L knee pain. No other c/o. Some better today. Hoping to D/C today. Plan for SNF.  Objective: Vital signs in last 24 hours: Temp:  [98.2 F (36.8 C)-99.1 F (37.3 C)] 98.4 F (36.9 C) (04/05 0523) Pulse Rate:  [71-89] 71 (04/05 0523) Resp:  [18] 18 (04/05 0523) BP: (112-135)/(60-64) 112/64 (04/05 0523) SpO2:  [96 %-97 %] 96 % (04/05 0755)  Intake/Output from previous day:  Intake/Output Summary (Last 24 hours) at 10/08/2017 0851 Last data filed at 10/08/2017 0848 Gross per 24 hour  Intake 780 ml  Output 1050 ml  Net -270 ml    Intake/Output this shift: Total I/O In: 240 [P.O.:240] Out: 150 [Urine:150]  Labs: Results for orders placed or performed during the hospital encounter of 10/06/17  CBC  Result Value Ref Range   WBC 9.8 4.0 - 10.5 K/uL   RBC 4.19 (L) 4.22 - 5.81 MIL/uL   Hemoglobin 12.4 (L) 13.0 - 17.0 g/dL   HCT 38.2 (L) 39.0 - 52.0 %   MCV 91.2 78.0 - 100.0 fL   MCH 29.6 26.0 - 34.0 pg   MCHC 32.5 30.0 - 36.0 g/dL   RDW 14.2 11.5 - 15.5 %   Platelets 145 (L) 150 - 400 K/uL  Basic metabolic panel  Result Value Ref Range   Sodium 141 135 - 145 mmol/L   Potassium 4.3 3.5 - 5.1 mmol/L   Chloride 107 101 - 111 mmol/L   CO2 29 22 - 32 mmol/L   Glucose, Bld 123 (H) 65 - 99 mg/dL   BUN 10 6 - 20 mg/dL   Creatinine, Ser 0.82 0.61 - 1.24 mg/dL   Calcium 8.4 (L) 8.9 - 10.3 mg/dL   GFR calc non Af Amer >60 >60 mL/min   GFR calc Af Amer >60 >60 mL/min   Anion gap 5 5 - 15  CBC  Result Value Ref Range   WBC 10.0 4.0 - 10.5 K/uL   RBC 3.88 (L) 4.22 - 5.81 MIL/uL   Hemoglobin 11.5 (L) 13.0 - 17.0 g/dL   HCT 35.0 (L) 39.0 - 52.0 %   MCV 90.2 78.0 - 100.0 fL   MCH 29.6 26.0 - 34.0 pg   MCHC 32.9 30.0 - 36.0 g/dL   RDW 14.1 11.5 - 15.5  %   Platelets 135 (L) 150 - 400 K/uL    Exam - Neurologically intact ABD soft Neurovascular intact Sensation intact distally Intact pulses distally Dorsiflexion/Plantar flexion intact Incision: dressing C/D/I and no drainage No cellulitis present Compartment soft no calf pain or sign of DVT Dressing/Incision - clean, dry, no drainage Motor function intact - moving foot and toes well on exam.   Assessment/Plan: 2 Days Post-Op Procedure(s) (LRB): LEFT TOTAL KNEE ARTHROPLASTY (Left)  Advance diet Up with therapy D/C IV fluids Past Medical History:  Diagnosis Date  . Arthritis   . COPD (chronic obstructive pulmonary disease) ([place])   . Dyspnea    on exertion  . Hyperlipidemia   . Hypertension   . Pneumothorax, spontaneous, tension 1960  . SOB (shortness of breath) on exertion     DVT Prophylaxis - ASA Protocol Weight-Bearing as tolerated to L leg Plan to D/C to SNF Ok to D/C today if bed available otherwise tomorrow Will discuss with  Dr Mliss Fritz, Caidyn Blossom M. 10/08/2017, 8:51 AM

## 2017-10-08 NOTE — Discharge Summary (Signed)
Physician Discharge Summary   Patient ID: Joseph [name] MRN: [id number] DOB/AGE: October 23, 1934 83 y.o.  Admit date: 10/06/2017 Discharge date:   Primary Diagnosis: left knee primary osteoarthritis  Admission Diagnoses:  Past Medical History:  Diagnosis Date  . Arthritis   . COPD (chronic obstructive pulmonary disease) ([place])   . Dyspnea    on exertion  . Hyperlipidemia   . Hypertension   . Pneumothorax, spontaneous, tension 1960  . SOB (shortness of breath) on exertion    Discharge Diagnoses:   Active Problems:   Left knee DJD  Estimated body mass index is 24.14 kg/m as calculated from the following:   Height as of this encounter: 6' (1.829 m).   Weight as of this encounter: 80.7 kg (178 lb).  Procedure:  Procedure(s) (LRB): LEFT TOTAL KNEE ARTHROPLASTY (Left)   Consults: None  HPI: see H&P Laboratory Data: Admission on 10/06/2017  Component Date Value Ref Range Status  . WBC 10/07/2017 9.8  4.0 - 10.5 K/uL Final  . RBC 10/07/2017 4.19* 4.22 - 5.81 MIL/uL Final  . Hemoglobin 10/07/2017 12.4* 13.0 - 17.0 g/dL Final  . HCT 10/07/2017 38.2* 39.0 - 52.0 % Final  . MCV 10/07/2017 91.2  78.0 - 100.0 fL Final  . MCH 10/07/2017 29.6  26.0 - 34.0 pg Final  . MCHC 10/07/2017 32.5  30.0 - 36.0 g/dL Final  . RDW 10/07/2017 14.2  11.5 - 15.5 % Final  . Platelets 10/07/2017 145* 150 - 400 K/uL Final   Performed at [facility], [place] [address] [place], [place] [address]  . Sodium 10/07/2017 141  135 - 145 mmol/L Final  . Potassium 10/07/2017 4.3  3.5 - 5.1 mmol/L Final  . Chloride 10/07/2017 107  101 - 111 mmol/L Final  . CO2 10/07/2017 29  22 - 32 mmol/L Final  . Glucose, Bld 10/07/2017 123* 65 - 99 mg/dL Final  . BUN 10/07/2017 10  6 - 20 mg/dL Final  . Creatinine, Ser 10/07/2017 0.82  0.61 - 1.24 mg/dL Final  . Calcium 10/07/2017 8.4* 8.9 - 10.3 mg/dL Final  . GFR calc non Af Amer 10/07/2017 >60  >60 mL/min Final  . GFR calc Af Amer 10/07/2017 >60  >60  mL/min Final   Comment: (NOTE) The eGFR has been calculated using the CKD EPI equation. This calculation has not been validated in all clinical situations. eGFR's persistently <60 mL/min signify possible Chronic Kidney Disease.   Georgiann Hahn gap 10/07/2017 5  5 - 15 Final   Performed at [facility], [place] [address] [place], [place] [address]  . WBC 10/08/2017 10.0  4.0 - 10.5 K/uL Final  . RBC 10/08/2017 3.88* 4.22 - 5.81 MIL/uL Final  . Hemoglobin 10/08/2017 11.5* 13.0 - 17.0 g/dL Final  . HCT 10/08/2017 35.0* 39.0 - 52.0 % Final  . MCV 10/08/2017 90.2  78.0 - 100.0 fL Final  . MCH 10/08/2017 29.6  26.0 - 34.0 pg Final  . MCHC 10/08/2017 32.9  30.0 - 36.0 g/dL Final  . RDW 10/08/2017 14.1  11.5 - 15.5 % Final  . Platelets 10/08/2017 135* 150 - 400 K/uL Final   Performed at [facility], [place] [address], [place], [place] [address]  Hospital Outpatient Visit on 10/04/2017  Component Date Value Ref Range Status  . MRSA, PCR 10/04/2017 NEGATIVE  NEGATIVE Final  . Staphylococcus aureus 10/04/2017 NEGATIVE  NEGATIVE Final   Comment: (NOTE) The Xpert SA Assay (FDA approved for NASAL specimens in  patients 13 years of age and older), is one component of a comprehensive surveillance program. It is not intended to diagnose infection nor to guide or monitor treatment. Performed at [facility], [place] [address], [place], [place] [address]   . aPTT 10/04/2017 30  24 - 36 seconds Final   Performed at [facility], [place] [address], [place], [place] [address]  . Sodium 10/04/2017 140  135 - 145 mmol/L Final  . Potassium 10/04/2017 4.1  3.5 - 5.1 mmol/L Final  . Chloride 10/04/2017 104  101 - 111 mmol/L Final  . CO2 10/04/2017 29  22 - 32 mmol/L Final  . Glucose, Bld 10/04/2017 97  65 - 99 mg/dL Final  . BUN 10/04/2017 14  6 - 20 mg/dL Final  . Creatinine, Ser 10/04/2017 0.93  0.61 - 1.24 mg/dL Final  . Calcium 10/04/2017  9.6  8.9 - 10.3 mg/dL Final  . GFR calc non Af Amer 10/04/2017 >60  >60 mL/min Final  . GFR calc Af Amer 10/04/2017 >60  >60 mL/min Final   Comment: (NOTE) The eGFR has been calculated using the CKD EPI equation. This calculation has not been validated in all clinical situations. eGFR's persistently <60 mL/min signify possible Chronic Kidney Disease.   Georgiann Hahn gap 10/04/2017 7  5 - 15 Final   Performed at [facility], [place] [address], [place], [place] [address]  . WBC 10/04/2017 7.5  4.0 - 10.5 K/uL Final  . RBC 10/04/2017 5.32  4.22 - 5.81 MIL/uL Final  . Hemoglobin 10/04/2017 16.0  13.0 - 17.0 g/dL Final  . HCT 10/04/2017 48.3  39.0 - 52.0 % Final  . MCV 10/04/2017 90.8  78.0 - 100.0 fL Final  . MCH 10/04/2017 30.1  26.0 - 34.0 pg Final  . MCHC 10/04/2017 33.1  30.0 - 36.0 g/dL Final  . RDW 10/04/2017 14.2  11.5 - 15.5 % Final  . Platelets 10/04/2017 170  150 - 400 K/uL Final   Performed at [facility], [place] [address] [place], [place] [address]  . Prothrombin Time 10/04/2017 14.0  11.4 - 15.2 seconds Final  . INR 10/04/2017 1.09   Final   Performed at [facility], [place] [address], [place], [place] [address]  . Color, Urine 10/04/2017 YELLOW  YELLOW Final  . APPearance 10/04/2017 CLEAR  CLEAR Final  . Specific Gravity, Urine 10/04/2017 1.019  1.005 - 1.030 Final  . pH 10/04/2017 5.0  5.0 - 8.0 Final  . Glucose, UA 10/04/2017 NEGATIVE  NEGATIVE mg/dL Final  . Hgb urine dipstick 10/04/2017 NEGATIVE  NEGATIVE Final  . Bilirubin Urine 10/04/2017 NEGATIVE  NEGATIVE Final  . Ketones, ur 10/04/2017 NEGATIVE  NEGATIVE mg/dL Final  . Protein, ur 10/04/2017 NEGATIVE  NEGATIVE mg/dL Final  . Nitrite 10/04/2017 NEGATIVE  NEGATIVE Final  . Leukocytes, UA 10/04/2017 SMALL* NEGATIVE Final  . RBC / HPF 10/04/2017 0-5  0 - 5 RBC/hpf Final  . WBC, UA 10/04/2017 6-30  0 - 5 WBC/hpf Final  . Bacteria, UA 10/04/2017 RARE* NONE SEEN Final  .  Squamous Epithelial / LPF 10/04/2017 0-5* NONE SEEN Final  . Mucus 10/04/2017 PRESENT   Final  . Hyaline Casts, UA 10/04/2017 PRESENT   Final   Performed at [facility], [place] [address] [place], [place] [address]     X-Rays:Dg Knee Left Port  Result Date: 10/06/2017 CLINICAL DATA:  Status post total knee replacement EXAM: PORTABLE LEFT KNEE -  1-2 VIEW COMPARISON:  None. FINDINGS: Frontal and lateral views were obtained. There is a total knee replacement with femoral and tibial prosthetic components well-seated. No acute fracture or dislocation. No erosive change. Soft tissue air within the knee joint is an expected postoperative finding. IMPRESSION: Total knee replacement with prosthetic components well-seated. No acute fracture or dislocation. Electronically Signed   By: Lowella Grip III M.D.   On: 10/06/2017 11:52    EKG: Orders placed or performed during the hospital encounter of 10/04/17  . EKG 12 lead  . EKG 12 lead     Hospital Course: JOVEN MOM is a 32 y.o. who was admitted to [facility]. They were brought to the operating room on 10/06/2017 and underwent Procedure(s): LEFT TOTAL KNEE ARTHROPLASTY.  Patient tolerated the procedure well and was later transferred to the recovery room and then to the orthopaedic floor for postoperative care.  They were given PO and IV analgesics for pain control following their surgery.  They were given 24 hours of postoperative antibiotics of  Anti-infectives (From admission, onward)   Start     Dose/Rate Route Frequency Ordered Stop   10/06/17 1500  ceFAZolin (ANCEF) IVPB 2g/100 mL premix     2 g 200 mL/hr over 30 Minutes Intravenous Every 6 hours 10/06/17 1308 10/06/17 2223   10/06/17 0906  polymyxin B 500,000 Units, bacitracin 50,000 Units in sodium chloride 0.9 % 500 mL irrigation  Status:  Discontinued       As needed 10/06/17 0929 10/06/17 1057   10/06/17 0645  ceFAZolin (ANCEF) IVPB 2g/100 mL premix     2  g 200 mL/hr over 30 Minutes Intravenous On call to O.R. 10/06/17 4627 10/06/17 0915   10/06/17 0643  ceFAZolin (ANCEF) 2-4 GM/100ML-% IVPB    Note to Pharmacy:  Waldron Session   : cabinet override      10/06/17 0643 10/06/17 0845     and started on DVT prophylaxis in the form of Aspirin, TED hose and SCDs.   PT and OT were ordered for total joint protocol.  Discharge planning consulted to help with postop disposition and equipment needs.  Patient had a good night on the evening of surgery.  They started to get up OOB with therapy on day one.  Continued to work with therapy into day two.   By day three, the patient had progressed with therapy and meeting their goals.  Incision was healing well.  Patient was seen in rounds and was ready to for D/C to SNF on day 2.   Diet: Regular diet Activity:WBAT Follow-up:in 10-14 days Disposition - Rehab Discharged Condition: good   Discharge Instructions    Call MD / Call 911   Complete by:  As directed    If you experience chest pain or shortness of breath, CALL 911 and be transported to the hospital emergency room.  If you develope a fever above 101 F, pus (white drainage) or increased drainage or redness at the wound, or calf pain, call your surgeon's office.   Constipation Prevention   Complete by:  As directed    Drink plenty of fluids.  Prune juice may be helpful.  You may use a stool softener, such as Colace (over the counter) 100 mg twice a day.  Use MiraLax (over the counter) for constipation as needed.   Diet - low sodium heart healthy   Complete by:  As directed    Increase activity slowly as tolerated   Complete by:  As directed      Allergies as of 10/08/2017      Reactions   Ciprocin-fluocin-procin [fluocinolone Acetonide] Other (See Comments)   Blisters   Quinolones Other (See Comments)   Pt had to go to hospital   Statins Other (See Comments)   Legs hurt   Imiquimod Other (See Comments)   Blisters   Penicillins Itching, Rash,  Other (See Comments)   Has patient had a PCN reaction causing immediate rash, facial/tongue/throat swelling, SOB or lightheadedness with hypotension: Yes Has patient had a PCN reaction causing severe rash involving mucus membranes or skin necrosis: No Has patient had a PCN reaction that required hospitalization: No Has patient had a PCN reaction occurring within the last 10 years: No If all of the above answers are "NO", then may proceed with Cephalosporin use.      Medication List    STOP taking these medications   aspirin 81 MG tablet   lovastatin 40 MG tablet Commonly known as:  MEVACOR     TAKE these medications   albuterol 108 (90 Base) MCG/ACT inhaler Commonly known as:  PROVENTIL HFA;VENTOLIN HFA Inhale 1 puff into the lungs every 6 (six) hours as needed for wheezing or shortness of breath.   amLODipine 5 MG tablet Commonly known as:  NORVASC Take 1 tablet (5 mg total) by mouth daily.   diphenhydrAMINE 25 MG tablet Commonly known as:  BENADRYL Take 2 tablets (50 mg total) by mouth every 4 (four) hours as needed (swelling and itching).   docusate sodium 100 MG capsule Commonly known as:  COLACE Take 1 capsule (100 mg total) by mouth 2 (two) times daily.   famotidine 20 MG tablet Commonly known as:  PEPCID Take 1 tablet (20 mg total) by mouth 2 (two) times daily.   hydrochlorothiazide 12.5 MG capsule Commonly known as:  MICROZIDE TAKE ONE CAPSULE BY MOUTH EVERY DAY   ibuprofen 800 MG tablet Commonly known as:  ADVIL,MOTRIN Take 1 tablet (800 mg total) by mouth 3 (three) times daily as needed for mild pain (not relieved by acetaminophen).   JALYN 0.5-0.4 MG Caps Generic drug:  Dutasteride-Tamsulosin HCl Take 1 capsule by mouth daily.   magnesium oxide 400 MG tablet Commonly known as:  MAG-OX Take 400 mg by mouth daily.   omeprazole 20 MG capsule Commonly known as:  PRILOSEC Take 20 mg by mouth daily.   oxyCODONE 5 MG immediate release tablet Commonly  known as:  Oxy IR/ROXICODONE Take 1-2 tablets (5-10 mg total) by mouth every 4 (four) hours as needed for moderate pain or severe pain.   polyethylene glycol packet Commonly known as:  MIRALAX / GLYCOLAX Take 17 g by mouth daily as needed for mild constipation.   SPIRIVA RESPIMAT 2.5 MCG/ACT Aers Generic drug:  Tiotropium Bromide Monohydrate Inhale 1 puff into the lungs daily.   SYNTHROID 125 MCG tablet Generic drug:  levothyroxine Take 125 mcg by mouth daily.       Contact information for follow-up providers    Susa Day, MD Follow up in 2 week(s).   Specialty:  Orthopedic Surgery Contact information: [address] [place] [address] [place] [address] [phone]            Contact information for after-discharge care    Destination    [facility] .   Service:  Skilled Nursing Contact information: [address] [place] [phone]  Signed: Lacie Draft, PA-C Orthopaedic Surgery 10/08/2017, 10:37 AM

## 2017-10-08 NOTE — Progress Notes (Signed)
Physical Therapy Treatment Patient Details Name: Joseph [name] MRN: [id number] DOB: 1935-06-18 Today's Date: 10/08/2017    History of Present Illness L TKA    PT Comments    POD # 2 am session Applied KI and instructed on use for amb until able to perform active SLR.  Assisted with amb a limited distance.  Pt required much VC's to decreased gait speed and increase stance time L LE.  Pt impulsive.  At end of walk, pt became more unsteady and c/o Mod dizziness and max fatigue..I have been unable to reach this patient by phone.  A letter is being sent. To get a standing BP due to need to sit.  Stiing EOB 96/54.  Reported to RN and assist back to bed   Follow Up Recommendations  SNF     Equipment Recommendations       Recommendations for Other Services       Precautions / Restrictions Precautions Precautions: Knee;Fall Precaution Comments: reviewed no pillow under knee and KI use for amb until able to perform SLR Required Braces or Orthoses: Knee Immobilizer - Left Knee Immobilizer - Left: Discontinue once straight leg raise with < 10 degree lag Restrictions Weight Bearing Restrictions: No Other Position/Activity Restrictions: WBAT    Mobility  Bed Mobility Overal bed mobility: Needs Assistance Bed Mobility: Sit to Supine     Supine to sit: Min guard Sit to supine: Min assist   General bed mobility comments: assist L LE up onto bed  Transfers Overall transfer level: Needs assistance Equipment used: Rolling walker (2 wheeled) Transfers: Sit to/from Stand Sit to Stand: Min assist         General transfer comment: 25% VC's on proper hand placement esp with stand to sit and turn completion.  Ambulation/Gait Ambulation/Gait assistance: Min assist Ambulation Distance (Feet): 45 Feet Assistive device: Rolling walker (2 wheeled) Gait Pattern/deviations: Step-to pattern;Step-through pattern;Decreased stance time - left Gait velocity: fast and unsteady   General Gait  Details: 50% VC's to decreased gait speed and increase stance time L LE.  Pt impulsive.  Pt c/o increased dizziness at end of walk.  Unable to get a standing BP due to pt's need to sit.  sitting EOB with B LE on floor 96/54.  Reported to RN.   Stairs            Wheelchair Mobility    Modified Rankin (Stroke Patients Only)       Balance                                            Cognition Arousal/Alertness: Awake/alert Behavior During Therapy: WFL for tasks assessed/performed Overall Cognitive Status: Within Functional Limits for tasks assessed                                 General Comments: cues for safety      Exercises      General Comments        Pertinent Vitals/Pain Pain Assessment: 0-10 Pain Score: 4  Pain Location: left knee Pain Descriptors / Indicators: Sore;Operative site guarding Pain Intervention(s): Monitored during session;Premedicated before session;Repositioned;Ice applied    Home Living Family/patient expects to be discharged to:: Skilled nursing facility Living Arrangements: Alone           Home Equipment: None  Prior Function Level of Independence: Independent      Comments: drives, independent ADLs   PT Goals (current goals can now be found in the care plan section) Acute Rehab PT Goals Patient Stated Goal: to drive Progress towards PT goals: Progressing toward goals    Frequency    7X/week      PT Plan Current plan remains appropriate    Co-evaluation              AM-PAC PT "6 Clicks" Daily Activity  Outcome Measure  Difficulty turning over in bed (including adjusting bedclothes, sheets and blankets)?: A Lot Difficulty moving from lying on back to sitting on the side of the bed? : A Lot Difficulty sitting down on and standing up from a chair with arms (e.g., wheelchair, bedside commode, etc,.)?: A Lot Help needed moving to and from a bed to chair (including a  wheelchair)?: A Lot Help needed walking in hospital room?: A Lot Help needed climbing 3-5 steps with a railing? : Total 6 Click Score: 11    End of Session Equipment Utilized During Treatment: Gait belt;Left knee immobilizer Activity Tolerance: Other (comment)(dizzy) Patient left: in bed Nurse Communication: Mobility status(pt dizzy) PT Visit Diagnosis: Muscle weakness (generalized) (M62.81);Difficulty in walking, not elsewhere classified (R26.2);Pain Pain - Right/Left: Left Pain - part of body: Knee     Time: 6147-0929 PT Time Calculation (min) (ACUTE ONLY): 30 min  Charges:  $Gait Training: 8-22 mins $Therapeutic Activity: 8-22 mins                    G Codes:       Rica Koyanagi  PTA [facility]  Acute  Rehab Pager      [phone]

## 2017-10-08 NOTE — Evaluation (Signed)
Occupational Therapy Evaluation Patient Details Name: Joseph [name] MRN: [id number] DOB: 1934/09/06 Today's Date: 10/08/2017    History of Present Illness L TKA   Clinical Impression   This 83 year old man was admitted for the above sx. He will benefit from continued OT in acute and at SNF to restore independence and be able to return home alone safely.  Pt needs cues for safety as he tends to move quickly and not wait for all cues to be given    Follow Up Recommendations  SNF    Equipment Recommendations  3 in 1 bedside commode    Recommendations for Other Services       Precautions / Restrictions Precautions Precautions: Knee;Fall Precaution Comments: reviewed no pillow under knee Required Braces or Orthoses: Knee Immobilizer - Left Knee Immobilizer - Left: Discontinue once straight leg raise with < 10 degree lag Restrictions Other Position/Activity Restrictions: WBAT      Mobility Bed Mobility         Supine to sit: Min guard     General bed mobility comments: with KI on  Transfers   Equipment used: Rolling walker (2 wheeled)   Sit to Stand: Min assist         General transfer comment: light assistance to stand and steady. Cues for safety, UE/LE placement    Balance                                           ADL either performed or assessed with clinical judgement   ADL Overall ADL's : Needs assistance/impaired Eating/Feeding: Independent   Grooming: Supervision/safety;Standing   Upper Body Bathing: Set up;Sitting   Lower Body Bathing: Moderate assistance;Sit to/from stand   Upper Body Dressing : Set up;Sitting   Lower Body Dressing: Maximal assistance;Sit to/from stand   Toilet Transfer: Minimal assistance;Stand-pivot;RW(chair)   Toileting- Water quality scientist and Hygiene: Sit to/from stand;Minimal assistance         General ADL Comments: performed ADL and educated on reacher.  Reviewed knee precautions and safety      Vision         Perception     Praxis      Pertinent Vitals/Pain Pain Score: 2  Pain Location: left knee Pain Descriptors / Indicators: Sore Pain Intervention(s): Limited activity within patient's tolerance;Monitored during session;Premedicated before session;Repositioned(removed ice)     Hand Dominance Right   Extremity/Trunk Assessment Upper Extremity Assessment Upper Extremity Assessment: Overall WFL for tasks assessed           Communication Communication Communication: HOH   Cognition Arousal/Alertness: Awake/alert Behavior During Therapy: Impulsive Overall Cognitive Status: Within Functional Limits for tasks assessed                                 General Comments: cues for safety   General Comments       Exercises     Shoulder Instructions      Home Living Family/patient expects to be discharged to:: Skilled nursing facility Living Arrangements: Alone                           Home Equipment: None          Prior Functioning/Environment Level of Independence: Independent        Comments:  drives, independent ADLs        OT Problem List: Decreased strength;Decreased knowledge of use of DME or AE;Pain;Decreased safety awareness      OT Treatment/Interventions: Self-care/ADL training;DME and/or AE instruction;Patient/family education    OT Goals(Current goals can be found in the care plan section) Acute Rehab OT Goals Patient Stated Goal: to drive OT Goal Formulation: With patient Time For Goal Achievement: 10/22/17 Potential to Achieve Goals: Good ADL Goals Pt Will Transfer to Toilet: with supervision;bedside commode;ambulating Additional ADL Goal #1: pt will complete LB adls (bathing and donning pants) with reacher at supervision level with no more than one safety cue  OT Frequency: Min 2X/week   Barriers to D/C:            Co-evaluation              AM-PAC PT "6 Clicks" Daily Activity      Outcome Measure Help from another person eating meals?: None Help from another person taking care of personal grooming?: A Little Help from another person toileting, which includes using toliet, bedpan, or urinal?: A Little Help from another person bathing (including washing, rinsing, drying)?: A Lot Help from another person to put on and taking off regular upper body clothing?: A Little Help from another person to put on and taking off regular lower body clothing?: A Lot 6 Click Score: 17   End of Session    Activity Tolerance: Patient tolerated treatment well Patient left: in chair;with call bell/phone within reach;with chair alarm set  OT Visit Diagnosis: Pain Pain - Right/Left: Left Pain - part of body: Knee                Time: 2876-8115 OT Time Calculation (min): 25 min Charges:  OT General Charges $OT Visit: 1 Visit OT Evaluation $OT Eval Low Complexity: 1 Low OT Treatments $Self Care/Home Management : 8-22 mins G-Codes:     [place], OTR/L [phone] 10/08/2017  Elex Mainwaring 10/08/2017, 9:25 AM

## 2017-10-11 ENCOUNTER — Encounter: Payer: Self-pay | Admitting: Adult Health

## 2017-10-11 ENCOUNTER — Non-Acute Institutional Stay (SKILLED_NURSING_FACILITY): Payer: PPO | Admitting: Adult Health

## 2017-10-11 DIAGNOSIS — N138 Other obstructive and reflux uropathy: Secondary | ICD-10-CM | POA: Diagnosis not present

## 2017-10-11 DIAGNOSIS — I10 Essential (primary) hypertension: Secondary | ICD-10-CM | POA: Diagnosis not present

## 2017-10-11 DIAGNOSIS — N401 Enlarged prostate with lower urinary tract symptoms: Secondary | ICD-10-CM | POA: Diagnosis not present

## 2017-10-11 DIAGNOSIS — M1712 Unilateral primary osteoarthritis, left knee: Secondary | ICD-10-CM | POA: Diagnosis not present

## 2017-10-11 DIAGNOSIS — J432 Centrilobular emphysema: Secondary | ICD-10-CM

## 2017-10-11 DIAGNOSIS — K219 Gastro-esophageal reflux disease without esophagitis: Secondary | ICD-10-CM

## 2017-10-11 DIAGNOSIS — E034 Atrophy of thyroid (acquired): Secondary | ICD-10-CM

## 2017-10-11 DIAGNOSIS — K5909 Other constipation: Secondary | ICD-10-CM | POA: Diagnosis not present

## 2017-10-11 NOTE — Progress Notes (Signed)
Location:   [facility] Room Number: [place] of Service:  SNF (31)   CODE STATUS: Full Code  Allergies  Allergen Reactions  . Ciprofloxacin Other (See Comments)  . Ciprocin-Fluocin-Procin [Fluocinolone Acetonide] Other (See Comments)    Blisters  . Fluocinolone Acetonide Other (See Comments)  . Imiquimod Other (See Comments)  . Quinolones Other (See Comments)    Pt had to go to hospital  . Statins Other (See Comments)    Legs hurt  . Imiquimod Other (See Comments)    Blisters  . Penicillins Itching, Rash and Other (See Comments)    Has patient had a PCN reaction causing immediate rash, facial/tongue/throat swelling, SOB or lightheadedness with hypotension: Yes Has patient had a PCN reaction causing severe rash involving mucus membranes or skin necrosis: No Has patient had a PCN reaction that required hospitalization: No Has patient had a PCN reaction occurring within the last 10 years: No If all of the above answers are "NO", then may proceed with Cephalosporin use.     Chief Complaint  Patient presents with  . Hospitalization Follow-up    Hospital Follow up    HPI:  He has been hospitalized for a left knee replacement performed on 10-06-17. He is here for short term rehab with his goal to return home. He denies any uncontrolled pain; no nausea; no constipation. There are no nursing concerns at this time. He will continue to be followed for his chronic illnesses including: copd; hypertension; gerd. There are no nursing concerns at this time.   Past Medical History:  Diagnosis Date  . Arthritis   . COPD (chronic obstructive pulmonary disease) ([place])   . Dyspnea    on exertion  . Hyperlipidemia   . Hypertension   . Pneumothorax, spontaneous, tension 1960  . SOB (shortness of breath) on exertion     Past Surgical History:  Procedure Laterality Date  . EYE SURGERY     bilateral cataract with lens implants  . LUNG SURGERY     40 years  ago  . TOE SURGERY    . TOTAL KNEE ARTHROPLASTY Left 10/06/2017   Procedure: LEFT TOTAL KNEE ARTHROPLASTY;  Surgeon: Susa Day, MD;  Location: [facility];  Service: Orthopedics;  Laterality: Left;  Adductor Block    Social History   Socioeconomic History  . Marital status: Divorced    Spouse name: Not on file  . Number of children: 0  . Years of education: Not on file  . Highest education level: Not on file  Occupational History  . Occupation: attorney  Social Needs  . Financial resource strain: Not on file  . Food insecurity:    Worry: Not on file    Inability: Not on file  . Transportation needs:    Medical: Not on file    Non-medical: Not on file  Tobacco Use  . Smoking status: Former Smoker    Packs/day: 1.00    Years: 5.00    Pack years: 5.00    Types: Cigarettes    Last attempt to quit: 11/24/1958    Years since quitting: 58.9  . Smokeless tobacco: Never Used  Substance and Sexual Activity  . Alcohol use: No  . Drug use: No  . Sexual activity: Not on file  Lifestyle  . Physical activity:    Days per week: Not on file    Minutes per session: Not on file  . Stress: Not on file  Relationships  . Social  connections:    Talks on phone: Not on file    Gets together: Not on file    Attends religious service: Not on file    Active member of club or organization: Not on file    Attends meetings of clubs or organizations: Not on file    Relationship status: Not on file  . Intimate partner violence:    Fear of current or ex partner: Not on file    Emotionally abused: Not on file    Physically abused: Not on file    Forced sexual activity: Not on file  Other Topics Concern  . Not on file  Social History Narrative  . Not on file   History reviewed. No pertinent family history.    VITAL SIGNS BP 120/68   Pulse 88   Temp 98.9 F (37.2 C)   Resp 19   Ht 6' (1.829 m)   Wt 178 lb (80.7 kg)   BMI 24.14 kg/m   Outpatient Encounter Medications as of 10/11/2017    Medication Sig  . albuterol (PROVENTIL HFA;VENTOLIN HFA) 108 (90 Base) MCG/ACT inhaler Inhale 1 puff into the lungs every 6 (six) hours as needed for wheezing or shortness of breath.   Marland Kitchen amLODipine (NORVASC) 5 MG tablet Take 1 tablet (5 mg total) by mouth daily.  . diphenhydrAMINE (BENADRYL) 25 MG tablet Take 2 tablets (50 mg total) by mouth every 4 (four) hours as needed (swelling and itching).  Marland Kitchen docusate sodium (COLACE) 100 MG capsule Take 1 capsule (100 mg total) by mouth 2 (two) times daily.  . Dutasteride-Tamsulosin HCl (JALYN) 0.5-0.4 MG CAPS Take 1 capsule by mouth daily.   . famotidine (PEPCID) 20 MG tablet Take 1 tablet (20 mg total) by mouth 2 (two) times daily.  . hydrochlorothiazide (,MICROZIDE/HYDRODIURIL,) 12.5 MG capsule TAKE ONE CAPSULE BY MOUTH EVERY DAY  . ibuprofen (ADVIL,MOTRIN) 800 MG tablet Take 1 tablet (800 mg total) by mouth 3 (three) times daily as needed for mild pain (not relieved by acetaminophen).  Marland Kitchen levothyroxine (SYNTHROID) 125 MCG tablet Take 125 mcg by mouth daily.   . magnesium oxide (MAG-OX) 400 MG tablet Take 400 mg by mouth daily.  Marland Kitchen omeprazole (PRILOSEC) 20 MG capsule Take 20 mg by mouth daily.  Marland Kitchen oxyCODONE (OXY IR/ROXICODONE) 5 MG immediate release tablet Take 1-2 tablets (5-10 mg total) by mouth every 4 (four) hours as needed for moderate pain or severe pain.  . polyethylene glycol (MIRALAX / GLYCOLAX) packet Take 17 g by mouth daily as needed for mild constipation.  . Tiotropium Bromide Monohydrate (SPIRIVA RESPIMAT) 2.5 MCG/ACT AERS Inhale 1 puff into the lungs daily.   No facility-administered encounter medications on file as of 10/11/2017.      SIGNIFICANT DIAGNOSTIC EXAMS  TODAY:   10-06-17: left knee x-ray: Total knee replacement with prosthetic components well-seated. No acute fracture or dislocation  LABS REVIEWED: TODAY:   10-08-17: wbc 10.0; hgb 11.5; hct 35.0; mcv 90.2; plt 135; glucose 123; bun 10; creat 0.82; k+ 4.3; na++ 141; ca 8.3     Review of Systems  Constitutional: Negative for malaise/fatigue.  Respiratory: Negative for cough and shortness of breath.   Cardiovascular: Negative for chest pain, palpitations and leg swelling.  Gastrointestinal: Negative for abdominal pain, constipation and heartburn.       Constipation has resolved   Musculoskeletal: Positive for joint pain. Negative for back pain and myalgias.       Left knee pain is managed   Skin: Negative.  Neurological: Negative for dizziness.  Psychiatric/Behavioral: The patient is not nervous/anxious.     Physical Exam  Constitutional: He is oriented to person, place, and time. He appears well-developed and well-nourished. No distress.  Thin   Neck: No thyromegaly present.  Cardiovascular: Normal rate, regular rhythm, normal heart sounds and intact distal pulses.  Pulmonary/Chest: Effort normal and breath sounds normal. No respiratory distress.  Abdominal: Soft. Bowel sounds are normal. He exhibits no distension. There is no tenderness.  Musculoskeletal: He exhibits no edema.  Is able to move all extremities  Is status post left knee replacement   Lymphadenopathy:    He has no cervical adenopathy.  Neurological: He is alert and oriented to person, place, and time.  Skin: Skin is warm and dry. He is not diaphoretic.  Incision line without signs of infection present   Psychiatric: He has a normal mood and affect.      ASSESSMENT/ PLAN:  TODAY:   1. Essential hypertension, benign: is stable b/p 120/68: will continue hctz 12.5 mg daily and norvasc 5 mg daily   2. Centrilobular emphysema: is stable will continue spiriva respimet: 2.5 mcg 1 puff daily; has albuterol 1 puff every 6 hours as needed  3. 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  Hypothyroidism due to acquired atrophy of thyroid: stable will continue synthroid 125 mcg daily   5. gerd without esophagitis: is stable will continue pepcid 20 mg twice daily and prilosec 20 mg daily   6. Chronic constipation: stable will continue colace twice daily and miralax daily as needed  7. BPH with obstruction/lower urinary tract symptoms: stable will continue jalyn 0.5/0.4 mg daily       MD is aware of resident's narcotic use and is in agreement with current plan of care. We will attempt to wean resident as apropriate   Ok Edwards NP [facility]  Contact [phone] Monday through Friday 8am- 5pm  After hours call [phone]

## 2017-10-12 ENCOUNTER — Encounter: Payer: Self-pay | Admitting: Internal Medicine

## 2017-10-12 ENCOUNTER — Non-Acute Institutional Stay (SKILLED_NURSING_FACILITY): Payer: PPO | Admitting: Internal Medicine

## 2017-10-12 DIAGNOSIS — J432 Centrilobular emphysema: Secondary | ICD-10-CM | POA: Diagnosis not present

## 2017-10-12 DIAGNOSIS — I10 Essential (primary) hypertension: Secondary | ICD-10-CM | POA: Diagnosis not present

## 2017-10-12 DIAGNOSIS — Z96652 Presence of left artificial knee joint: Secondary | ICD-10-CM | POA: Insufficient documentation

## 2017-10-12 NOTE — Progress Notes (Signed)
Provider:Bralyn Espino, Lyndel Safe   Location:   [facility] Room Number: 812/X Place of Service:  SNF (31)  PCP: Deland Pretty, MD Patient Care Team: Deland Pretty, MD as PCP - General (Internal Medicine)  Extended Emergency Contact Information Primary Emergency Contact: Ranae Plumber of [place] Phone: [phone] Relation: Sister  Code Status: Full Code Goals of Care: Advanced Directive information Advanced Directives 10/12/2017  Does Patient Have a Medical Advance Directive? Yes  Type of Advance Directive (No Data)  Does patient want to make changes to medical advance directive? No - Patient declined  Would patient like information on creating a medical advance directive? No - Patient declined      Chief Complaint  Patient presents with  . New Admit To SNF    New Admission Visit    HPI: Patient is a 83 y.o. male seen today for admission to SNF for therapy after undergoing Elective Left Total Knee Arthroplasty on 04/03. He stayed in the hospital from 04/03-04/05 Patient has a history of hypertension, hyperlipidemia and COPD and BPH. Patient was admitted electively for total knee arthroplasty for his arthritis left knee. Patient did well after the procedure.  His postop course was uneventful.  He was discharged to SNF for therapy. Patient is doing really well in therapy.  He is already walking with minimum assist.  His pain is controlled on Motrin.  He states he does not like oxycodone and it gives him hallucinations. He lives by himself and was very active and driving before surgery.   He denies any chest pain, shortness of breath, nausea or vomiting  Past Medical History:  Diagnosis Date  . Arthritis   . COPD (chronic obstructive pulmonary disease) ([place])   . Dyspnea    on exertion  . Hyperlipidemia   . Hypertension   . Pneumothorax, spontaneous, tension 1960  . SOB (shortness of breath) on exertion    Past  Surgical History:  Procedure Laterality Date  . EYE SURGERY     bilateral cataract with lens implants  . LUNG SURGERY     40 years ago  . TOE SURGERY    . TOTAL KNEE ARTHROPLASTY Left 10/06/2017   Procedure: LEFT TOTAL KNEE ARTHROPLASTY;  Surgeon: Susa Day, MD;  Location: [facility];  Service: Orthopedics;  Laterality: Left;  Adductor Block    reports that he quit smoking about 58 years ago. His smoking use included cigarettes. He has a 5.00 pack-year smoking history. He has never used smokeless tobacco. He reports that he does not drink alcohol or use drugs. Social History   Socioeconomic History  . Marital status: Divorced    Spouse name: Not on file  . Number of children: 0  . Years of education: Not on file  . Highest education level: Not on file  Occupational History  . Occupation: attorney  Social Needs  . Financial resource strain: Not on file  . Food insecurity:    Worry: Not on file    Inability: Not on file  . Transportation needs:    Medical: Not on file    Non-medical: Not on file  Tobacco Use  . Smoking status: Former Smoker    Packs/day: 1.00    Years: 5.00    Pack years: 5.00    Types: Cigarettes    Last attempt to quit: 11/24/1958    Years since quitting: 58.9  . Smokeless tobacco: Never Used  Substance and Sexual Activity  . Alcohol use: No  . Drug use: No  . Sexual activity: Not on file  Lifestyle  . Physical activity:    Days per week: Not on file    Minutes per session: Not on file  . Stress: Not on file  Relationships  . Social connections:    Talks on phone: Not on file    Gets together: Not on file    Attends religious service: Not on file    Active member of club or organization: Not on file    Attends meetings of clubs or organizations: Not on file    Relationship status: Not on file  . Intimate partner violence:    Fear of current or ex partner: Not on file    Emotionally abused: Not on file    Physically abused: Not on file     Forced sexual activity: Not on file  Other Topics Concern  . Not on file  Social History Narrative  . Not on file    Functional Status Survey:    History reviewed. No pertinent family history.  Health Maintenance  Topic Date Due  . INFLUENZA VACCINE  02/03/2018  . TETANUS/TDAP  Discontinued  . PNA vac Low Risk Adult  Discontinued    Allergies  Allergen Reactions  . Ciprofloxacin Other (See Comments)  . Ciprocin-Fluocin-Procin [Fluocinolone Acetonide] Other (See Comments)    Blisters  . Fluocinolone Acetonide Other (See Comments)  . Imiquimod Other (See Comments)  . Quinolones Other (See Comments)    Pt had to go to hospital  . Statins Other (See Comments)    Legs hurt  . Imiquimod Other (See Comments)    Blisters  . Penicillins Itching, Rash and Other (See Comments)    Has patient had a PCN reaction causing immediate rash, facial/tongue/throat swelling, SOB or lightheadedness with hypotension: Yes Has patient had a PCN reaction causing severe rash involving mucus membranes or skin necrosis: No Has patient had a PCN reaction that required hospitalization: No Has patient had a PCN reaction occurring within the last 10 years: No If all of the above answers are "NO", then may proceed with Cephalosporin use.     Allergies as of 10/12/2017      Reactions   Ciprofloxacin Other (See Comments)   Ciprocin-fluocin-procin [fluocinolone Acetonide] Other (See Comments)   Blisters   Fluocinolone Acetonide Other (See Comments)   Imiquimod Other (See Comments)   Quinolones Other (See Comments)   Pt had to go to hospital   Statins Other (See Comments)   Legs hurt   Imiquimod Other (See Comments)   Blisters   Penicillins Itching, Rash, Other (See Comments)   Has patient had a PCN reaction causing immediate rash, facial/tongue/throat swelling, SOB or lightheadedness with hypotension: Yes Has patient had a PCN reaction causing severe rash involving mucus membranes or skin necrosis:  No Has patient had a PCN reaction that required hospitalization: No Has patient had a PCN reaction occurring within the last 10 years: No If all of the above answers are "NO", then may proceed with Cephalosporin use.      Medication List        Accurate as of 10/12/17 12:25 PM. Always use your most recent med list.          albuterol 108 (90 Base) MCG/ACT inhaler Commonly known as:  PROVENTIL HFA;VENTOLIN HFA Inhale 1 puff into the lungs every 6 (six) hours as needed for wheezing or shortness of breath.  amLODipine 5 MG tablet Commonly known as:  NORVASC Take 1 tablet (5 mg total) by mouth daily.   diphenhydrAMINE 25 MG tablet Commonly known as:  BENADRYL Take 2 tablets (50 mg total) by mouth every 4 (four) hours as needed (swelling and itching).   docusate sodium 100 MG capsule Commonly known as:  COLACE Take 1 capsule (100 mg total) by mouth 2 (two) times daily.   famotidine 20 MG tablet Commonly known as:  PEPCID Take 1 tablet (20 mg total) by mouth 2 (two) times daily.   hydrochlorothiazide 12.5 MG capsule Commonly known as:  MICROZIDE TAKE ONE CAPSULE BY MOUTH EVERY DAY   ibuprofen 800 MG tablet Commonly known as:  ADVIL,MOTRIN Take 1 tablet (800 mg total) by mouth 3 (three) times daily as needed for mild pain (not relieved by acetaminophen).   JALYN 0.5-0.4 MG Caps Generic drug:  Dutasteride-Tamsulosin HCl Take 1 capsule by mouth daily.   magnesium oxide 400 MG tablet Commonly known as:  MAG-OX Take 400 mg by mouth daily.   omeprazole 20 MG capsule Commonly known as:  PRILOSEC Take 20 mg by mouth daily.   oxyCODONE 5 MG immediate release tablet Commonly known as:  Oxy IR/ROXICODONE Take 1-2 tablets (5-10 mg total) by mouth every 4 (four) hours as needed for moderate pain or severe pain.   polyethylene glycol packet Commonly known as:  MIRALAX / GLYCOLAX Take 17 g by mouth daily as needed for mild constipation.   SPIRIVA RESPIMAT 2.5 MCG/ACT  Aers Generic drug:  Tiotropium Bromide Monohydrate Inhale 1 puff into the lungs daily.   SYNTHROID 125 MCG tablet Generic drug:  levothyroxine Take 125 mcg by mouth daily.       Review of Systems  Review of Systems  Constitutional: Negative for activity change, appetite change, chills, diaphoresis, fatigue and fever.  HENT: Negative for mouth sores, postnasal drip, rhinorrhea, sinus pain and sore throat.   Respiratory: Negative for apnea, cough, chest tightness, shortness of breath and wheezing.   Cardiovascular: Negative for chest pain, palpitations and leg swelling.  Gastrointestinal: Negative for abdominal distention, abdominal pain, constipation, diarrhea, nausea and vomiting.  Genitourinary: Negative for dysuria and frequency.  Musculoskeletal: Negative for arthralgias, joint swelling and myalgias.  Skin: Negative for rash.  Neurological: Negative for dizziness, syncope, weakness, light-headedness and numbness.  Psychiatric/Behavioral: Negative for behavioral problems, confusion and sleep disturbance.     Vitals:   10/12/17 1212  BP: 122/69  Pulse: 75  Resp: (!) 22  Temp: (!) 97.2 F (36.2 C)  TempSrc: Oral  SpO2: 97%   There is no height or weight on file to calculate BMI. Physical Exam  Constitutional: He is oriented to person, place, and time. He appears well-developed and well-nourished.  HENT:  Head: Normocephalic.  Mouth/Throat: Oropharynx is clear and moist.  Eyes: Pupils are equal, round, and reactive to light.  Neck: Neck supple.  Cardiovascular: Normal rate and regular rhythm.  No murmur heard. Pulmonary/Chest: Effort normal and breath sounds normal. No stridor. No respiratory distress. He has no wheezes.  Abdominal: Soft. Bowel sounds are normal. He exhibits no distension. There is no tenderness. There is no guarding.  Musculoskeletal: He exhibits no edema.  Lymphadenopathy:    He has no cervical adenopathy.  Neurological: He is alert and oriented to  person, place, and time.  No Focal Deficits  Skin: Skin is warm and dry.  Psychiatric: He has a normal mood and affect. His behavior is normal. Thought content normal.  Labs reviewed: Basic Metabolic Panel: Recent Labs    10/04/17 1033 10/07/17 0538  NA 140 141  K 4.1 4.3  CL 104 107  CO2 29 29  GLUCOSE 97 123*  BUN 14 10  CREATININE 0.93 0.82  CALCIUM 9.6 8.4*   Liver Function Tests: No results for input(s): AST, ALT, ALKPHOS, BILITOT, PROT, ALBUMIN in the last 8760 hours. No results for input(s): LIPASE, AMYLASE in the last 8760 hours. No results for input(s): AMMONIA in the last 8760 hours. CBC: Recent Labs    10/04/17 1033 10/07/17 0538 10/08/17 0511  WBC 7.5 9.8 10.0  HGB 16.0 12.4* 11.5*  HCT 48.3 38.2* 35.0*  MCV 90.8 91.2 90.2  PLT 170 145* 135*   Cardiac Enzymes: No results for input(s): CKTOTAL, CKMB, CKMBINDEX, TROPONINI in the last 8760 hours. BNP: Invalid input(s): POCBNP No results found for: HGBA1C No results found for: TSH No results found for: VITAMINB12 No results found for: FOLATE No results found for: IRON, TIBC, FERRITIN  Imaging and Procedures obtained prior to SNF admission: No results found.  Assessment/Plan Essential hypertension Patient blood pressure controlled on amlodipine and Microzide Repeat BMP pending   COPD Patient stable continue on his inhalers  S/P total knee arthroplasty,  left pain controlled on Motrin Discontinue oxycodone as patient states it gives him hallucinations Repeat BMP and  CBC pending Restart her aspirin  follow-up with Ortho History of BPH Doing well on Jalyn  Hypothyroid Continue same dose of Synthroid Follows with his PCP for management Constipation Continue on Miralax  Disposition Patient will be discharged  home .   Family/ staff Communication:   Labs/tests ordered:  Total time spent in this patient care encounter was 45_ minutes; greater than 50% of the visit spent counseling  patient, reviewing records , Labs and coordinating care for problems addressed at this encounter.

## 2017-10-13 DIAGNOSIS — I10 Essential (primary) hypertension: Secondary | ICD-10-CM | POA: Insufficient documentation

## 2017-10-13 DIAGNOSIS — K5909 Other constipation: Secondary | ICD-10-CM | POA: Insufficient documentation

## 2017-10-13 DIAGNOSIS — E034 Atrophy of thyroid (acquired): Secondary | ICD-10-CM | POA: Insufficient documentation

## 2017-10-13 DIAGNOSIS — K219 Gastro-esophageal reflux disease without esophagitis: Secondary | ICD-10-CM | POA: Insufficient documentation

## 2017-10-13 DIAGNOSIS — N401 Enlarged prostate with lower urinary tract symptoms: Secondary | ICD-10-CM

## 2017-10-13 DIAGNOSIS — N138 Other obstructive and reflux uropathy: Secondary | ICD-10-CM | POA: Insufficient documentation

## 2017-10-14 ENCOUNTER — Non-Acute Institutional Stay (SKILLED_NURSING_FACILITY): Payer: PPO | Admitting: Internal Medicine

## 2017-10-14 ENCOUNTER — Other Ambulatory Visit: Payer: Self-pay

## 2017-10-14 ENCOUNTER — Encounter: Payer: Self-pay | Admitting: Internal Medicine

## 2017-10-14 DIAGNOSIS — K219 Gastro-esophageal reflux disease without esophagitis: Secondary | ICD-10-CM

## 2017-10-14 DIAGNOSIS — Z96652 Presence of left artificial knee joint: Secondary | ICD-10-CM | POA: Diagnosis not present

## 2017-10-14 DIAGNOSIS — N138 Other obstructive and reflux uropathy: Secondary | ICD-10-CM | POA: Diagnosis not present

## 2017-10-14 DIAGNOSIS — I10 Essential (primary) hypertension: Secondary | ICD-10-CM

## 2017-10-14 DIAGNOSIS — J432 Centrilobular emphysema: Secondary | ICD-10-CM

## 2017-10-14 DIAGNOSIS — N401 Enlarged prostate with lower urinary tract symptoms: Secondary | ICD-10-CM

## 2017-10-14 NOTE — Progress Notes (Signed)
Location:    [facility] Room Number: 734/L Place of Service:  SNF [phone])  Provider: Veleta Miners  PCP: Deland Pretty, MD Patient Care Team: Deland Pretty, MD as PCP - General (Internal Medicine)  Extended Emergency Contact Information Primary Emergency Contact: Ranae Plumber of [place] Phone: [phone] Relation: Sister  Code Status: Full Code Goals of care:  Advanced Directive information Advanced Directives 10/14/2017  Does Patient Have a Medical Advance Directive? Yes  Type of Advance Directive (No Data)  Does patient want to make changes to medical advance directive? No - Patient declined  Would patient like information on creating a medical advance directive? No - Patient declined     Allergies  Allergen Reactions  . Ciprofloxacin Other (See Comments)  . Ciprocin-Fluocin-Procin [Fluocinolone Acetonide] Other (See Comments)    Blisters  . Fluocinolone Acetonide Other (See Comments)  . Imiquimod Other (See Comments)  . Quinolones Other (See Comments)    Pt had to go to hospital  . Statins Other (See Comments)    Legs hurt  . Imiquimod Other (See Comments)    Blisters  . Penicillins Itching, Rash and Other (See Comments)    Has patient had a PCN reaction causing immediate rash, facial/tongue/throat swelling, SOB or lightheadedness with hypotension: Yes Has patient had a PCN reaction causing severe rash involving mucus membranes or skin necrosis: No Has patient had a PCN reaction that required hospitalization: No Has patient had a PCN reaction occurring within the last 10 years: No If all of the above answers are "NO", then may proceed with Cephalosporin use.     Chief Complaint  Patient presents with  . Discharge Note    Discharge Visit    HPI:  83 y.o. male  Seen Today for Discharge from the Facility. Patient was admitted to SNF  after undergoing Elective Left Total Knee Arthroplasty on  04/03. He stayed in the hospital from 04/03-04/05 Patient has a history of hypertension, hyperlipidemia and COPD and BPH. Patient was admitted electively for total knee arthroplasty for his arthritis left knee. Patient did well after the procedure.  His postop course was uneventful.  He was discharged to SNF for therapy.  He did well with therapy. Walking with no assist. His pain is controlled with Motrin.  He is planning to go home with Home health and Therapy. He lives by himself but has family and friends in community to help. Has follow up with ortho in 1 week.  Past Medical History:  Diagnosis Date  . Arthritis   . COPD (chronic obstructive pulmonary disease) ([place])   . Dyspnea    on exertion  . Hyperlipidemia   . Hypertension   . Pneumothorax, spontaneous, tension 1960  . SOB (shortness of breath) on exertion     Past Surgical History:  Procedure Laterality Date  . EYE SURGERY     bilateral cataract with lens implants  . LUNG SURGERY     40 years ago  . TOE SURGERY    . TOTAL KNEE ARTHROPLASTY Left 10/06/2017   Procedure: LEFT TOTAL KNEE ARTHROPLASTY;  Surgeon: Susa Day, MD;  Location: [facility];  Service: Orthopedics;  Laterality: Left;  Adductor Block      reports that he quit smoking about 58 years ago. His smoking use included cigarettes. He has a 5.00 pack-year smoking history. He has never used smokeless tobacco. He reports that he does not drink  alcohol or use drugs. Social History   Socioeconomic History  . Marital status: Divorced    Spouse name: Not on file  . Number of children: 0  . Years of education: Not on file  . Highest education level: Not on file  Occupational History  . Occupation: attorney  Social Needs  . Financial resource strain: Not on file  . Food insecurity:    Worry: Not on file    Inability: Not on file  . Transportation needs:    Medical: Not on file    Non-medical: Not on file  Tobacco Use  . Smoking status: Former Smoker     Packs/day: 1.00    Years: 5.00    Pack years: 5.00    Types: Cigarettes    Last attempt to quit: 11/24/1958    Years since quitting: 58.9  . Smokeless tobacco: Never Used  Substance and Sexual Activity  . Alcohol use: No  . Drug use: No  . Sexual activity: Not on file  Lifestyle  . Physical activity:    Days per week: Not on file    Minutes per session: Not on file  . Stress: Not on file  Relationships  . Social connections:    Talks on phone: Not on file    Gets together: Not on file    Attends religious service: Not on file    Active member of club or organization: Not on file    Attends meetings of clubs or organizations: Not on file    Relationship status: Not on file  . Intimate partner violence:    Fear of current or ex partner: Not on file    Emotionally abused: Not on file    Physically abused: Not on file    Forced sexual activity: Not on file  Other Topics Concern  . Not on file  Social History Narrative  . Not on file   Functional Status Survey:    Allergies  Allergen Reactions  . Ciprofloxacin Other (See Comments)  . Ciprocin-Fluocin-Procin [Fluocinolone Acetonide] Other (See Comments)    Blisters  . Fluocinolone Acetonide Other (See Comments)  . Imiquimod Other (See Comments)  . Quinolones Other (See Comments)    Pt had to go to hospital  . Statins Other (See Comments)    Legs hurt  . Imiquimod Other (See Comments)    Blisters  . Penicillins Itching, Rash and Other (See Comments)    Has patient had a PCN reaction causing immediate rash, facial/tongue/throat swelling, SOB or lightheadedness with hypotension: Yes Has patient had a PCN reaction causing severe rash involving mucus membranes or skin necrosis: No Has patient had a PCN reaction that required hospitalization: No Has patient had a PCN reaction occurring within the last 10 years: No If all of the above answers are "NO", then may proceed with Cephalosporin use.     Pertinent  Health  Maintenance Due  Topic Date Due  . INFLUENZA VACCINE  02/03/2018  . PNA vac Low Risk Adult  Discontinued    Medications: Allergies as of 10/14/2017      Reactions   Ciprofloxacin Other (See Comments)   Ciprocin-fluocin-procin [fluocinolone Acetonide] Other (See Comments)   Blisters   Fluocinolone Acetonide Other (See Comments)   Imiquimod Other (See Comments)   Quinolones Other (See Comments)   Pt had to go to hospital   Statins Other (See Comments)   Legs hurt   Imiquimod Other (See Comments)   Blisters   Penicillins Itching, Rash, Other (  See Comments)   Has patient had a PCN reaction causing immediate rash, facial/tongue/throat swelling, SOB or lightheadedness with hypotension: Yes Has patient had a PCN reaction causing severe rash involving mucus membranes or skin necrosis: No Has patient had a PCN reaction that required hospitalization: No Has patient had a PCN reaction occurring within the last 10 years: No If all of the above answers are "NO", then may proceed with Cephalosporin use.      Medication List        Accurate as of 10/14/17  9:08 AM. Always use your most recent med list.          albuterol 108 (90 Base) MCG/ACT inhaler Commonly known as:  PROVENTIL HFA;VENTOLIN HFA Inhale 1 puff into the lungs every 6 (six) hours as needed for wheezing or shortness of breath.   amLODipine 5 MG tablet Commonly known as:  NORVASC Take 1 tablet (5 mg total) by mouth daily.   aspirin 81 MG chewable tablet Chew 81 mg by mouth daily.   diphenhydrAMINE 25 MG tablet Commonly known as:  BENADRYL Take 2 tablets (50 mg total) by mouth every 4 (four) hours as needed (swelling and itching).   docusate sodium 100 MG capsule Commonly known as:  COLACE Take 1 capsule (100 mg total) by mouth 2 (two) times daily.   hydrochlorothiazide 12.5 MG capsule Commonly known as:  MICROZIDE TAKE ONE CAPSULE BY MOUTH EVERY DAY   ibuprofen 800 MG tablet Commonly known as:   ADVIL,MOTRIN Take 1 tablet (800 mg total) by mouth 3 (three) times daily as needed for mild pain (not relieved by acetaminophen).   JALYN 0.5-0.4 MG Caps Generic drug:  Dutasteride-Tamsulosin HCl Take 1 capsule by mouth daily.   magnesium oxide 400 MG tablet Commonly known as:  MAG-OX Take 400 mg by mouth daily.   omeprazole 20 MG capsule Commonly known as:  PRILOSEC Take 20 mg by mouth daily.   polyethylene glycol packet Commonly known as:  MIRALAX / GLYCOLAX Take 17 g by mouth daily as needed for mild constipation.   SPIRIVA RESPIMAT 2.5 MCG/ACT Aers Generic drug:  Tiotropium Bromide Monohydrate Inhale 1 puff into the lungs daily.   SYNTHROID 125 MCG tablet Generic drug:  levothyroxine Take 125 mcg by mouth daily.       Review of Systems  Vitals:   10/14/17 0856  BP: 119/62  Pulse: 61  Resp: 18  Temp: (!) 97.2 F (36.2 C)  TempSrc: Oral   There is no height or weight on file to calculate BMI. Physical Exam  Constitutional: He is oriented to person, place, and time. He appears well-developed and well-nourished.  HENT:  Head: Normocephalic.  Mouth/Throat: Oropharynx is clear and moist.  Eyes: Pupils are equal, round, and reactive to light.  Neck: Normal range of motion. Neck supple.  Cardiovascular: Normal rate and regular rhythm.  Pulmonary/Chest: Effort normal and breath sounds normal.  Abdominal: Soft. Bowel sounds are normal. He exhibits no distension. There is no tenderness.  Lymphadenopathy:    He has no cervical adenopathy.  Neurological: He is alert and oriented to person, place, and time.  Skin: Skin is warm and dry.  Psychiatric: He has a normal mood and affect. His behavior is normal. Thought content normal.    Labs reviewed: Basic Metabolic Panel: Recent Labs    10/04/17 1033 10/07/17 0538  NA 140 141  K 4.1 4.3  CL 104 107  CO2 29 29  GLUCOSE 97 123*  BUN 14 10  CREATININE 0.93 0.82  CALCIUM 9.6 8.4*   Liver Function Tests: No  results for input(s): AST, ALT, ALKPHOS, BILITOT, PROT, ALBUMIN in the last 8760 hours. No results for input(s): LIPASE, AMYLASE in the last 8760 hours. No results for input(s): AMMONIA in the last 8760 hours. CBC: Recent Labs    10/04/17 1033 10/07/17 0538 10/08/17 0511  WBC 7.5 9.8 10.0  HGB 16.0 12.4* 11.5*  HCT 48.3 38.2* 35.0*  MCV 90.8 91.2 90.2  PLT 170 145* 135*   Cardiac Enzymes: No results for input(s): CKTOTAL, CKMB, CKMBINDEX, TROPONINI in the last 8760 hours. BNP: Invalid input(s): POCBNP CBG: No results for input(s): GLUCAP in the last 8760 hours.  Procedures and Imaging Studies During Stay: Dg Knee Left Port  Result Date: 10/06/2017 CLINICAL DATA:  Status post total knee replacement EXAM: PORTABLE LEFT KNEE - 1-2 VIEW COMPARISON:  None. FINDINGS: Frontal and lateral views were obtained. There is a total knee replacement with femoral and tibial prosthetic components well-seated. No acute fracture or dislocation. No erosive change. Soft tissue air within the knee joint is an expected postoperative finding. IMPRESSION: Total knee replacement with prosthetic components well-seated. No acute fracture or dislocation. Electronically Signed   By: Lowella Grip III M.D.   On: 10/06/2017 11:52    Assessment/Plan:   Essential hypertension Patient blood pressure controlled on amlodipine and Microzide Repeat BMP in Facility showed stable Renal Function With BUN of 14 and Creat of .75  COPD Patient stable continue on his inhalers S/P total knee arthroplasty,  left pain controlled on Motrin Discontinued oxycodone as patient states it gives him hallucinations  follow-up with Ortho Restarted on Aspirin Repeat CBC had hgb of 11.1 Told patient to be Careful while walking and going outside. He has appointment with Ortho in 1 week. History of BPH Doing well on Jalyn  Hypothyroid Continue same dose of Synthroid Follows with his PCP for management Constipation Continue  on Miralax  Disposition Patient will be discharged  home .With Home therapy and Home health.    Future labs/tests needed:    Discharge less then 30 min. Follow up with Ortho and his PCP. Continue to take precautions till he sees his Ortho.

## 2017-10-14 NOTE — Patient Outreach (Signed)
[facility]) Care Management  10/14/2017  Joseph [name] Feb 16, 1935 [id number]   TELEPHONE SCREENING Referral date:10/14/17 Referral source: [facility] utilization management Referral reason: discharged from hospital on 10/08/17 Insurance: Health team advantage   Received return call from patient. HIPAA verified. Explained reason for call. Patient states he was discharged today from the nursing facility. States he has called [facility] to check on home therapy status. Patient states [facility] would be contacting him regarding his therapy. Patient denies any current pain.  States he is doing ok. Patient states he does live alone. States he is able to get around in his home with walker and he is able to get his food. Patient reports he has a follow up visit scheduled with his surgeon and will be scheduling a follow up with his primary MD.  [facility] reviewed signs of infection with patient. Denies any signs/ symptoms of infection. Patient reports his pain level is 0 at this time.  Patient reports he has transportation assistance to his doctor appointments.  Patient reports he has his medications and is taking them as prescribed.  RNCM discussed and offered [facility] care management services. Patient refused services at this time. Patient verbally agreed to received [facility] care management brochure.   RNCM advised patient to notify MD of any changes in condition prior to scheduled appointment. Patient requested [facility] text contact name and number: [phone] or main office number [phone] and 24 hour nurse advise line [phone].  RNCM verified patient aware of 911 services for urgent/ emergent needs.   PLAN; RNCM will close patient due to refusal of services.  RNCM wil send patient [facility] brochure as discussed.   Quinn Plowman RN,BSN,CCM [facility] Telephonic  [phone]

## 2017-10-14 NOTE — Patient Outreach (Signed)
[facility]) Care Management  10/14/2017  Joseph [name] 03/04/1935 [id number]   TELEPHONE SCREENING Referral date:10/14/17 [facility] Referral reason: discharged from hospital on 10/08/17 Insurance: Health team advantage  Transition of care will be completed by primary care provider office who will refer to [facility] care management if needed. Patient is enrolled in external program for transition of care.   Telephone call to patient. Unable to reach. HIPAA compliant voice message left with call back phone number.   PLAN: RNCM will attempt 2nd telephone outreach to patient within 4 business days.   Quinn Plowman RN,BSN,CCM [facility] Telephonic  [phone]

## 2017-10-15 DIAGNOSIS — Z7982 Long term (current) use of aspirin: Secondary | ICD-10-CM | POA: Diagnosis not present

## 2017-10-15 DIAGNOSIS — Z87891 Personal history of nicotine dependence: Secondary | ICD-10-CM | POA: Diagnosis not present

## 2017-10-15 DIAGNOSIS — N400 Enlarged prostate without lower urinary tract symptoms: Secondary | ICD-10-CM | POA: Diagnosis not present

## 2017-10-15 DIAGNOSIS — Z9181 History of falling: Secondary | ICD-10-CM | POA: Diagnosis not present

## 2017-10-15 DIAGNOSIS — J449 Chronic obstructive pulmonary disease, unspecified: Secondary | ICD-10-CM | POA: Diagnosis not present

## 2017-10-15 DIAGNOSIS — Z96652 Presence of left artificial knee joint: Secondary | ICD-10-CM | POA: Diagnosis not present

## 2017-10-15 DIAGNOSIS — I10 Essential (primary) hypertension: Secondary | ICD-10-CM | POA: Diagnosis not present

## 2017-10-15 DIAGNOSIS — Z471 Aftercare following joint replacement surgery: Secondary | ICD-10-CM | POA: Diagnosis not present

## 2017-10-18 DIAGNOSIS — Z471 Aftercare following joint replacement surgery: Secondary | ICD-10-CM | POA: Diagnosis not present

## 2017-10-18 DIAGNOSIS — Z87891 Personal history of nicotine dependence: Secondary | ICD-10-CM | POA: Diagnosis not present

## 2017-10-18 DIAGNOSIS — Z7982 Long term (current) use of aspirin: Secondary | ICD-10-CM | POA: Diagnosis not present

## 2017-10-18 DIAGNOSIS — I10 Essential (primary) hypertension: Secondary | ICD-10-CM | POA: Diagnosis not present

## 2017-10-18 DIAGNOSIS — J449 Chronic obstructive pulmonary disease, unspecified: Secondary | ICD-10-CM | POA: Diagnosis not present

## 2017-10-18 DIAGNOSIS — N400 Enlarged prostate without lower urinary tract symptoms: Secondary | ICD-10-CM | POA: Diagnosis not present

## 2017-10-18 DIAGNOSIS — Z96652 Presence of left artificial knee joint: Secondary | ICD-10-CM | POA: Diagnosis not present

## 2017-10-18 DIAGNOSIS — Z9181 History of falling: Secondary | ICD-10-CM | POA: Diagnosis not present

## 2017-10-19 NOTE — Telephone Encounter (Signed)
This encounter was created in error - please disregard.

## 2017-10-20 DIAGNOSIS — Z4889 Encounter for other specified surgical aftercare: Secondary | ICD-10-CM | POA: Diagnosis not present

## 2017-10-20 DIAGNOSIS — Z4789 Encounter for other orthopedic aftercare: Secondary | ICD-10-CM | POA: Diagnosis not present

## 2017-10-20 DIAGNOSIS — M1712 Unilateral primary osteoarthritis, left knee: Secondary | ICD-10-CM | POA: Diagnosis not present

## 2017-10-21 DIAGNOSIS — Z96652 Presence of left artificial knee joint: Secondary | ICD-10-CM | POA: Diagnosis not present

## 2017-10-21 DIAGNOSIS — Z87891 Personal history of nicotine dependence: Secondary | ICD-10-CM | POA: Diagnosis not present

## 2017-10-21 DIAGNOSIS — Z471 Aftercare following joint replacement surgery: Secondary | ICD-10-CM | POA: Diagnosis not present

## 2017-10-21 DIAGNOSIS — N400 Enlarged prostate without lower urinary tract symptoms: Secondary | ICD-10-CM | POA: Diagnosis not present

## 2017-10-21 DIAGNOSIS — J449 Chronic obstructive pulmonary disease, unspecified: Secondary | ICD-10-CM | POA: Diagnosis not present

## 2017-10-21 DIAGNOSIS — Z7982 Long term (current) use of aspirin: Secondary | ICD-10-CM | POA: Diagnosis not present

## 2017-10-21 DIAGNOSIS — I10 Essential (primary) hypertension: Secondary | ICD-10-CM | POA: Diagnosis not present

## 2017-10-21 DIAGNOSIS — Z9181 History of falling: Secondary | ICD-10-CM | POA: Diagnosis not present

## 2017-10-26 DIAGNOSIS — M25662 Stiffness of left knee, not elsewhere classified: Secondary | ICD-10-CM | POA: Insufficient documentation

## 2017-10-28 DIAGNOSIS — R748 Abnormal levels of other serum enzymes: Secondary | ICD-10-CM | POA: Diagnosis not present

## 2017-11-04 DIAGNOSIS — M25662 Stiffness of left knee, not elsewhere classified: Secondary | ICD-10-CM | POA: Diagnosis not present

## 2017-11-17 DIAGNOSIS — Z96652 Presence of left artificial knee joint: Secondary | ICD-10-CM | POA: Diagnosis not present

## 2017-11-17 DIAGNOSIS — Z4789 Encounter for other orthopedic aftercare: Secondary | ICD-10-CM | POA: Diagnosis not present

## 2017-11-17 DIAGNOSIS — Z471 Aftercare following joint replacement surgery: Secondary | ICD-10-CM | POA: Diagnosis not present

## 2017-11-26 ENCOUNTER — Encounter (HOSPITAL_COMMUNITY): Payer: Self-pay | Admitting: Emergency Medicine

## 2017-11-26 ENCOUNTER — Emergency Department (HOSPITAL_COMMUNITY)
Admission: EM | Admit: 2017-11-26 | Discharge: 2017-11-26 | Disposition: A | Discharge status: Home / Self Care | Payer: PPO | Attending: Emergency Medicine | Admitting: Emergency Medicine

## 2017-11-26 ENCOUNTER — Emergency Department (HOSPITAL_COMMUNITY): Payer: PPO

## 2017-11-26 DIAGNOSIS — J449 Chronic obstructive pulmonary disease, unspecified: Secondary | ICD-10-CM | POA: Diagnosis not present

## 2017-11-26 DIAGNOSIS — Z189 Retained foreign body fragments, unspecified material: Secondary | ICD-10-CM | POA: Insufficient documentation

## 2017-11-26 DIAGNOSIS — I10 Essential (primary) hypertension: Secondary | ICD-10-CM | POA: Diagnosis not present

## 2017-11-26 DIAGNOSIS — Z23 Encounter for immunization: Secondary | ICD-10-CM | POA: Insufficient documentation

## 2017-11-26 DIAGNOSIS — Z87891 Personal history of nicotine dependence: Secondary | ICD-10-CM | POA: Diagnosis not present

## 2017-11-26 DIAGNOSIS — M25476 Effusion, unspecified foot: Secondary | ICD-10-CM | POA: Diagnosis not present

## 2017-11-26 DIAGNOSIS — S90859A Superficial foreign body, unspecified foot, initial encounter: Secondary | ICD-10-CM

## 2017-11-26 DIAGNOSIS — Z79899 Other long term (current) drug therapy: Secondary | ICD-10-CM | POA: Insufficient documentation

## 2017-11-26 DIAGNOSIS — S90852A Superficial foreign body, left foot, initial encounter: Secondary | ICD-10-CM | POA: Diagnosis not present

## 2017-11-26 DIAGNOSIS — M60272 Foreign body granuloma of soft tissue, not elsewhere classified, left ankle and foot: Secondary | ICD-10-CM | POA: Insufficient documentation

## 2017-11-26 DIAGNOSIS — E039 Hypothyroidism, unspecified: Secondary | ICD-10-CM | POA: Insufficient documentation

## 2017-11-26 DIAGNOSIS — Z7982 Long term (current) use of aspirin: Secondary | ICD-10-CM | POA: Diagnosis not present

## 2017-11-26 MED ORDER — BACITRACIN ZINC 500 UNIT/GM EX OINT
TOPICAL_OINTMENT | Freq: Two times a day (BID) | CUTANEOUS | Status: DC
  Administered 2017-11-26: 1 via TOPICAL
  Filled 2017-11-26: qty 0.9

## 2017-11-26 MED ORDER — TETANUS-DIPHTH-ACELL PERTUSSIS 5-2.5-18.5 LF-MCG/0.5 IM SUSP
0.5000 mL | Freq: Once | INTRAMUSCULAR | Status: AC
  Administered 2017-11-26: 0.5 mL via INTRAMUSCULAR
  Filled 2017-11-26: qty 0.5

## 2017-11-26 MED ORDER — DOXYCYCLINE HYCLATE 100 MG PO CAPS: 100.0000 mg | ORAL_CAPSULE | Freq: Two times a day (BID) | ORAL | 0 refills | Status: DC

## 2017-11-26 MED ORDER — DOXYCYCLINE HYCLATE 100 MG PO TABS
100.0000 mg | ORAL_TABLET | Freq: Once | ORAL | Status: AC
  Administered 2017-11-26: 100 mg via ORAL
  Filled 2017-11-26: qty 1

## 2017-11-26 NOTE — ED Provider Notes (Signed)
[facility] EMERGENCY DEPARTMENT Provider Note   CSN: [id number] Arrival date & time: 11/26/17  0018     History   Chief Complaint Chief Complaint  Patient presents with  . Foot Pain    HPI Joseph [name] is a 83 y.o. male.  [name] history is provided by [name] patient.  Foot Pain  This is a new problem. [name] current episode started 3 to 5 hours ago. [name] problem occurs constantly. [name] problem has not changed since onset.Pertinent negatives include no chest pain, no abdominal pain, no headaches and no shortness of breath. Nothing aggravates [name] symptoms. Nothing relieves [name] symptoms. He has tried nothing for [name] symptoms. [name] treatment provided no relief.  Stepped on something several hours ago and noted bleeding on sole of lateral midfoot and came in for evaluation.    Past Medical History:  Diagnosis Date  . Arthritis   . COPD (chronic obstructive pulmonary disease) ([place])   . Dyspnea    on exertion  . Hyperlipidemia   . Hypertension   . Pneumothorax, spontaneous, tension 1960  . SOB (shortness of breath) on exertion     Patient Active Problem List   Diagnosis Date Noted  . Essential hypertension, benign 10/13/2017  . Hypothyroidism due to acquired atrophy of thyroid 10/13/2017  . GERD without esophagitis 10/13/2017  . BPH with obstruction/lower urinary tract symptoms 10/13/2017  . Chronic constipation 10/13/2017  . S/P total knee arthroplasty, left 10/12/2017  . Left knee DJD 10/06/2017  . Lung nodule 01/09/2016  . COPD (chronic obstructive pulmonary disease) ([place]) 01/09/2016  . Hyperlipidemia   . Pneumothorax, spontaneous, tension   . SOB (shortness of breath) on exertion     Past Surgical History:  Procedure Laterality Date  . EYE SURGERY     bilateral cataract with lens implants  . LUNG SURGERY     40 years ago  . TOE SURGERY    . TOTAL KNEE ARTHROPLASTY Left 10/06/2017   Procedure: LEFT TOTAL KNEE ARTHROPLASTY;  Surgeon: Susa Day, MD;   Location: [facility];  Service: Orthopedics;  Laterality: Left;  Adductor Block        Home Medications    Prior to Admission medications   Medication Sig Start Date End Date Taking? Authorizing Provider  albuterol (PROVENTIL HFA;VENTOLIN HFA) 108 (90 Base) MCG/ACT inhaler Inhale 1 puff into [name] lungs every 6 (six) hours as needed for wheezing or shortness of breath.     [provider]  amLODipine (NORVASC) 5 MG tablet Take 1 tablet (5 mg total) by mouth daily. 10/23/11   [place], Peter M, MD  aspirin 81 MG chewable tablet Chew 81 mg by mouth daily.    [provider]  diphenhydrAMINE (BENADRYL) 25 MG tablet Take 2 tablets (50 mg total) by mouth every 4 (four) hours as needed (swelling and itching). 04/01/13   Pisciotta, Elmyra Ricks, PA-C  docusate sodium (COLACE) 100 MG capsule Take 1 capsule (100 mg total) by mouth 2 (two) times daily. 10/08/17   Cecilie Kicks, PA-C  Dutasteride-Tamsulosin HCl (JALYN) 0.5-0.4 MG CAPS Take 1 capsule by mouth daily.     [provider]  hydrochlorothiazide (,MICROZIDE/HYDRODIURIL,) 12.5 MG capsule TAKE ONE CAPSULE BY MOUTH EVERY DAY 03/09/11   [place], Peter M, MD  ibuprofen (ADVIL,MOTRIN) 800 MG tablet Take 1 tablet (800 mg total) by mouth 3 (three) times daily as needed for mild pain (not relieved by acetaminophen). 10/08/17   Cecilie Kicks, PA-C  levothyroxine (SYNTHROID) 125 MCG tablet  Take 125 mcg by mouth daily.     [provider]  magnesium oxide (MAG-OX) 400 MG tablet Take 400 mg by mouth daily.    [provider]  omeprazole (PRILOSEC) 20 MG capsule Take 20 mg by mouth daily.    [provider]  polyethylene glycol (MIRALAX / GLYCOLAX) packet Take 17 g by mouth daily as needed for mild constipation. 10/08/17   Cecilie Kicks, PA-C  Tiotropium Bromide Monohydrate (SPIRIVA RESPIMAT) 2.5 MCG/ACT AERS Inhale 1 puff into [name] lungs daily.    [provider]    Family History No family history on  file.  Social History Social History   Tobacco Use  . Smoking status: Former Smoker    Packs/day: 1.00    Years: 5.00    Pack years: 5.00    Types: Cigarettes    Last attempt to quit: 11/24/1958    Years since quitting: 59.0  . Smokeless tobacco: Never Used  Substance Use Topics  . Alcohol use: No  . Drug use: No     Allergies   Ciprofloxacin; Ciprocin-fluocin-procin [fluocinolone acetonide]; Fluocinolone acetonide; Imiquimod; Quinolones; Statins; Imiquimod; and Penicillins   Review of Systems Review of Systems  Constitutional: Negative for fever.  Respiratory: Negative for shortness of breath.   Cardiovascular: Negative for chest pain.  Gastrointestinal: Negative for abdominal pain.  Genitourinary: Negative for flank pain.  Musculoskeletal: Negative for gait problem.  Neurological: Negative for weakness, numbness and headaches.  All other systems reviewed and are negative.    Physical Exam Updated Vital Signs BP (!) 145/74 (BP Location: Right Arm)   Pulse 73   Temp 98 F (36.7 C) (Oral)   Resp 16   SpO2 96%   Physical Exam  Constitutional: He is oriented to person, place, and time. He appears well-developed and well-nourished. No distress.  HENT:  Head: Normocephalic and atraumatic.  Mouth/Throat: No oropharyngeal exudate.  Eyes: Pupils are equal, round, and reactive to light. Conjunctivae are normal.  Neck: Normal range of motion. Neck supple.  Cardiovascular: Normal rate, regular rhythm, normal heart sounds and intact distal pulses.  Pulmonary/Chest: Effort normal and breath sounds normal. No stridor. He has no wheezes. He has no rales.  Abdominal: Soft. Bowel sounds are normal. He exhibits no mass. There is no tenderness. There is no rebound and no guarding.  Musculoskeletal: Normal range of motion.       Feet:  Neurological: He is alert and oriented to person, place, and time. He displays normal reflexes.  Skin: Skin is warm and dry. Capillary refill  takes less than 2 seconds.     ED Treatments / Results  Labs (all labs ordered are listed, but only abnormal results are displayed) Labs Reviewed - No data to display  EKG None  Radiology Dg Foot Complete Left  Result Date: 11/26/2017 CLINICAL DATA:  Stepped on something with foreign body sensation laterally. EXAM: LEFT FOOT - COMPLETE 3+ VIEW COMPARISON:  None. FINDINGS: Small linear 2 x 3 mm density in [name] plantar lateral foot at [name] level of [name] calcaneus may be a soft tissue calcification or foreign body. No other radiopaque foreign body. No soft tissue air. No fracture. Prior first metatarsal phalangeal joint fusion with intact hardware. Bony fusion of [name] second toe proximal interphalangeal joint without hardware. IMPRESSION: Small linear 2 x 3 mm density in [name] plantar lateral foot at [name] level of [name] calcaneus may be a small foreign body or incidental soft tissue calcification. Recommend correlation with site  of foreign body sensation. No other radiopaque foreign body. No acute osseous abnormalities. Electronically Signed   By: Jeb Levering M.D.   On: 11/26/2017 02:05    Procedures Procedures (including critical care time)  Medications Ordered in ED Medications  bacitracin ointment (has no administration in time range)  Tdap (BOOSTRIX) injection 0.5 mL (has no administration in time range)  doxycycline (VIBRA-TABS) tablet 100 mg (has no administration in time range)       Final Clinical Impressions(s) / ED Diagnoses   Will start antibiotics and have patient follow up with foot surgeon.  Finding on XR is deep and not near wound.  No visualized foreign body at site of wound.  Wound care performed.    Return for weakness, numbness, changes in vision or speech, fevers >100.4 unrelieved by medication, shortness of breath, intractable vomiting, or diarrhea, abdominal pain, Inability to tolerate liquids or food, cough, altered mental status or any concerns. No signs of  systemic illness or infection. [name] patient is nontoxic-appearing on exam and vital signs are within normal limits.   I have reviewed [name] triage vital signs and [name] nursing notes. Pertinent labs &imaging results that were available during my care of [name] patient were reviewed by me and considered in my medical decision making (see chart for details).  After history, exam, and medical workup I feel [name] patient has been appropriately medically screened and is safe for discharge home. Pertinent diagnoses were discussed with [name] patient. Patient was given return precautions.       Monesha Monreal, MD 11/26/17 0233

## 2017-11-26 NOTE — ED Triage Notes (Signed)
Pt reports he stepped on "something" about 30 min ago and noticed he was bleeding on the bottom of his foot. Appears to have a splinter in bottom of L foot.

## 2017-12-03 ENCOUNTER — Ambulatory Visit: Payer: PPO

## 2017-12-03 ENCOUNTER — Encounter: Payer: Self-pay | Admitting: Podiatry

## 2017-12-03 ENCOUNTER — Ambulatory Visit: Payer: PPO | Admitting: Podiatry

## 2017-12-03 VITALS — BP 138/68 | HR 60 | Resp 16

## 2017-12-03 DIAGNOSIS — S90812A Abrasion, left foot, initial encounter: Secondary | ICD-10-CM | POA: Diagnosis not present

## 2017-12-03 DIAGNOSIS — S90852A Superficial foreign body, left foot, initial encounter: Secondary | ICD-10-CM

## 2017-12-06 NOTE — Progress Notes (Signed)
Subjective:   Patient ID: Joseph [name], male   DOB: 83 y.o.   MRN: [id number]   HPI Patient states he stepped on something on the left foot and is worried that it may be infected or giving him a problem he thinks he had something removed at the emergency room.  Patient does not smoke likes to be active   Review of Systems  All other systems reviewed and are negative.       Objective:  Physical Exam  Constitutional: He appears well-developed and well-nourished.  Cardiovascular: Intact distal pulses.  Pulmonary/Chest: Effort normal.  Musculoskeletal: Normal range of motion.  Neurological: He is alert.  Skin: Skin is warm.  Nursing note and vitals reviewed.   Neurovascular status intact muscle strength adequate range of motion within normal limits with patient found to have small lesion plantar left that has no redness no proximal edema erythema or drainage noted and mild discomfort when palpated.  Good digital perfusion well oriented x3     Assessment:  Small inflammatory lesion with no indications of significant pathology     Plan:  Debridement of the area with H&P done and do not recommend aggressive treatment and reviewed x-rays indicating no signs of foreign body  X-rays are negative for calcification or other bone pathology

## 2017-12-17 DIAGNOSIS — I10 Essential (primary) hypertension: Secondary | ICD-10-CM | POA: Diagnosis not present

## 2017-12-17 DIAGNOSIS — E7800 Pure hypercholesterolemia, unspecified: Secondary | ICD-10-CM | POA: Diagnosis not present

## 2017-12-17 DIAGNOSIS — E039 Hypothyroidism, unspecified: Secondary | ICD-10-CM | POA: Diagnosis not present

## 2017-12-17 DIAGNOSIS — Z125 Encounter for screening for malignant neoplasm of prostate: Secondary | ICD-10-CM | POA: Diagnosis not present

## 2017-12-17 DIAGNOSIS — Z7982 Long term (current) use of aspirin: Secondary | ICD-10-CM | POA: Diagnosis not present

## 2017-12-22 DIAGNOSIS — D1432 Benign neoplasm of left bronchus and lung: Secondary | ICD-10-CM | POA: Diagnosis not present

## 2017-12-22 DIAGNOSIS — N401 Enlarged prostate with lower urinary tract symptoms: Secondary | ICD-10-CM | POA: Diagnosis not present

## 2017-12-22 DIAGNOSIS — R748 Abnormal levels of other serum enzymes: Secondary | ICD-10-CM | POA: Diagnosis not present

## 2017-12-22 DIAGNOSIS — E7800 Pure hypercholesterolemia, unspecified: Secondary | ICD-10-CM | POA: Diagnosis not present

## 2017-12-22 DIAGNOSIS — I2510 Atherosclerotic heart disease of native coronary artery without angina pectoris: Secondary | ICD-10-CM | POA: Diagnosis not present

## 2017-12-22 DIAGNOSIS — Z7189 Other specified counseling: Secondary | ICD-10-CM | POA: Diagnosis not present

## 2017-12-22 DIAGNOSIS — J449 Chronic obstructive pulmonary disease, unspecified: Secondary | ICD-10-CM | POA: Diagnosis not present

## 2017-12-22 DIAGNOSIS — E039 Hypothyroidism, unspecified: Secondary | ICD-10-CM | POA: Diagnosis not present

## 2017-12-22 DIAGNOSIS — R7303 Prediabetes: Secondary | ICD-10-CM | POA: Diagnosis not present

## 2017-12-22 DIAGNOSIS — Z0001 Encounter for general adult medical examination with abnormal findings: Secondary | ICD-10-CM | POA: Diagnosis not present

## 2017-12-22 DIAGNOSIS — I10 Essential (primary) hypertension: Secondary | ICD-10-CM | POA: Diagnosis not present

## 2017-12-29 DIAGNOSIS — R0789 Other chest pain: Secondary | ICD-10-CM | POA: Diagnosis not present

## 2017-12-29 DIAGNOSIS — R0602 Shortness of breath: Secondary | ICD-10-CM | POA: Diagnosis not present

## 2017-12-29 DIAGNOSIS — I10 Essential (primary) hypertension: Secondary | ICD-10-CM | POA: Diagnosis not present

## 2017-12-29 DIAGNOSIS — E7800 Pure hypercholesterolemia, unspecified: Secondary | ICD-10-CM | POA: Diagnosis not present

## 2017-12-31 DIAGNOSIS — R0602 Shortness of breath: Secondary | ICD-10-CM | POA: Diagnosis not present

## 2017-12-31 DIAGNOSIS — R0789 Other chest pain: Secondary | ICD-10-CM | POA: Diagnosis not present

## 2018-01-20 DIAGNOSIS — M25462 Effusion, left knee: Secondary | ICD-10-CM | POA: Diagnosis not present

## 2018-01-27 ENCOUNTER — Other Ambulatory Visit (HOSPITAL_COMMUNITY)
Admission: RE | Admit: 2018-01-27 | Discharge: 2018-01-27 | Disposition: A | Discharge status: Home / Self Care | Payer: PPO | Source: Other Acute Inpatient Hospital | Attending: Specialist | Admitting: Specialist

## 2018-01-27 DIAGNOSIS — M25462 Effusion, left knee: Secondary | ICD-10-CM | POA: Insufficient documentation

## 2018-01-27 LAB — SYNOVIAL CELL COUNT + DIFF, W/ CRYSTALS
Crystals, Fluid: NONE SEEN
EOSINOPHILS-SYNOVIAL: 0 % (ref 0–1)
LYMPHOCYTES-SYNOVIAL FLD: 2 % (ref 0–20)
Monocyte-Macrophage-Synovial Fluid: 6 % — ABNORMAL LOW (ref 50–90)
NEUTROPHIL, SYNOVIAL: 92 % — AB (ref 0–25)
WBC, Synovial: 75000 /mm3 — ABNORMAL HIGH (ref 0–200)

## 2018-01-28 DIAGNOSIS — Z96659 Presence of unspecified artificial knee joint: Secondary | ICD-10-CM | POA: Diagnosis not present

## 2018-01-28 DIAGNOSIS — M25462 Effusion, left knee: Secondary | ICD-10-CM | POA: Diagnosis not present

## 2018-01-28 DIAGNOSIS — M25662 Stiffness of left knee, not elsewhere classified: Secondary | ICD-10-CM | POA: Diagnosis not present

## 2018-02-01 DIAGNOSIS — R0609 Other forms of dyspnea: Secondary | ICD-10-CM | POA: Diagnosis not present

## 2018-02-01 LAB — BODY FLUID CULTURE

## 2018-02-01 LAB — ANAEROBIC CULTURE

## 2018-02-08 ENCOUNTER — Other Ambulatory Visit (HOSPITAL_COMMUNITY): Payer: Self-pay | Admitting: Cardiology

## 2018-02-08 DIAGNOSIS — T8454XD Infection and inflammatory reaction due to internal left knee prosthesis, subsequent encounter: Secondary | ICD-10-CM | POA: Diagnosis not present

## 2018-02-08 DIAGNOSIS — K7689 Other specified diseases of liver: Secondary | ICD-10-CM

## 2018-02-10 ENCOUNTER — Ambulatory Visit
Admission: RE | Admit: 2018-02-10 | Discharge: 2018-02-10 | Disposition: A | Discharge status: Home / Self Care | Payer: PPO | Source: Ambulatory Visit | Attending: Cardiology | Admitting: Cardiology

## 2018-02-10 DIAGNOSIS — K7689 Other specified diseases of liver: Secondary | ICD-10-CM

## 2018-02-11 DIAGNOSIS — G8929 Other chronic pain: Secondary | ICD-10-CM | POA: Diagnosis not present

## 2018-02-11 DIAGNOSIS — R102 Pelvic and perineal pain: Secondary | ICD-10-CM | POA: Diagnosis not present

## 2018-02-11 DIAGNOSIS — R972 Elevated prostate specific antigen [PSA]: Secondary | ICD-10-CM | POA: Diagnosis not present

## 2018-02-11 DIAGNOSIS — N401 Enlarged prostate with lower urinary tract symptoms: Secondary | ICD-10-CM | POA: Diagnosis not present

## 2018-02-11 DIAGNOSIS — N529 Male erectile dysfunction, unspecified: Secondary | ICD-10-CM | POA: Diagnosis not present

## 2018-02-15 DIAGNOSIS — M25562 Pain in left knee: Secondary | ICD-10-CM | POA: Diagnosis not present

## 2018-02-15 DIAGNOSIS — R0602 Shortness of breath: Secondary | ICD-10-CM | POA: Diagnosis not present

## 2018-02-15 DIAGNOSIS — J441 Chronic obstructive pulmonary disease with (acute) exacerbation: Secondary | ICD-10-CM | POA: Diagnosis not present

## 2018-02-15 DIAGNOSIS — J449 Chronic obstructive pulmonary disease, unspecified: Secondary | ICD-10-CM | POA: Diagnosis not present

## 2018-02-17 ENCOUNTER — Telehealth: Payer: Self-pay

## 2018-02-17 ENCOUNTER — Ambulatory Visit (INDEPENDENT_AMBULATORY_CARE_PROVIDER_SITE_OTHER): Payer: PPO | Admitting: Family

## 2018-02-17 ENCOUNTER — Encounter: Payer: Self-pay | Admitting: Family

## 2018-02-17 VITALS — BP 116/71 | HR 103 | Temp 97.6°F | Ht 72.0 in | Wt 167.0 lb

## 2018-02-17 DIAGNOSIS — T8454XA Infection and inflammatory reaction due to internal left knee prosthesis, initial encounter: Secondary | ICD-10-CM

## 2018-02-17 NOTE — Assessment & Plan Note (Addendum)
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 He is currently on doxycycline and experiencing slowly worsening symptoms. He does not have any current systemic symptoms. I agree with Dr. Noralyn Pick assessment that a two-stage exchange would be indicated here as he will require IV antibiotics given the virility of Staphylococcus Lugendensis. He is current seeking a Museum/gallery conservator. He has been advised to continue to the doxycycline and have a very low threshold to go directly to the hospital if the signs of redness, swelling or pain increase or if he is not able to be seen within the next several days. He is understanding of the instructions.

## 2018-02-17 NOTE — Patient Instructions (Signed)
Nice to meet you.  Recommend continuing to take the doxycycline at this point. Unfortunately that is not going to be enough to solve this infection.  If you are not able to be seen within the next several days would recommend you go to the hospital as this has the potential to continue to worsen.   Agree with Dr. Noralyn Pick plan for 2 step exchange with antibiotics for at least 6 weeks.  Follow up with ID as needed or following hospitalization.

## 2018-02-17 NOTE — Progress Notes (Signed)
Subjective:    Patient ID: Joseph [name], male    DOB: May 16, 1935, 83 y.o.   MRN: [id number]  Chief Complaint  Patient presents with  . New Patient (Initial Visit)    post op left knee infection    HPI:  Joseph [name] is a 83 y.o. male who presents today for initial office visit for evaluation of prosthetic knee joint infection.   Joseph [name] underwent initial left knee total arthoplasty on 10/06/17 secondary to end stage osteoarthritis refractory to conservative treatment. In July he began to experience non-painful effusion with aspiration showing a WBC count of 75,000 with 92% neutrophils. His cultures were positive for Staphylococcus Lugdenensis and he was started on Doxycycline. On follow up on 02/08/18 Dr. Delfino Lovett recommend two-stage procedure. Joseph [name] decided not to undergo surgery at this time and was continued on doxycyline for chronic suppression. He was referred to ID for second opinion.   Joseph [name] has been taking his doxycycline as prescribed with no adverse side effects. He continues to have edema and pain located in his left knee with decreased range of motion. He denies any measurable fevers, chills or sweats. His edema and pain have continued to worsen since initial onset.    Allergies  Allergen Reactions  . Ciprofloxacin Other (See Comments)  . Ciprocin-Fluocin-Procin [Fluocinolone Acetonide] Other (See Comments)    Blisters  . Fluocinolone Acetonide Other (See Comments)  . Imiquimod Other (See Comments)  . Quinolones Other (See Comments)    Pt had to go to hospital  . Statins Other (See Comments)    Legs hurt  . Imiquimod Other (See Comments)    Blisters  . Penicillins Itching, Rash and Other (See Comments)    Has patient had a PCN reaction causing immediate rash, facial/tongue/throat swelling, SOB or lightheadedness with hypotension: Yes Has patient had a PCN reaction causing severe rash involving mucus membranes or skin necrosis: No Has patient had a  PCN reaction that required hospitalization: No Has patient had a PCN reaction occurring within the last 10 years: No If all of the above answers are "NO", then may proceed with Cephalosporin use.       Outpatient Medications Prior to Visit  Medication Sig Dispense Refill  . albuterol (PROVENTIL HFA;VENTOLIN HFA) 108 (90 Base) MCG/ACT inhaler Inhale 1 puff into the lungs every 6 (six) hours as needed for wheezing or shortness of breath.     Marland Kitchen amLODipine (NORVASC) 5 MG tablet Take 1 tablet (5 mg total) by mouth daily. 90 tablet 1  . aspirin 81 MG chewable tablet Chew 81 mg by mouth daily.    . diphenhydrAMINE (BENADRYL) 25 MG tablet Take 2 tablets (50 mg total) by mouth every 4 (four) hours as needed (swelling and itching). 20 tablet 0  . docusate sodium (COLACE) 100 MG capsule Take 1 capsule (100 mg total) by mouth 2 (two) times daily. 40 capsule 1  . doxycycline (VIBRA-TABS) 100 MG tablet Take 100 mg by mouth 2 (two) times daily.    . Dutasteride-Tamsulosin HCl (JALYN) 0.5-0.4 MG CAPS Take 1 capsule by mouth daily.     . famotidine (PEPCID) 20 MG tablet famotidine 20 mg tablet   20 mg by oral route.    . hydrochlorothiazide (,MICROZIDE/HYDRODIURIL,) 12.5 MG capsule TAKE ONE CAPSULE BY MOUTH EVERY DAY 90 capsule 4  . ibuprofen (ADVIL,MOTRIN) 800 MG tablet Take 1 tablet (800 mg total) by mouth 3 (three) times daily as needed for mild pain (not relieved by acetaminophen).  30 tablet 0  . levothyroxine (SYNTHROID) 125 MCG tablet Take 125 mcg by mouth daily.     Marland Kitchen lovastatin (MEVACOR) 40 MG tablet     . magnesium oxide (MAG-OX) 400 MG tablet Take 400 mg by mouth daily.    Marland Kitchen omeprazole (PRILOSEC) 20 MG capsule Take 20 mg by mouth daily.    . polyethylene glycol (MIRALAX / GLYCOLAX) packet Take 17 g by mouth daily as needed for mild constipation. 14 each 0  . Tiotropium Bromide Monohydrate (SPIRIVA RESPIMAT) 2.5 MCG/ACT AERS Inhale 1 puff into the lungs daily.     No facility-administered  medications prior to visit.      Past Medical History:  Diagnosis Date  . Arthritis   . COPD (chronic obstructive pulmonary disease) ([place])   . Dyspnea    on exertion  . Hyperlipidemia   . Hypertension   . Pneumothorax, spontaneous, tension 1960  . SOB (shortness of breath) on exertion       Past Surgical History:  Procedure Laterality Date  . EYE SURGERY     bilateral cataract with lens implants  . LUNG SURGERY     40 years ago  . TOE SURGERY    . TOTAL KNEE ARTHROPLASTY Left 10/06/2017   Procedure: LEFT TOTAL KNEE ARTHROPLASTY;  Surgeon: Susa Day, MD;  Location: [facility];  Service: Orthopedics;  Laterality: Left;  Adductor Block      History reviewed. No pertinent family history.    Social History   Socioeconomic History  . Marital status: Divorced    Spouse name: Not on file  . Number of children: 0  . Years of education: Not on file  . Highest education level: Not on file  Occupational History  . Occupation: attorney  Social Needs  . Financial resource strain: Not on file  . Food insecurity:    Worry: Not on file    Inability: Not on file  . Transportation needs:    Medical: Not on file    Non-medical: Not on file  Tobacco Use  . Smoking status: Former Smoker    Packs/day: 1.00    Years: 5.00    Pack years: 5.00    Types: Cigarettes    Last attempt to quit: 11/24/1958    Years since quitting: 59.2  . Smokeless tobacco: Never Used  Substance and Sexual Activity  . Alcohol use: No  . Drug use: No  . Sexual activity: Not on file  Lifestyle  . Physical activity:    Days per week: Not on file    Minutes per session: Not on file  . Stress: Not on file  Relationships  . Social connections:    Talks on phone: Not on file    Gets together: Not on file    Attends religious service: Not on file    Active member of club or organization: Not on file    Attends meetings of clubs or organizations: Not on file    Relationship status: Not on file  .  Intimate partner violence:    Fear of current or ex partner: Not on file    Emotionally abused: Not on file    Physically abused: Not on file    Forced sexual activity: Not on file  Other Topics Concern  . Not on file  Social History Narrative  . Not on file      Review of Systems  Constitutional: Negative for chills, fatigue and fever.  Respiratory: Negative for cough, chest tightness and  shortness of breath.   Cardiovascular: Negative for chest pain and leg swelling.  Musculoskeletal: Positive for joint swelling.  Skin: Negative for rash.  Hematological: Negative for adenopathy.       Objective:    BP 116/71   Pulse (!) 103   Temp 97.6 F (36.4 C)   Ht 6' (1.829 m)   Wt 167 lb (75.8 kg)   BMI 22.65 kg/m  Nursing note and vital signs reviewed.  Physical Exam  Constitutional: He is oriented to person, place, and time. He appears well-developed and well-nourished. No distress.  Cardiovascular: Normal rate, regular rhythm, normal heart sounds and intact distal pulses.  Pulmonary/Chest: Effort normal and breath sounds normal.  Musculoskeletal:  Left knee with surgical scar anteriorly and significant edema and warmth. Range of motion is limited secondary to pain and edema. Distal pulses and sensations are intact and appropriate.   Neurological: He is alert and oriented to person, place, and time.  Skin: Skin is warm and dry.  Psychiatric: He has a normal mood and affect. His behavior is normal. Judgment and thought content normal.        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 He is currently on doxycycline and experiencing slowly worsening symptoms. He does not have any current systemic symptoms. I agree with Dr. Noralyn Pick  assessment that a two-stage exchange would be indicated here as he will require IV antibiotics given the virility of Staphylococcus Lugendensis. He is current seeking a Museum/gallery conservator. He has been advised to continue to the doxycycline and have a very low threshold to go directly to the hospital if the signs of redness, swelling or pain increase or if he is not able to be seen within the next several days. He is understanding of the instructions.           I am having [place] L. Fasig maintain his levothyroxine, hydrochlorothiazide, amLODipine, diphenhydrAMINE, magnesium oxide, Dutasteride-Tamsulosin HCl, albuterol, omeprazole, Tiotropium Bromide Monohydrate, ibuprofen, docusate sodium, polyethylene glycol, aspirin, lovastatin, famotidine, and doxycycline.   Follow-up: As needed following surgical intervention.    Terri Piedra, MSN, FNP-C Nurse Practitioner [facility] Office phone: [phone] Pager: [place] number: [phone]

## 2018-02-17 NOTE — Telephone Encounter (Signed)
Patient left a voicemail for Terri Piedra, NP to call back regarding questions he had after this appointment today. Patient would like a call back at [phone]. [place]

## 2018-02-17 NOTE — Telephone Encounter (Signed)
Spoke with patient and answered questions regarding our office visit this morning.

## 2018-02-21 DIAGNOSIS — M25562 Pain in left knee: Secondary | ICD-10-CM | POA: Diagnosis not present

## 2018-02-21 DIAGNOSIS — T8454XA Infection and inflammatory reaction due to internal left knee prosthesis, initial encounter: Secondary | ICD-10-CM | POA: Diagnosis not present

## 2018-02-21 DIAGNOSIS — T859XXA Unspecified complication of internal prosthetic device, implant and graft, initial encounter: Secondary | ICD-10-CM | POA: Diagnosis not present

## 2018-02-24 DIAGNOSIS — Z01818 Encounter for other preprocedural examination: Secondary | ICD-10-CM | POA: Diagnosis not present

## 2018-02-24 DIAGNOSIS — E039 Hypothyroidism, unspecified: Secondary | ICD-10-CM | POA: Diagnosis not present

## 2018-02-24 DIAGNOSIS — I10 Essential (primary) hypertension: Secondary | ICD-10-CM | POA: Diagnosis not present

## 2018-02-24 DIAGNOSIS — R7303 Prediabetes: Secondary | ICD-10-CM | POA: Diagnosis not present

## 2018-02-24 DIAGNOSIS — E7800 Pure hypercholesterolemia, unspecified: Secondary | ICD-10-CM | POA: Diagnosis not present

## 2018-02-24 DIAGNOSIS — Z8709 Personal history of other diseases of the respiratory system: Secondary | ICD-10-CM | POA: Diagnosis not present

## 2018-02-24 DIAGNOSIS — J449 Chronic obstructive pulmonary disease, unspecified: Secondary | ICD-10-CM | POA: Diagnosis not present

## 2018-02-24 DIAGNOSIS — M79604 Pain in right leg: Secondary | ICD-10-CM | POA: Diagnosis not present

## 2018-02-24 DIAGNOSIS — I2510 Atherosclerotic heart disease of native coronary artery without angina pectoris: Secondary | ICD-10-CM | POA: Diagnosis not present

## 2018-02-28 DIAGNOSIS — J441 Chronic obstructive pulmonary disease with (acute) exacerbation: Secondary | ICD-10-CM | POA: Diagnosis not present

## 2018-03-01 ENCOUNTER — Other Ambulatory Visit (HOSPITAL_COMMUNITY): Payer: Self-pay | Admitting: Respiratory Therapy

## 2018-03-01 DIAGNOSIS — J441 Chronic obstructive pulmonary disease with (acute) exacerbation: Secondary | ICD-10-CM

## 2018-03-08 DIAGNOSIS — I10 Essential (primary) hypertension: Secondary | ICD-10-CM | POA: Diagnosis not present

## 2018-03-08 DIAGNOSIS — R490 Dysphonia: Secondary | ICD-10-CM | POA: Diagnosis not present

## 2018-03-11 ENCOUNTER — Ambulatory Visit (HOSPITAL_COMMUNITY)
Admission: RE | Admit: 2018-03-11 | Discharge: 2018-03-11 | Disposition: A | Discharge status: Home / Self Care | Payer: PPO | Source: Ambulatory Visit | Attending: Internal Medicine | Admitting: Internal Medicine

## 2018-03-11 DIAGNOSIS — J441 Chronic obstructive pulmonary disease with (acute) exacerbation: Secondary | ICD-10-CM | POA: Diagnosis not present

## 2018-03-11 LAB — PULMONARY FUNCTION TEST
DL/VA % PRED: 57 %
DL/VA: 2.68 ml/min/mmHg/L
DLCO UNC % PRED: 44 %
DLCO unc: 15.73 ml/min/mmHg
FEF 25-75 Pre: 2.42 L/sec
FEF2575-%PRED-PRE: 115 %
FEV1-%Pred-Pre: 105 %
FEV1-Pre: 3.25 L
FEV1FVC-%PRED-PRE: 103 %
FEV6-%Pred-Pre: 109 %
FEV6-Pre: 4.4 L
FEV6FVC-%Pred-Pre: 106 %
FVC-%Pred-Pre: 102 %
FVC-Pre: 4.41 L
PRE FEV6/FVC RATIO: 100 %
Pre FEV1/FVC ratio: 74 %

## 2018-03-14 DIAGNOSIS — I2510 Atherosclerotic heart disease of native coronary artery without angina pectoris: Secondary | ICD-10-CM | POA: Diagnosis not present

## 2018-03-14 DIAGNOSIS — J449 Chronic obstructive pulmonary disease, unspecified: Secondary | ICD-10-CM | POA: Diagnosis not present

## 2018-03-14 DIAGNOSIS — F5101 Primary insomnia: Secondary | ICD-10-CM | POA: Diagnosis not present

## 2018-03-14 DIAGNOSIS — I10 Essential (primary) hypertension: Secondary | ICD-10-CM | POA: Diagnosis not present

## 2018-03-23 ENCOUNTER — Ambulatory Visit (INDEPENDENT_AMBULATORY_CARE_PROVIDER_SITE_OTHER): Payer: PPO | Admitting: Emergency Medicine

## 2018-03-23 ENCOUNTER — Encounter: Payer: Self-pay | Admitting: Emergency Medicine

## 2018-03-23 VITALS — BP 122/72 | HR 107 | Ht 72.0 in | Wt 166.0 lb

## 2018-03-23 DIAGNOSIS — J432 Centrilobular emphysema: Secondary | ICD-10-CM

## 2018-03-23 NOTE — Patient Instructions (Signed)
Walking oximetry on room air today Continue Anoro one inhalation daily Take albuterol 2 puffs up to every 4 hours if needed for shortness of breath.  Based on your reassuring breathing tests and your current clinical symptoms you should be low risk for surgery and general anesthesia.  No pulmonary barriers identified.  Note that Dr. Einar Gip has given cardiac clearance previous to your April 2019 surgery as well. Follow with Dr Lamonte Sakai in 6 months or sooner if you have any problems

## 2018-03-23 NOTE — Progress Notes (Signed)
Subjective:    Patient ID: Joseph [name], male    DOB: Mar 12, 1935, 83 y.o.   MRN: [id number]  HPI 83 year old gentleman with a minimal tobacco history (10 pack years), history of arthritis, hyperlipidemia.  He has been seen in our office before by Dr. Murlean Iba for mild COPD and an abnormal CT scan of the chest with an anterior left lung base nodule that was stable on serial scanning, negative on PET scan that was done 01/14/2016.   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 First operation was done 10/06/17. His repeat sgy is scheduled for 04/01/18.   Pulmonary function testing done on 03/11/2018 reviewed by me.  This shows grossly normal airflows with a decreased diffusion capacity that does not correct for his alveolar volume.  He has been treated with Spiriva for 1-2 years. Started to notice more SOB about a month ago, no real response to albuterol. No cough or wheeze. A trial by Dr Shelia Media changing to Trelegy was done, stopped soon after due to UA irritation. Then changed to Anoro, which he is using currently. He feels some benefit, is less dyspneic. He does still have UA irritation occasionally. He is unable to exert much due to his knee.   He has seen Dr Einar Gip prior to his April surgery, was cleared from cardiac standpoint at that time.   Review of Systems  Constitutional: Negative for fever and unexpected weight change.  HENT: Negative for congestion, dental problem, ear pain, nosebleeds, postnasal drip, rhinorrhea, sinus pressure, sneezing, sore throat and trouble swallowing.   Eyes: Negative for redness and itching.  Respiratory: Negative for cough, chest tightness, shortness of breath and wheezing.   Cardiovascular: Negative for palpitations and leg swelling.  Gastrointestinal: Negative for nausea and vomiting.  Genitourinary: Negative for dysuria.  Musculoskeletal: Negative for joint swelling.  Skin: Negative for rash.    Neurological: Negative for headaches.  Hematological: Does not bruise/bleed easily.  Psychiatric/Behavioral: Negative for dysphoric mood. The patient is not nervous/anxious.    Past Medical History:  Diagnosis Date  . Arthritis   . COPD (chronic obstructive pulmonary disease) ([place])   . Dyspnea    on exertion  . Hyperlipidemia   . Hypertension   . Pneumothorax, spontaneous, tension 1960  . SOB (shortness of breath) on exertion      No family history on file.   Social History   Socioeconomic History  . Marital status: Divorced    Spouse name: Not on file  . Number of children: 0  . Years of education: Not on file  . Highest education level: Not on file  Occupational History  . Occupation: attorney  Social Needs  . Financial resource strain: Not on file  . Food insecurity:    Worry: Not on file    Inability: Not on file  . Transportation needs:    Medical: Not on file    Non-medical: Not on file  Tobacco Use  . Smoking status: Former Smoker    Packs/day: 1.00    Years: 5.00    Pack years: 5.00    Types: Cigarettes    Last attempt to quit: 11/24/1958    Years since quitting: 59.3  . Smokeless tobacco: Never Used  Substance and Sexual Activity  . Alcohol use: No  . Drug use: No  . Sexual activity: Not on file  Lifestyle  . Physical activity:    Days per week: Not on file  Minutes per session: Not on file  . Stress: Not on file  Relationships  . Social connections:    Talks on phone: Not on file    Gets together: Not on file    Attends religious service: Not on file    Active member of club or organization: Not on file    Attends meetings of clubs or organizations: Not on file    Relationship status: Not on file  . Intimate partner violence:    Fear of current or ex partner: Not on file    Emotionally abused: Not on file    Physically abused: Not on file    Forced sexual activity: Not on file  Other Topics Concern  . Not on file  Social History  Narrative  . Not on file     Allergies  Allergen Reactions  . Ciprofloxacin Other (See Comments)  . Ciprocin-Fluocin-Procin [Fluocinolone Acetonide] Other (See Comments)    Blisters  . Fluocinolone Acetonide Other (See Comments)  . Imiquimod Other (See Comments)  . Quinolones Other (See Comments)    Pt had to go to hospital  . Statins Other (See Comments)    Legs hurt  . Imiquimod Other (See Comments)    Blisters  . Penicillins Itching, Rash and Other (See Comments)    Has patient had a PCN reaction causing immediate rash, facial/tongue/throat swelling, SOB or lightheadedness with hypotension: Yes Has patient had a PCN reaction causing severe rash involving mucus membranes or skin necrosis: No Has patient had a PCN reaction that required hospitalization: No Has patient had a PCN reaction occurring within the last 10 years: No If all of the above answers are "NO", then may proceed with Cephalosporin use.      Outpatient Medications Prior to Visit  Medication Sig Dispense Refill  . albuterol (PROVENTIL HFA;VENTOLIN HFA) 108 (90 Base) MCG/ACT inhaler Inhale 1 puff into the lungs every 6 (six) hours as needed for wheezing or shortness of breath.     Marland Kitchen amLODipine (NORVASC) 5 MG tablet Take 1 tablet (5 mg total) by mouth daily. 90 tablet 1  . aspirin 81 MG chewable tablet Chew 81 mg by mouth daily.    . diphenhydrAMINE (BENADRYL) 25 MG tablet Take 2 tablets (50 mg total) by mouth every 4 (four) hours as needed (swelling and itching). 20 tablet 0  . docusate sodium (COLACE) 100 MG capsule Take 1 capsule (100 mg total) by mouth 2 (two) times daily. 40 capsule 1  . Dutasteride-Tamsulosin HCl (JALYN) 0.5-0.4 MG CAPS Take 1 capsule by mouth daily.     . famotidine (PEPCID) 20 MG tablet famotidine 20 mg tablet   20 mg by oral route.    . hydrochlorothiazide (,MICROZIDE/HYDRODIURIL,) 12.5 MG capsule TAKE ONE CAPSULE BY MOUTH EVERY DAY 90 capsule 4  . ibuprofen (ADVIL,MOTRIN) 800 MG tablet  Take 1 tablet (800 mg total) by mouth 3 (three) times daily as needed for mild pain (not relieved by acetaminophen). 30 tablet 0  . levothyroxine (SYNTHROID) 125 MCG tablet Take 125 mcg by mouth daily.     Marland Kitchen lovastatin (MEVACOR) 40 MG tablet     . magnesium oxide (MAG-OX) 400 MG tablet Take 400 mg by mouth daily.    Marland Kitchen omeprazole (PRILOSEC) 20 MG capsule Take 20 mg by mouth daily.    . polyethylene glycol (MIRALAX / GLYCOLAX) packet Take 17 g by mouth daily as needed for mild constipation. 14 each 0  . Tiotropium Bromide Monohydrate (SPIRIVA RESPIMAT) 2.5 MCG/ACT AERS  Inhale 1 puff into the lungs daily.    Marland Kitchen doxycycline (VIBRA-TABS) 100 MG tablet Take 100 mg by mouth 2 (two) times daily.     No facility-administered medications prior to visit.         Objective:   Physical Exam  Vitals:   03/23/18 1427  BP: 122/72  Pulse: (!) 107  SpO2: 98%  Weight: 166 lb (75.3 kg)  Height: 6' (1.829 m)   Gen: Thin man, in no distress,  normal affect  ENT: No lesions,  mouth clear,  oropharynx clear, no postnasal drip  Neck: No JVD, no stridor  Lungs: No use of accessory muscles, no wheeze or crackles  Cardiovascular: RRR, heart sounds normal, no murmur or gallops, no peripheral edema  Musculoskeletal: No deformities, L knees is swollen, no erythema, discharge  Neuro: alert, non focal  Skin: Warm, no lesions or rash      Assessment & Plan:  COPD (chronic obstructive pulmonary disease) (HCC) Mild based on his pulmonary function testing from 03/11/2018.  He may have a diffusion defect and we will check for occult hypoxemia.  He did apparently get a clinical response to Anoro and he can continue this for now.  I question whether some of his evolving dyspnea is due to deconditioning since he is been out of commission due to his knee.  I consider him to be low risk from pulmonary standpoint for surgery or general anesthesia.  He tells me that he was evaluated by Dr. Einar Gip prior to his original  surgery in April.  Certainly adequate cardiac clearance should be obtained if there is any clinical change.  I think he should be okay from both cardiac and pulmonary standpoint.  Baltazar Apo, MD, PhD 03/23/2018, 3:04 PM [facility] [phone] or if no answer [phone]

## 2018-03-23 NOTE — Assessment & Plan Note (Signed)
Mild based on his pulmonary function testing from 03/11/2018.  He may have a diffusion defect and we will check for occult hypoxemia.  He did apparently get a clinical response to Anoro and he can continue this for now.  I question whether some of his evolving dyspnea is due to deconditioning since he is been out of commission due to his knee.  I consider him to be low risk from pulmonary standpoint for surgery or general anesthesia.  He tells me that he was evaluated by Dr. Einar Gip prior to his original surgery in April.  Certainly adequate cardiac clearance should be obtained if there is any clinical change.  I think he should be okay from both cardiac and pulmonary standpoint.

## 2018-03-25 DIAGNOSIS — K5900 Constipation, unspecified: Secondary | ICD-10-CM | POA: Diagnosis not present

## 2018-03-25 DIAGNOSIS — I10 Essential (primary) hypertension: Secondary | ICD-10-CM | POA: Diagnosis not present

## 2018-03-28 DIAGNOSIS — R350 Frequency of micturition: Secondary | ICD-10-CM | POA: Diagnosis not present

## 2018-04-01 DIAGNOSIS — I951 Orthostatic hypotension: Secondary | ICD-10-CM | POA: Diagnosis not present

## 2018-04-01 DIAGNOSIS — T8454XD Infection and inflammatory reaction due to internal left knee prosthesis, subsequent encounter: Secondary | ICD-10-CM | POA: Diagnosis not present

## 2018-04-01 DIAGNOSIS — Z471 Aftercare following joint replacement surgery: Secondary | ICD-10-CM | POA: Diagnosis not present

## 2018-04-01 DIAGNOSIS — M6588 Other synovitis and tenosynovitis, other site: Secondary | ICD-10-CM | POA: Diagnosis not present

## 2018-04-01 DIAGNOSIS — K5900 Constipation, unspecified: Secondary | ICD-10-CM | POA: Diagnosis not present

## 2018-04-01 DIAGNOSIS — R110 Nausea: Secondary | ICD-10-CM | POA: Diagnosis not present

## 2018-04-01 DIAGNOSIS — I2510 Atherosclerotic heart disease of native coronary artery without angina pectoris: Secondary | ICD-10-CM | POA: Diagnosis not present

## 2018-04-01 DIAGNOSIS — R2689 Other abnormalities of gait and mobility: Secondary | ICD-10-CM | POA: Diagnosis not present

## 2018-04-01 DIAGNOSIS — N400 Enlarged prostate without lower urinary tract symptoms: Secondary | ICD-10-CM | POA: Diagnosis not present

## 2018-04-01 DIAGNOSIS — R278 Other lack of coordination: Secondary | ICD-10-CM | POA: Diagnosis not present

## 2018-04-01 DIAGNOSIS — G8918 Other acute postprocedural pain: Secondary | ICD-10-CM | POA: Diagnosis not present

## 2018-04-01 DIAGNOSIS — Z87891 Personal history of nicotine dependence: Secondary | ICD-10-CM | POA: Diagnosis not present

## 2018-04-01 DIAGNOSIS — M6281 Muscle weakness (generalized): Secondary | ICD-10-CM | POA: Diagnosis not present

## 2018-04-01 DIAGNOSIS — Z96652 Presence of left artificial knee joint: Secondary | ICD-10-CM | POA: Diagnosis not present

## 2018-04-01 DIAGNOSIS — E861 Hypovolemia: Secondary | ICD-10-CM | POA: Diagnosis not present

## 2018-04-01 DIAGNOSIS — E039 Hypothyroidism, unspecified: Secondary | ICD-10-CM | POA: Diagnosis not present

## 2018-04-01 DIAGNOSIS — E7800 Pure hypercholesterolemia, unspecified: Secondary | ICD-10-CM | POA: Diagnosis not present

## 2018-04-01 DIAGNOSIS — Z4789 Encounter for other orthopedic aftercare: Secondary | ICD-10-CM | POA: Diagnosis not present

## 2018-04-01 DIAGNOSIS — I10 Essential (primary) hypertension: Secondary | ICD-10-CM | POA: Diagnosis not present

## 2018-04-01 DIAGNOSIS — R42 Dizziness and giddiness: Secondary | ICD-10-CM | POA: Diagnosis not present

## 2018-04-01 DIAGNOSIS — R52 Pain, unspecified: Secondary | ICD-10-CM | POA: Diagnosis not present

## 2018-04-01 DIAGNOSIS — K219 Gastro-esophageal reflux disease without esophagitis: Secondary | ICD-10-CM | POA: Diagnosis not present

## 2018-04-01 DIAGNOSIS — B957 Other staphylococcus as the cause of diseases classified elsewhere: Secondary | ICD-10-CM | POA: Diagnosis not present

## 2018-04-01 DIAGNOSIS — T8454XA Infection and inflammatory reaction due to internal left knee prosthesis, initial encounter: Secondary | ICD-10-CM | POA: Diagnosis not present

## 2018-04-01 DIAGNOSIS — E876 Hypokalemia: Secondary | ICD-10-CM | POA: Diagnosis not present

## 2018-04-01 DIAGNOSIS — Z880 Allergy status to penicillin: Secondary | ICD-10-CM | POA: Diagnosis not present

## 2018-04-01 DIAGNOSIS — Z452 Encounter for adjustment and management of vascular access device: Secondary | ICD-10-CM | POA: Diagnosis not present

## 2018-04-01 DIAGNOSIS — J449 Chronic obstructive pulmonary disease, unspecified: Secondary | ICD-10-CM | POA: Diagnosis not present

## 2018-04-09 DIAGNOSIS — L089 Local infection of the skin and subcutaneous tissue, unspecified: Secondary | ICD-10-CM | POA: Diagnosis not present

## 2018-04-09 DIAGNOSIS — Z5181 Encounter for therapeutic drug level monitoring: Secondary | ICD-10-CM | POA: Diagnosis not present

## 2018-04-09 DIAGNOSIS — D649 Anemia, unspecified: Secondary | ICD-10-CM | POA: Diagnosis not present

## 2018-04-09 DIAGNOSIS — R110 Nausea: Secondary | ICD-10-CM | POA: Diagnosis not present

## 2018-04-09 DIAGNOSIS — L511 Stevens-Johnson syndrome: Secondary | ICD-10-CM | POA: Diagnosis not present

## 2018-04-09 DIAGNOSIS — I10 Essential (primary) hypertension: Secondary | ICD-10-CM | POA: Diagnosis not present

## 2018-04-09 DIAGNOSIS — R2689 Other abnormalities of gait and mobility: Secondary | ICD-10-CM | POA: Diagnosis not present

## 2018-04-09 DIAGNOSIS — D62 Acute posthemorrhagic anemia: Secondary | ICD-10-CM | POA: Diagnosis not present

## 2018-04-09 DIAGNOSIS — J449 Chronic obstructive pulmonary disease, unspecified: Secondary | ICD-10-CM | POA: Diagnosis not present

## 2018-04-09 DIAGNOSIS — Z792 Long term (current) use of antibiotics: Secondary | ICD-10-CM | POA: Diagnosis not present

## 2018-04-09 DIAGNOSIS — Z452 Encounter for adjustment and management of vascular access device: Secondary | ICD-10-CM | POA: Diagnosis not present

## 2018-04-09 DIAGNOSIS — K219 Gastro-esophageal reflux disease without esophagitis: Secondary | ICD-10-CM | POA: Diagnosis not present

## 2018-04-09 DIAGNOSIS — M6281 Muscle weakness (generalized): Secondary | ICD-10-CM | POA: Diagnosis not present

## 2018-04-09 DIAGNOSIS — I2510 Atherosclerotic heart disease of native coronary artery without angina pectoris: Secondary | ICD-10-CM | POA: Diagnosis not present

## 2018-04-09 DIAGNOSIS — R278 Other lack of coordination: Secondary | ICD-10-CM | POA: Diagnosis not present

## 2018-04-09 DIAGNOSIS — T8454XD Infection and inflammatory reaction due to internal left knee prosthesis, subsequent encounter: Secondary | ICD-10-CM | POA: Diagnosis not present

## 2018-04-09 DIAGNOSIS — E039 Hypothyroidism, unspecified: Secondary | ICD-10-CM | POA: Diagnosis not present

## 2018-04-09 DIAGNOSIS — K5900 Constipation, unspecified: Secondary | ICD-10-CM | POA: Diagnosis not present

## 2018-04-09 DIAGNOSIS — M25562 Pain in left knee: Secondary | ICD-10-CM | POA: Diagnosis not present

## 2018-04-09 DIAGNOSIS — Z4789 Encounter for other orthopedic aftercare: Secondary | ICD-10-CM | POA: Diagnosis not present

## 2018-04-09 DIAGNOSIS — E785 Hyperlipidemia, unspecified: Secondary | ICD-10-CM | POA: Diagnosis not present

## 2018-04-09 DIAGNOSIS — R52 Pain, unspecified: Secondary | ICD-10-CM | POA: Diagnosis not present

## 2018-04-09 DIAGNOSIS — N400 Enlarged prostate without lower urinary tract symptoms: Secondary | ICD-10-CM | POA: Diagnosis not present

## 2018-04-09 DIAGNOSIS — E7800 Pure hypercholesterolemia, unspecified: Secondary | ICD-10-CM | POA: Diagnosis not present

## 2018-04-11 DIAGNOSIS — I10 Essential (primary) hypertension: Secondary | ICD-10-CM | POA: Diagnosis not present

## 2018-04-11 DIAGNOSIS — R52 Pain, unspecified: Secondary | ICD-10-CM | POA: Diagnosis not present

## 2018-04-11 DIAGNOSIS — T8454XD Infection and inflammatory reaction due to internal left knee prosthesis, subsequent encounter: Secondary | ICD-10-CM | POA: Diagnosis not present

## 2018-04-11 DIAGNOSIS — I2510 Atherosclerotic heart disease of native coronary artery without angina pectoris: Secondary | ICD-10-CM | POA: Diagnosis not present

## 2018-04-11 DIAGNOSIS — N400 Enlarged prostate without lower urinary tract symptoms: Secondary | ICD-10-CM | POA: Diagnosis not present

## 2018-04-11 DIAGNOSIS — E785 Hyperlipidemia, unspecified: Secondary | ICD-10-CM | POA: Diagnosis not present

## 2018-04-11 DIAGNOSIS — J449 Chronic obstructive pulmonary disease, unspecified: Secondary | ICD-10-CM | POA: Diagnosis not present

## 2018-04-11 DIAGNOSIS — K219 Gastro-esophageal reflux disease without esophagitis: Secondary | ICD-10-CM | POA: Diagnosis not present

## 2018-04-11 DIAGNOSIS — K5900 Constipation, unspecified: Secondary | ICD-10-CM | POA: Diagnosis not present

## 2018-04-11 DIAGNOSIS — M6281 Muscle weakness (generalized): Secondary | ICD-10-CM | POA: Diagnosis not present

## 2018-04-11 DIAGNOSIS — Z4789 Encounter for other orthopedic aftercare: Secondary | ICD-10-CM | POA: Diagnosis not present

## 2018-04-11 DIAGNOSIS — E039 Hypothyroidism, unspecified: Secondary | ICD-10-CM | POA: Diagnosis not present

## 2018-04-12 ENCOUNTER — Telehealth: Payer: Self-pay | Admitting: *Deleted

## 2018-04-12 DIAGNOSIS — Z4789 Encounter for other orthopedic aftercare: Secondary | ICD-10-CM | POA: Diagnosis not present

## 2018-04-12 DIAGNOSIS — I2510 Atherosclerotic heart disease of native coronary artery without angina pectoris: Secondary | ICD-10-CM | POA: Diagnosis not present

## 2018-04-12 DIAGNOSIS — M6281 Muscle weakness (generalized): Secondary | ICD-10-CM | POA: Diagnosis not present

## 2018-04-12 DIAGNOSIS — D62 Acute posthemorrhagic anemia: Secondary | ICD-10-CM | POA: Diagnosis not present

## 2018-04-12 DIAGNOSIS — K219 Gastro-esophageal reflux disease without esophagitis: Secondary | ICD-10-CM | POA: Diagnosis not present

## 2018-04-12 DIAGNOSIS — E785 Hyperlipidemia, unspecified: Secondary | ICD-10-CM | POA: Diagnosis not present

## 2018-04-12 DIAGNOSIS — N400 Enlarged prostate without lower urinary tract symptoms: Secondary | ICD-10-CM | POA: Diagnosis not present

## 2018-04-12 DIAGNOSIS — T8454XD Infection and inflammatory reaction due to internal left knee prosthesis, subsequent encounter: Secondary | ICD-10-CM | POA: Diagnosis not present

## 2018-04-12 DIAGNOSIS — E039 Hypothyroidism, unspecified: Secondary | ICD-10-CM | POA: Diagnosis not present

## 2018-04-12 DIAGNOSIS — R52 Pain, unspecified: Secondary | ICD-10-CM | POA: Diagnosis not present

## 2018-04-12 DIAGNOSIS — I10 Essential (primary) hypertension: Secondary | ICD-10-CM | POA: Diagnosis not present

## 2018-04-12 DIAGNOSIS — J449 Chronic obstructive pulmonary disease, unspecified: Secondary | ICD-10-CM | POA: Diagnosis not present

## 2018-04-12 NOTE — Telephone Encounter (Signed)
Patient called to advise his is in a facility at this time [facility]. He is on IV antibiotics and has been seeing a provider in [place] but wants to see [place] as that is to far to travel. He has signed a release there and wants [place] to get his medical records for the next time we see him. He wants an appointment for after he is released from the facility and he does not know what day that is at this time. He advised he will call back once he knows his release date.   Called the ID clinic in [place] [phone] and had to leave a message for the medical records department to give me a call to see what we need to do to have information sent as the patient states he has already signed a release.

## 2018-04-15 DIAGNOSIS — I10 Essential (primary) hypertension: Secondary | ICD-10-CM | POA: Diagnosis not present

## 2018-04-15 DIAGNOSIS — T8454XD Infection and inflammatory reaction due to internal left knee prosthesis, subsequent encounter: Secondary | ICD-10-CM | POA: Diagnosis not present

## 2018-04-15 DIAGNOSIS — N400 Enlarged prostate without lower urinary tract symptoms: Secondary | ICD-10-CM | POA: Diagnosis not present

## 2018-04-15 DIAGNOSIS — R52 Pain, unspecified: Secondary | ICD-10-CM | POA: Diagnosis not present

## 2018-04-15 DIAGNOSIS — E039 Hypothyroidism, unspecified: Secondary | ICD-10-CM | POA: Diagnosis not present

## 2018-04-15 DIAGNOSIS — I2510 Atherosclerotic heart disease of native coronary artery without angina pectoris: Secondary | ICD-10-CM | POA: Diagnosis not present

## 2018-04-15 DIAGNOSIS — D62 Acute posthemorrhagic anemia: Secondary | ICD-10-CM | POA: Diagnosis not present

## 2018-04-15 DIAGNOSIS — K219 Gastro-esophageal reflux disease without esophagitis: Secondary | ICD-10-CM | POA: Diagnosis not present

## 2018-04-15 DIAGNOSIS — E785 Hyperlipidemia, unspecified: Secondary | ICD-10-CM | POA: Diagnosis not present

## 2018-04-15 DIAGNOSIS — Z4789 Encounter for other orthopedic aftercare: Secondary | ICD-10-CM | POA: Diagnosis not present

## 2018-04-15 DIAGNOSIS — M6281 Muscle weakness (generalized): Secondary | ICD-10-CM | POA: Diagnosis not present

## 2018-04-15 DIAGNOSIS — J449 Chronic obstructive pulmonary disease, unspecified: Secondary | ICD-10-CM | POA: Diagnosis not present

## 2018-04-18 DIAGNOSIS — M6281 Muscle weakness (generalized): Secondary | ICD-10-CM | POA: Diagnosis not present

## 2018-04-18 DIAGNOSIS — E785 Hyperlipidemia, unspecified: Secondary | ICD-10-CM | POA: Diagnosis not present

## 2018-04-18 DIAGNOSIS — D62 Acute posthemorrhagic anemia: Secondary | ICD-10-CM | POA: Diagnosis not present

## 2018-04-18 DIAGNOSIS — I10 Essential (primary) hypertension: Secondary | ICD-10-CM | POA: Diagnosis not present

## 2018-04-18 DIAGNOSIS — I2510 Atherosclerotic heart disease of native coronary artery without angina pectoris: Secondary | ICD-10-CM | POA: Diagnosis not present

## 2018-04-18 DIAGNOSIS — Z4789 Encounter for other orthopedic aftercare: Secondary | ICD-10-CM | POA: Diagnosis not present

## 2018-04-18 DIAGNOSIS — K219 Gastro-esophageal reflux disease without esophagitis: Secondary | ICD-10-CM | POA: Diagnosis not present

## 2018-04-18 DIAGNOSIS — E039 Hypothyroidism, unspecified: Secondary | ICD-10-CM | POA: Diagnosis not present

## 2018-04-18 DIAGNOSIS — J449 Chronic obstructive pulmonary disease, unspecified: Secondary | ICD-10-CM | POA: Diagnosis not present

## 2018-04-18 DIAGNOSIS — R52 Pain, unspecified: Secondary | ICD-10-CM | POA: Diagnosis not present

## 2018-04-18 DIAGNOSIS — T8454XD Infection and inflammatory reaction due to internal left knee prosthesis, subsequent encounter: Secondary | ICD-10-CM | POA: Diagnosis not present

## 2018-04-18 DIAGNOSIS — N400 Enlarged prostate without lower urinary tract symptoms: Secondary | ICD-10-CM | POA: Diagnosis not present

## 2018-04-18 NOTE — Telephone Encounter (Addendum)
Patient called to follow up about his medical records. RN Called ID clinic in [place] again, had to leave a message at Medical Records extension. RN notified patient.  He states he is will be discharged from [facility], but not likely this week. Landis Gandy, RN

## 2018-04-20 ENCOUNTER — Telehealth: Payer: Self-pay

## 2018-04-20 NOTE — Telephone Encounter (Signed)
Patient called today to find out if we received medical records from [facility] in [place]. No records from ID clinic in [place] have been faxed to our office. Informed patient that we do have any records from [place].  Spoke with Medical record department in [place] informed them that we do not have records for patient who signed a release of information from their office. Patient has an appointment with ID clinic in [place] on 10/18. Office will fax office note from that visit to our office. Additional records will have to be requested from Medical Records in [place].   Called medical records from [facility] to have records faxed.   [place] Records: [place], [place]

## 2018-04-28 DIAGNOSIS — M25562 Pain in left knee: Secondary | ICD-10-CM | POA: Diagnosis not present

## 2018-04-29 DIAGNOSIS — L511 Stevens-Johnson syndrome: Secondary | ICD-10-CM | POA: Diagnosis not present

## 2018-04-29 DIAGNOSIS — Z5181 Encounter for therapeutic drug level monitoring: Secondary | ICD-10-CM | POA: Diagnosis not present

## 2018-04-29 DIAGNOSIS — T8454XD Infection and inflammatory reaction due to internal left knee prosthesis, subsequent encounter: Secondary | ICD-10-CM | POA: Diagnosis not present

## 2018-04-29 DIAGNOSIS — L089 Local infection of the skin and subcutaneous tissue, unspecified: Secondary | ICD-10-CM | POA: Diagnosis not present

## 2018-04-29 DIAGNOSIS — Z452 Encounter for adjustment and management of vascular access device: Secondary | ICD-10-CM | POA: Diagnosis not present

## 2018-04-29 DIAGNOSIS — D649 Anemia, unspecified: Secondary | ICD-10-CM | POA: Diagnosis not present

## 2018-04-29 DIAGNOSIS — Z792 Long term (current) use of antibiotics: Secondary | ICD-10-CM | POA: Diagnosis not present

## 2018-05-02 DIAGNOSIS — E119 Type 2 diabetes mellitus without complications: Secondary | ICD-10-CM | POA: Diagnosis not present

## 2018-05-02 DIAGNOSIS — Z452 Encounter for adjustment and management of vascular access device: Secondary | ICD-10-CM | POA: Diagnosis not present

## 2018-05-02 DIAGNOSIS — L089 Local infection of the skin and subcutaneous tissue, unspecified: Secondary | ICD-10-CM | POA: Diagnosis not present

## 2018-05-03 DIAGNOSIS — L089 Local infection of the skin and subcutaneous tissue, unspecified: Secondary | ICD-10-CM | POA: Diagnosis not present

## 2018-05-03 DIAGNOSIS — Z452 Encounter for adjustment and management of vascular access device: Secondary | ICD-10-CM | POA: Diagnosis not present

## 2018-05-03 DIAGNOSIS — Z792 Long term (current) use of antibiotics: Secondary | ICD-10-CM | POA: Diagnosis not present

## 2018-05-03 DIAGNOSIS — L511 Stevens-Johnson syndrome: Secondary | ICD-10-CM | POA: Diagnosis not present

## 2018-05-03 DIAGNOSIS — D649 Anemia, unspecified: Secondary | ICD-10-CM | POA: Diagnosis not present

## 2018-05-03 DIAGNOSIS — Z5181 Encounter for therapeutic drug level monitoring: Secondary | ICD-10-CM | POA: Diagnosis not present

## 2018-05-03 DIAGNOSIS — T8454XD Infection and inflammatory reaction due to internal left knee prosthesis, subsequent encounter: Secondary | ICD-10-CM | POA: Diagnosis not present

## 2018-05-04 ENCOUNTER — Encounter: Payer: Self-pay | Admitting: Family

## 2018-05-04 ENCOUNTER — Ambulatory Visit (INDEPENDENT_AMBULATORY_CARE_PROVIDER_SITE_OTHER): Payer: PPO | Admitting: Family

## 2018-05-04 ENCOUNTER — Other Ambulatory Visit: Payer: Self-pay | Admitting: *Deleted

## 2018-05-04 VITALS — BP 153/92 | HR 82 | Temp 97.8°F | Ht 72.0 in | Wt 168.0 lb

## 2018-05-04 DIAGNOSIS — T8454XD Infection and inflammatory reaction due to internal left knee prosthesis, subsequent encounter: Secondary | ICD-10-CM | POA: Diagnosis not present

## 2018-05-04 DIAGNOSIS — Z452 Encounter for adjustment and management of vascular access device: Secondary | ICD-10-CM | POA: Diagnosis not present

## 2018-05-04 DIAGNOSIS — L089 Local infection of the skin and subcutaneous tissue, unspecified: Secondary | ICD-10-CM | POA: Diagnosis not present

## 2018-05-04 DIAGNOSIS — L511 Stevens-Johnson syndrome: Secondary | ICD-10-CM | POA: Diagnosis not present

## 2018-05-04 DIAGNOSIS — I10 Essential (primary) hypertension: Secondary | ICD-10-CM | POA: Diagnosis not present

## 2018-05-04 DIAGNOSIS — D649 Anemia, unspecified: Secondary | ICD-10-CM | POA: Diagnosis not present

## 2018-05-04 DIAGNOSIS — J449 Chronic obstructive pulmonary disease, unspecified: Secondary | ICD-10-CM | POA: Diagnosis not present

## 2018-05-04 DIAGNOSIS — E7800 Pure hypercholesterolemia, unspecified: Secondary | ICD-10-CM | POA: Diagnosis not present

## 2018-05-04 DIAGNOSIS — Z792 Long term (current) use of antibiotics: Secondary | ICD-10-CM | POA: Diagnosis not present

## 2018-05-04 DIAGNOSIS — Z5181 Encounter for therapeutic drug level monitoring: Secondary | ICD-10-CM | POA: Diagnosis not present

## 2018-05-04 DIAGNOSIS — I2510 Atherosclerotic heart disease of native coronary artery without angina pectoris: Secondary | ICD-10-CM | POA: Diagnosis not present

## 2018-05-04 DIAGNOSIS — G894 Chronic pain syndrome: Secondary | ICD-10-CM | POA: Diagnosis not present

## 2018-05-04 DIAGNOSIS — E119 Type 2 diabetes mellitus without complications: Secondary | ICD-10-CM | POA: Diagnosis not present

## 2018-05-04 DIAGNOSIS — Z87891 Personal history of nicotine dependence: Secondary | ICD-10-CM | POA: Diagnosis not present

## 2018-05-04 DIAGNOSIS — Z7982 Long term (current) use of aspirin: Secondary | ICD-10-CM | POA: Diagnosis not present

## 2018-05-04 NOTE — Patient Instructions (Signed)
Nice to see you.  We will obtain your records from [place] and determine what antibiotics you will need.   I will call you with the plan and the next steps.  Plan for follow up in 3 months or sooner if needed.

## 2018-05-04 NOTE — Progress Notes (Signed)
Patient signed release of medical records form.  Spoke with Stanton Kidney from [place] to confirm IV antibiotics. Patient is being treated with cefazolin 2 gr every 8 hours with end date 05/13/18

## 2018-05-04 NOTE — Progress Notes (Signed)
Subjective:    Patient ID: [name], male    DOB: [birth year], 83 y.o.   MRN: [id number]  Chief Complaint  Patient presents with  . Prosthetic Joint Infection     HPI:  Joseph [name] is a 83 y.o. male who presents today for a follow up office visit.   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 He was prescribed doxycycline at the time. Dr. Delfino Lovett had recommended a two-step exchange, however Joseph [name] decided to continue with antibiotic suppression at the time.  Joseph [name] step exchange which was completed on 3/81/01 without complication. Following surgery he has been receiving Cefazolin from [place] with end date of 05/13/18 and the plan to transition to oral antibiotics for 6 months at that time. Unfortunately there are no records for review at the time of this appointment. He was under the care of Dr. Raelene Bott, who was his ID provider in [place].  Joseph [name] has been receiving his medication as prescribed with no adverse side effects. Knee continues to feel improved since surgery. Denies fevers, chills, or sweats.    Allergies  Allergen Reactions  . Ciprofloxacin Other (See Comments)  . Ciprocin-Fluocin-Procin [Fluocinolone Acetonide] Other (See Comments)    Blisters  . Fluocinolone Acetonide Other (See Comments)  . Imiquimod Other (See Comments)  . Quinolones Other (See Comments)    Pt had to go to hospital  . Statins Other (See Comments)    Legs hurt  . Imiquimod Other (See Comments)    Blisters  . Penicillins Itching, Rash and Other (See Comments)    Has patient had a PCN reaction causing immediate rash, facial/tongue/throat swelling, SOB or lightheadedness with hypotension: Yes Has patient had a PCN reaction causing severe rash involving mucus membranes or skin necrosis: No Has  patient had a PCN reaction that required hospitalization: No Has patient had a PCN reaction occurring within the last 10 years: No If all of the above answers are "NO", then may proceed with Cephalosporin use.       Outpatient Medications Prior to Visit  Medication Sig Dispense Refill  . albuterol (PROVENTIL HFA;VENTOLIN HFA) 108 (90 Base) MCG/ACT inhaler Inhale 1 puff into the lungs every 6 (six) hours as needed for wheezing or shortness of breath.     Marland Kitchen amLODipine (NORVASC) 5 MG tablet Take 1 tablet (5 mg total) by mouth daily. 90 tablet 1  . aspirin 81 MG chewable tablet Chew 81 mg by mouth daily.    . diphenhydrAMINE (BENADRYL) 25 MG tablet Take 2 tablets (50 mg total) by mouth every 4 (four) hours as needed (swelling and itching). 20 tablet 0  . docusate sodium (COLACE) 100 MG capsule Take 1 capsule (100 mg total) by mouth 2 (two) times daily. 40 capsule 1  . Dutasteride-Tamsulosin HCl (JALYN) 0.5-0.4 MG CAPS Take 1 capsule by mouth daily.     . famotidine (PEPCID) 20 MG tablet famotidine 20 mg tablet   20 mg by oral route.    . hydrochlorothiazide (,MICROZIDE/HYDRODIURIL,) 12.5 MG capsule TAKE ONE CAPSULE BY MOUTH EVERY DAY 90 capsule 4  . ibuprofen (ADVIL,MOTRIN) 800 MG tablet Take 1 tablet (800 mg total) by mouth 3 (three) times daily as needed for mild pain (not relieved by acetaminophen). 30 tablet 0  . levothyroxine (SYNTHROID) 125 MCG tablet Take 125 mcg  by mouth daily.     Marland Kitchen lovastatin (MEVACOR) 40 MG tablet     . magnesium oxide (MAG-OX) 400 MG tablet Take 400 mg by mouth daily.    Marland Kitchen omeprazole (PRILOSEC) 20 MG capsule Take 20 mg by mouth daily.    . polyethylene glycol (MIRALAX / GLYCOLAX) packet Take 17 g by mouth daily as needed for mild constipation. 14 each 0  . Tiotropium Bromide Monohydrate (SPIRIVA RESPIMAT) 2.5 MCG/ACT AERS Inhale 1 puff into the lungs daily.     No facility-administered medications prior to visit.      Past Medical History:  Diagnosis Date  .  Arthritis   . COPD (chronic obstructive pulmonary disease) ([place])   . Dyspnea    on exertion  . Hyperlipidemia   . Hypertension   . Pneumothorax, spontaneous, tension 1960  . SOB (shortness of breath) on exertion      Past Surgical History:  Procedure Laterality Date  . EYE SURGERY     bilateral cataract with lens implants  . LUNG SURGERY     40 years ago  . TOE SURGERY    . TOTAL KNEE ARTHROPLASTY Left 10/06/2017   Procedure: LEFT TOTAL KNEE ARTHROPLASTY;  Surgeon: Susa Day, MD;  Location: [facility];  Service: Orthopedics;  Laterality: Left;  Adductor Block       Review of Systems  Constitutional: Negative for chills and fever.  Respiratory: Negative for chest tightness and shortness of breath.   Cardiovascular: Negative for chest pain and palpitations.      Objective:    BP (!) 153/92   Pulse 82   Temp 97.8 F (36.6 C)   Ht 6' (1.829 m)   Wt 168 lb (76.2 kg)   BMI 22.78 kg/m  Nursing note and vital signs reviewed.  Physical Exam  Constitutional: He is oriented to person, place, and time. He appears well-developed and well-nourished. No distress.  Cardiovascular: Normal rate, regular rhythm, normal heart sounds and intact distal pulses.  Pulmonary/Chest: Effort normal and breath sounds normal.  Musculoskeletal:  Left knee appears with well healed surgical scar with steri-strips remaining in place. There is mild edema present compared to the contralateral side. Pulses and sensation are normal.   Neurological: He is alert and oriented to person, place, and time.  Skin: Skin is warm and dry.  Psychiatric: He has a normal mood and affect.       Assessment & Plan:   Problem List Items Addressed This Visit      Musculoskeletal and Integument   Infection of prosthetic left knee joint ([place]) - Primary    Joseph [name] continues to receive treatment for prosthetic joint infection of the left knee and is now status post surgical replacement. Unfortunately I do not  have records available at present with culture results or operative notes. He remains on Cefazolin at this time with no adverse side effects. Plan for treatment with Cefazolin through 05/13/18 and then change to oral medication for at least 6 months. Will obtain cultures and operative notes from his hospitalization for formalize oral antibiotic plan. Follow up in 3 months or sooner pending results.           I am having Joseph [name] maintain his levothyroxine, hydrochlorothiazide, amLODipine, diphenhydrAMINE, magnesium oxide, Dutasteride-Tamsulosin HCl, albuterol, omeprazole, Tiotropium Bromide Monohydrate, ibuprofen, docusate sodium, polyethylene glycol, aspirin, lovastatin, and famotidine.   Follow-up: Return in about 3 months (around 08/04/2018), or if symptoms worsen or fail to improve.   Terri Piedra,  MSN, FNP-C Nurse Practitioner [facility] for [place] Office phone: [phone] Pager: [phone] [facility] Main number: [phone]

## 2018-05-04 NOTE — Patient Outreach (Addendum)
[facility]) Care Management  05/04/2018  NAVARRE DIANA [birth year] [id number]   Transition of Care Referral   Referral Date: 05/03/18 Referral Source: [facility] Urgent Outreach for [facility] Date of Admission:  Diagnosis:  Date of Discharge:on 04/29/18 Facility: [employer] [facility] [place]: [facility]  Outreach attempt # 1 Successful call to Mr Kotch  Patient is able to verify HIPAA Reviewed and addressed Transitional of care referral with patient CM made several attempts to engage him in conversation to completetd the entire transition of care assessment but he was noted to respond that he "think that I am okay." He reports not knowing if he got his snf d/c papers but able answer only some transition of care questions He did confirm a follow up appointment scheduled for 05/05/18 and that a home health agency was visiting. He denied medical concerns, issues with medications or DME or need for [facility] services  He did agree to be sent an Economist from [facility]. CM offered her number for contact but he would not write it down He thanked [facility] RN CM for calling and disconnected the line    Conditions: minimal tobacco history (10 pack years), history of arthritis, hyperlipidemia, mild COPD, HTN, GERD, constipation, hypothyroidism, DJD of left knee s/p left TKA, BPH, ED,  Medications: denies concerns with taking medications as prescribed, affording medications, side effects of medications and questions about medications  DME  walker  Consent: [facility] RN CM reviewed [facility] services with patient. Patient gave verbal consent for services. Advised patient that other post discharge calls may occur to assess how the patient is doing following the recent hospitalization. Patient voiced understanding and was appreciative of f/u call. He denies need of services from [facility] Community/Telephonic RN CM, pharmacy, health coach, NP or SW at this time  Plan: [place] CM will close  case at this time as patient has been assessed and no needs identified.   [facility] RN CM sent a successful outreach letter as discussed with [facility] brochure enclosed for review  Pt encouraged to return a call to [facility] RN CM prn   L. Lavina Hamman, RN, BSN, [place] Management Care Coordinator Direct Number [phone] Mobile number [phone]  Main [facility] number [phone] Fax number [phone]

## 2018-05-05 ENCOUNTER — Encounter: Payer: Self-pay | Admitting: Family

## 2018-05-05 NOTE — Assessment & Plan Note (Signed)
Joseph [name] continues to receive treatment for prosthetic joint infection of the left knee and is now status post surgical replacement. Unfortunately I do not have records available at present with culture results or operative notes. He remains on Cefazolin at this time with no adverse side effects. Plan for treatment with Cefazolin through 05/13/18 and then change to oral medication for at least 6 months. Will obtain cultures and operative notes from his hospitalization for formalize oral antibiotic plan. Follow up in 3 months or sooner pending results.

## 2018-05-06 ENCOUNTER — Telehealth: Payer: Self-pay | Admitting: Family

## 2018-05-06 DIAGNOSIS — Z96652 Presence of left artificial knee joint: Secondary | ICD-10-CM | POA: Diagnosis not present

## 2018-05-06 DIAGNOSIS — M13862 Other specified arthritis, left knee: Secondary | ICD-10-CM | POA: Diagnosis not present

## 2018-05-06 MED ORDER — SULFAMETHOXAZOLE-TRIMETHOPRIM 800-160 MG PO TABS: 1.0000 | ORAL_TABLET | Freq: Two times a day (BID) | ORAL | 5 refills | Status: DC

## 2018-05-06 MED ORDER — RIFAMPIN 300 MG PO CAPS: 600.0000 mg | ORAL_CAPSULE | Freq: Every day | ORAL | 5 refills | Status: DC

## 2018-05-06 NOTE — Telephone Encounter (Signed)
Received the results from [facility] and spoke with Joseph [name] regarding the next steps in the plan of care. He will be completing his IV antibiotic therapy with Cefazolin and Rifampin on 11/8. Cultures were positive for Staphylococcus Lugdenensis which was oxacillin susceptible. Will continue current dose of rifampin and start Bactrim twice daily for 6 months at the completion of IV antibiotics. All questions answered. Plan for follow up in 2 months for drug monitoring.

## 2018-05-09 ENCOUNTER — Telehealth: Payer: Self-pay | Admitting: *Deleted

## 2018-05-09 DIAGNOSIS — Z452 Encounter for adjustment and management of vascular access device: Secondary | ICD-10-CM | POA: Diagnosis not present

## 2018-05-09 DIAGNOSIS — L089 Local infection of the skin and subcutaneous tissue, unspecified: Secondary | ICD-10-CM | POA: Diagnosis not present

## 2018-05-09 DIAGNOSIS — E119 Type 2 diabetes mellitus without complications: Secondary | ICD-10-CM | POA: Diagnosis not present

## 2018-05-09 NOTE — Telephone Encounter (Signed)
Patient wanted to confirm that home health will be pulling his PICC after completion of IV antibiotics 11/8. RN confirmed with Stanton Kidney at [facility].  He has medication to start the next day - continuing rifampin 2 tablets daily (600mg  total daily) and bactrim DS twice daily, will be taking this combination for 6 months. He wanted to confirm this regimen with Marya Amsler, wanted to know if he should have any lab work. Landis Gandy, RN

## 2018-05-10 NOTE — Telephone Encounter (Signed)
Yes, this is appropriate. Please have him make an appointment for 1 month with Cassie for lab work.

## 2018-05-11 NOTE — Telephone Encounter (Signed)
Spoke to patient, scheduled him Thursday 12/5 at 2:30 with Cassie. Thanks!

## 2018-05-30 DIAGNOSIS — I10 Essential (primary) hypertension: Secondary | ICD-10-CM | POA: Diagnosis not present

## 2018-05-30 DIAGNOSIS — E7800 Pure hypercholesterolemia, unspecified: Secondary | ICD-10-CM | POA: Diagnosis not present

## 2018-05-30 DIAGNOSIS — R0602 Shortness of breath: Secondary | ICD-10-CM | POA: Diagnosis not present

## 2018-05-30 DIAGNOSIS — Z0189 Encounter for other specified special examinations: Secondary | ICD-10-CM | POA: Diagnosis not present

## 2018-06-06 DIAGNOSIS — M25562 Pain in left knee: Secondary | ICD-10-CM | POA: Diagnosis not present

## 2018-06-09 ENCOUNTER — Ambulatory Visit (INDEPENDENT_AMBULATORY_CARE_PROVIDER_SITE_OTHER): Payer: PPO | Admitting: Pharmacist

## 2018-06-09 DIAGNOSIS — Z5181 Encounter for therapeutic drug level monitoring: Secondary | ICD-10-CM | POA: Diagnosis not present

## 2018-06-09 DIAGNOSIS — T8454XD Infection and inflammatory reaction due to internal left knee prosthesis, subsequent encounter: Secondary | ICD-10-CM | POA: Diagnosis not present

## 2018-06-09 NOTE — Progress Notes (Signed)
HPI: Joseph [name] is a 83 y.o. male who presents to the [facility] for medication monitoring and labs on Bactrim.  Patient Active Problem List   Diagnosis Date Noted  . Infection of prosthetic left knee joint ([place]) 02/17/2018  . Stiffness of left knee 10/26/2017  . Essential hypertension, benign 10/13/2017  . Hypothyroidism due to acquired atrophy of thyroid 10/13/2017  . GERD without esophagitis 10/13/2017  . BPH with obstruction/lower urinary tract symptoms 10/13/2017  . Chronic constipation 10/13/2017  . S/P total knee arthroplasty, left 10/12/2017  . Left knee DJD 10/06/2017  . Lung nodule 01/09/2016  . COPD (chronic obstructive pulmonary disease) ([place]) 01/09/2016  . ED (erectile dysfunction) of organic origin 02/15/2012  . Elevated prostate specific antigen (PSA) 02/15/2012  . Hyperlipidemia   . Pneumothorax, spontaneous, tension   . SOB (shortness of breath) on exertion     Patient's Medications  New Prescriptions   No medications on file  Previous Medications   ALBUTEROL (PROVENTIL HFA;VENTOLIN HFA) 108 (90 BASE) MCG/ACT INHALER    Inhale 1 puff into the lungs every 6 (six) hours as needed for wheezing or shortness of breath.    AMLODIPINE (NORVASC) 5 MG TABLET    Take 1 tablet (5 mg total) by mouth daily.   ASPIRIN 81 MG CHEWABLE TABLET    Chew 81 mg by mouth daily.   DIPHENHYDRAMINE (BENADRYL) 25 MG TABLET    Take 2 tablets (50 mg total) by mouth every 4 (four) hours as needed (swelling and itching).   DOCUSATE SODIUM (COLACE) 100 MG CAPSULE    Take 1 capsule (100 mg total) by mouth 2 (two) times daily.   DUTASTERIDE-TAMSULOSIN HCL (JALYN) 0.5-0.4 MG CAPS    Take 1 capsule by mouth daily.    FAMOTIDINE (PEPCID) 20 MG TABLET    famotidine 20 mg tablet   20 mg by oral route.   HYDROCHLOROTHIAZIDE (,MICROZIDE/HYDRODIURIL,) 12.5 MG CAPSULE    TAKE ONE CAPSULE BY MOUTH EVERY DAY   IBUPROFEN (ADVIL,MOTRIN) 800 MG TABLET    Take 1 tablet (800 mg total) by mouth  3 (three) times daily as needed for mild pain (not relieved by acetaminophen).   LEVOTHYROXINE (SYNTHROID) 125 MCG TABLET    Take 125 mcg by mouth daily.    LOVASTATIN (MEVACOR) 40 MG TABLET       MAGNESIUM OXIDE (MAG-OX) 400 MG TABLET    Take 400 mg by mouth daily.   OMEPRAZOLE (PRILOSEC) 20 MG CAPSULE    Take 20 mg by mouth daily.   POLYETHYLENE GLYCOL (MIRALAX / GLYCOLAX) PACKET    Take 17 g by mouth daily as needed for mild constipation.   RIFAMPIN (RIFADIN) 300 MG CAPSULE    Take 2 capsules (600 mg total) by mouth daily.   SULFAMETHOXAZOLE-TRIMETHOPRIM (BACTRIM DS,SEPTRA DS) 800-160 MG TABLET    Take 1 tablet by mouth 2 (two) times daily.   TIOTROPIUM BROMIDE MONOHYDRATE (SPIRIVA RESPIMAT) 2.5 MCG/ACT AERS    Inhale 1 puff into the lungs daily.  Modified Medications   No medications on file  Discontinued Medications   No medications on file    Allergies: Allergies  Allergen Reactions  . Ciprofloxacin Other (See Comments)  . Ciprocin-Fluocin-Procin [Fluocinolone Acetonide] Other (See Comments)    Blisters  . Fluocinolone Acetonide Other (See Comments)  . Imiquimod Other (See Comments)  . Quinolones Other (See Comments)    Pt had to go to hospital  . Statins Other (See Comments)    Legs hurt  .  Imiquimod Other (See Comments)    Blisters  . Penicillins Itching, Rash and Other (See Comments)    Has patient had a PCN reaction causing immediate rash, facial/tongue/throat swelling, SOB or lightheadedness with hypotension: Yes Has patient had a PCN reaction causing severe rash involving mucus membranes or skin necrosis: No Has patient had a PCN reaction that required hospitalization: No Has patient had a PCN reaction occurring within the last 10 years: No If all of the above answers are "NO", then may proceed with Cephalosporin use.     Past Medical History: Past Medical History:  Diagnosis Date  . Arthritis   . COPD (chronic obstructive pulmonary disease) ([place])   . Dyspnea      on exertion  . Hyperlipidemia   . Hypertension   . Pneumothorax, spontaneous, tension 1960  . SOB (shortness of breath) on exertion     Social History: Social History   Socioeconomic History  . Marital status: Divorced    Spouse name: Not on file  . Number of children: 0  . Years of education: Not on file  . Highest education level: Not on file  Occupational History  . Occupation: attorney  Social Needs  . Financial resource strain: Not on file  . Food insecurity:    Worry: Not on file    Inability: Not on file  . Transportation needs:    Medical: Not on file    Non-medical: Not on file  Tobacco Use  . Smoking status: Former Smoker    Packs/day: 1.00    Years: 5.00    Pack years: 5.00    Types: Cigarettes    Last attempt to quit: 11/24/1958    Years since quitting: 59.5  . Smokeless tobacco: Never Used  Substance and Sexual Activity  . Alcohol use: No  . Drug use: No  . Sexual activity: Not on file  Lifestyle  . Physical activity:    Days per week: Not on file    Minutes per session: Not on file  . Stress: Not on file  Relationships  . Social connections:    Talks on phone: Not on file    Gets together: Not on file    Attends religious service: Not on file    Active member of club or organization: Not on file    Attends meetings of clubs or organizations: Not on file    Relationship status: Not on file  Other Topics Concern  . Not on file  Social History Narrative  . Not on file    RPR and STI Lab Results  Component Value Date   LABRPR NON REACTIVE 08/16/2008    No flowsheet data found.  Hepatitis B No results found for: HEPBSAB, HEPBSAG, HEPBCAB Hepatitis C No results found for: HEPCAB, HCVRNAPCRQN Hepatitis A No results found for: HAV Lipids: No results found for: CHOL, TRIG, HDL, CHOLHDL, VLDL, LDLCALC   Assessment: Joseph [name] is here today for medication monitoring on Bactrim. He was previously taking Ancef + rifampin x 6 weeks for his  left knee septic arthritis and prosthetic joint infection. He transitioned to Bactrim BID and continued rifampin 4 weeks ago. He states he is tolerating the regimen very well with no nausea, vomiting, diarrhea, decreased urine, or any issues that he notices.  He hasn't missed any doses and knows that he take one white pill twice daily and two red pills once daily.  Reminded him again that rifampin can make his urine/tears/sweat turn an orange/red color. Will check a  CBC and CMET today to monitor his kidneys and blood counts.  Patient requested that [place] fax his results to Dr. Flossie Dibble his orthopedist on [facility] and to Dr. Odetta Pink, his surgeon in [place].  Plan: - Continue Bactrim DS 1 tablet PO BID - Continue Rifampin 600 mg PO daily - CBC and CMET today - F/u with Marya Amsler 1/30 at 230pm  Sherel Fennell L. Hassani Sliney, PharmD, BCIDP, AAHIVP, [facility] 06/10/2018, 2:47 PM

## 2018-06-10 LAB — COMPREHENSIVE METABOLIC PANEL
AG Ratio: 1.5 (calc) (ref 1.0–2.5)
ALBUMIN MSPROF: 4.3 g/dL (ref 3.6–5.1)
ALT: 9 U/L (ref 9–46)
AST: 17 U/L (ref 10–35)
Alkaline phosphatase (APISO): 123 U/L — ABNORMAL HIGH (ref 40–115)
BUN: 13 mg/dL (ref 7–25)
CO2: 24 mmol/L (ref 20–32)
CREATININE: 1.01 mg/dL (ref 0.70–1.11)
Calcium: 9.5 mg/dL (ref 8.6–10.3)
Chloride: 106 mmol/L (ref 98–110)
GLOBULIN: 2.8 g/dL (ref 1.9–3.7)
Glucose, Bld: 105 mg/dL — ABNORMAL HIGH (ref 65–99)
POTASSIUM: 4 mmol/L (ref 3.5–5.3)
SODIUM: 139 mmol/L (ref 135–146)
TOTAL PROTEIN: 7.1 g/dL (ref 6.1–8.1)
Total Bilirubin: 0.3 mg/dL (ref 0.2–1.2)

## 2018-06-10 LAB — CBC
HEMATOCRIT: 44.4 % (ref 38.5–50.0)
Hemoglobin: 14.6 g/dL (ref 13.2–17.1)
MCH: 28.3 pg (ref 27.0–33.0)
MCHC: 32.9 g/dL (ref 32.0–36.0)
MCV: 86 fL (ref 80.0–100.0)
MPV: 11.3 fL (ref 7.5–12.5)
Platelets: 149 10*3/uL (ref 140–400)
RBC: 5.16 10*6/uL (ref 4.20–5.80)
RDW: 15.8 % — AB (ref 11.0–15.0)
WBC: 6.1 10*3/uL (ref 3.8–10.8)

## 2018-06-10 NOTE — Progress Notes (Signed)
FYI

## 2018-07-18 DIAGNOSIS — M25562 Pain in left knee: Secondary | ICD-10-CM | POA: Diagnosis not present

## 2018-07-29 DIAGNOSIS — R350 Frequency of micturition: Secondary | ICD-10-CM | POA: Diagnosis not present

## 2018-07-29 DIAGNOSIS — N390 Urinary tract infection, site not specified: Secondary | ICD-10-CM | POA: Diagnosis not present

## 2018-07-29 DIAGNOSIS — N3001 Acute cystitis with hematuria: Secondary | ICD-10-CM | POA: Diagnosis not present

## 2018-07-29 DIAGNOSIS — R399 Unspecified symptoms and signs involving the genitourinary system: Secondary | ICD-10-CM | POA: Diagnosis not present

## 2018-08-04 ENCOUNTER — Encounter: Payer: Self-pay | Admitting: Family

## 2018-08-04 ENCOUNTER — Ambulatory Visit (INDEPENDENT_AMBULATORY_CARE_PROVIDER_SITE_OTHER): Payer: PPO | Admitting: Family

## 2018-08-04 VITALS — BP 147/89 | HR 77 | Temp 97.4°F | Ht 72.0 in | Wt 185.5 lb

## 2018-08-04 DIAGNOSIS — Z7982 Long term (current) use of aspirin: Secondary | ICD-10-CM

## 2018-08-04 DIAGNOSIS — Z79899 Other long term (current) drug therapy: Secondary | ICD-10-CM | POA: Diagnosis not present

## 2018-08-04 DIAGNOSIS — Z79890 Hormone replacement therapy (postmenopausal): Secondary | ICD-10-CM

## 2018-08-04 DIAGNOSIS — T8454XD Infection and inflammatory reaction due to internal left knee prosthesis, subsequent encounter: Secondary | ICD-10-CM

## 2018-08-04 DIAGNOSIS — Z96652 Presence of left artificial knee joint: Secondary | ICD-10-CM

## 2018-08-04 DIAGNOSIS — Z792 Long term (current) use of antibiotics: Secondary | ICD-10-CM

## 2018-08-04 NOTE — Progress Notes (Signed)
Subjective:    Patient ID: Joseph [name], male    DOB: 1935/03/31, 84 y.o.   MRN: [id number]  Chief Complaint  Patient presents with  . Prosthetic Joint Infection     HPI:  Joseph [name] is a 84 y.o. male who presents today for routine office follow up.  Joseph [name] was last seen in the office on 05/04/18 for follow up of infection of left prosthetic knee joint s/p replacement in Joseph [name]. He was treated with 6 weeks of Cefazolin and transitioned to Bactrim and rifampin for the remaining 6 months of therapy with approximate end date of May 8th. In the interim he has been seen by our pharmacy staff with blood work showing normal kidney function, liver function and white/red blood cells.   Mr. Arnott continues to take his Bactrim and Rifampin as prescribed with no adverse side effects or missed doses. He has no current pain but does continue to have waxing and waning edema at times. No fevers, chills, sweats, nausea, vomiting or diarrhea.    Allergies  Allergen Reactions  . Ciprofloxacin Other (See Comments)  . Ciprocin-Fluocin-Procin [Fluocinolone Acetonide] Other (See Comments)    Blisters  . Fluocinolone Acetonide Other (See Comments)  . Imiquimod Other (See Comments)  . Quinolones Other (See Comments)    Pt had to go to hospital  . Statins Other (See Comments)    Legs hurt  . Imiquimod Other (See Comments)    Blisters  . Penicillins Itching, Rash and Other (See Comments)    Has patient had a PCN reaction causing immediate rash, facial/tongue/throat swelling, SOB or lightheadedness with hypotension: Yes Has patient had a PCN reaction causing severe rash involving mucus membranes or skin necrosis: No Has patient had a PCN reaction that required hospitalization: No Has patient had a PCN reaction occurring within the last 10 years: No If all of the above answers are "NO", then may proceed with Cephalosporin use.       Outpatient Medications Prior to Visit    Medication Sig Dispense Refill  . amLODipine (NORVASC) 5 MG tablet Take 1 tablet (5 mg total) by mouth daily. 90 tablet 1  . Dutasteride-Tamsulosin HCl (JALYN) 0.5-0.4 MG CAPS Take 1 capsule by mouth daily.     . famotidine (PEPCID) 20 MG tablet famotidine 20 mg tablet   20 mg by oral route.    . hydrochlorothiazide (,MICROZIDE/HYDRODIURIL,) 12.5 MG capsule TAKE ONE CAPSULE BY MOUTH EVERY DAY 90 capsule 4  . levothyroxine (SYNTHROID) 125 MCG tablet Take 125 mcg by mouth daily.     . magnesium oxide (MAG-OX) 400 MG tablet Take 400 mg by mouth daily.    . rifampin (RIFADIN) 300 MG capsule Take 2 capsules (600 mg total) by mouth daily. 60 capsule 5  . sulfamethoxazole-trimethoprim (BACTRIM DS,SEPTRA DS) 800-160 MG tablet Take 1 tablet by mouth 2 (two) times daily. 60 tablet 5  . Tiotropium Bromide Monohydrate (SPIRIVA RESPIMAT) 2.5 MCG/ACT AERS Inhale 1 puff into the lungs daily.    Marland Kitchen albuterol (PROVENTIL HFA;VENTOLIN HFA) 108 (90 Base) MCG/ACT inhaler Inhale 1 puff into the lungs every 6 (six) hours as needed for wheezing or shortness of breath.     Marland Kitchen aspirin 81 MG chewable tablet Chew 81 mg by mouth daily.    . diphenhydrAMINE (BENADRYL) 25 MG tablet Take 2 tablets (50 mg total) by mouth every 4 (four) hours as needed (swelling and itching). (Patient not taking: Reported on 08/04/2018) 20 tablet 0  . docusate  sodium (COLACE) 100 MG capsule Take 1 capsule (100 mg total) by mouth 2 (two) times daily. (Patient not taking: Reported on 08/04/2018) 40 capsule 1  . ibuprofen (ADVIL,MOTRIN) 800 MG tablet Take 1 tablet (800 mg total) by mouth 3 (three) times daily as needed for mild pain (not relieved by acetaminophen). (Patient not taking: Reported on 08/04/2018) 30 tablet 0  . lovastatin (MEVACOR) 40 MG tablet     . omeprazole (PRILOSEC) 20 MG capsule Take 20 mg by mouth daily.    . polyethylene glycol (MIRALAX / GLYCOLAX) packet Take 17 g by mouth daily as needed for mild constipation. (Patient not taking:  Reported on 08/04/2018) 14 each 0   No facility-administered medications prior to visit.      Past Medical History:  Diagnosis Date  . Arthritis   . COPD (chronic obstructive pulmonary disease) ([place])   . Dyspnea    on exertion  . Hyperlipidemia   . Hypertension   . Pneumothorax, spontaneous, tension 1960  . SOB (shortness of breath) on exertion      Past Surgical History:  Procedure Laterality Date  . EYE SURGERY     bilateral cataract with lens implants  . LUNG SURGERY     40 years ago  . TOE SURGERY    . TOTAL KNEE ARTHROPLASTY Left 10/06/2017   Procedure: LEFT TOTAL KNEE ARTHROPLASTY;  Surgeon: Susa Day, MD;  Location: [facility];  Service: Orthopedics;  Laterality: Left;  Adductor Block     Review of Systems  Constitutional: Negative for chills and fever.  Respiratory: Negative for cough, chest tightness and shortness of breath.   Cardiovascular: Negative for chest pain and leg swelling.  Gastrointestinal: Negative for abdominal pain, constipation, diarrhea, nausea and vomiting.  Neurological: Negative for dizziness, weakness and headaches.      Objective:    BP (!) 147/89   Pulse 77   Temp (!) 97.4 F (36.3 C) (Oral)   Ht 6' (1.829 m)   Wt 185 lb 8 oz (84.1 kg)   BMI 25.16 kg/m  Nursing note and vital signs reviewed.  Physical Exam Constitutional:      General: He is not in acute distress.    Appearance: He is well-developed.  Cardiovascular:     Rate and Rhythm: Normal rate and regular rhythm.     Heart sounds: Normal heart sounds.  Pulmonary:     Effort: Pulmonary effort is normal.     Breath sounds: Normal breath sounds.  Musculoskeletal:     Comments: Mild edema with no redness. Surgical incision sites appear well healed with no open areas or drainage noted.   Skin:    General: Skin is warm and dry.  Neurological:     Mental Status: He is alert and oriented to person, place, and time.  Psychiatric:        Behavior: Behavior normal.         Thought Content: Thought content normal.        Judgment: Judgment normal.        Assessment & Plan:   Problem List Items Addressed This Visit      Musculoskeletal and Integument   Infection of prosthetic left knee joint ([place]) - Primary    Mr. Tribbett continues to receive treatment for left knee prosthetic joint infection s/p exchange. He has completed 6 weeks of IV therapy with Cefazolin and continues to receive Bactrim and Rifampin for 6 months with end date planned for 11/11/18. Kidney function, liver function and  electrolytes look good. Continue current dose of Bactrim and Rifampin. Follow up in 3 months or sooner if needed.           I am having Elin L. Hinkson maintain his levothyroxine, hydrochlorothiazide, amLODipine, diphenhydrAMINE, magnesium oxide, Dutasteride-Tamsulosin HCl, albuterol, omeprazole, Tiotropium Bromide Monohydrate, ibuprofen, docusate sodium, polyethylene glycol, aspirin, lovastatin, famotidine, sulfamethoxazole-trimethoprim, and rifampin.   No orders of the defined types were placed in this encounter.    Follow-up: Return in about 3 months (around 11/03/2018), or if symptoms worsen or fail to improve.   Terri Piedra, MSN, FNP-C Nurse Practitioner [facility] Office phone: [phone] Pager: [place] number: [phone]

## 2018-08-04 NOTE — Assessment & Plan Note (Signed)
Joseph [name] continues to receive treatment for left knee prosthetic joint infection s/p exchange. He has completed 6 weeks of IV therapy with Cefazolin and continues to receive Bactrim and Rifampin for 6 months with end date planned for 11/11/18. Kidney function, liver function and electrolytes look good. Continue current dose of Bactrim and Rifampin. Follow up in 3 months or sooner if needed.

## 2018-08-04 NOTE — Patient Instructions (Signed)
Nice to see you.  Continue to take your Bactrim and Rifampin as prescribed.  We will plan to see you back in 3 months or sooner if needed.  Have a great day!

## 2018-08-11 DIAGNOSIS — I10 Essential (primary) hypertension: Secondary | ICD-10-CM | POA: Diagnosis not present

## 2018-08-11 DIAGNOSIS — K219 Gastro-esophageal reflux disease without esophagitis: Secondary | ICD-10-CM | POA: Diagnosis not present

## 2018-08-19 DIAGNOSIS — N528 Other male erectile dysfunction: Secondary | ICD-10-CM | POA: Diagnosis not present

## 2018-08-19 DIAGNOSIS — N400 Enlarged prostate without lower urinary tract symptoms: Secondary | ICD-10-CM | POA: Diagnosis not present

## 2018-08-19 DIAGNOSIS — R102 Pelvic and perineal pain: Secondary | ICD-10-CM | POA: Diagnosis not present

## 2018-08-19 DIAGNOSIS — R972 Elevated prostate specific antigen [PSA]: Secondary | ICD-10-CM | POA: Diagnosis not present

## 2018-08-19 DIAGNOSIS — G8929 Other chronic pain: Secondary | ICD-10-CM | POA: Diagnosis not present

## 2018-09-06 ENCOUNTER — Ambulatory Visit (INDEPENDENT_AMBULATORY_CARE_PROVIDER_SITE_OTHER): Payer: PPO | Admitting: Primary Care

## 2018-09-06 ENCOUNTER — Ambulatory Visit: Payer: PPO | Admitting: Emergency Medicine

## 2018-09-06 ENCOUNTER — Encounter: Payer: Self-pay | Admitting: Primary Care

## 2018-09-06 VITALS — BP 130/78 | HR 76 | Ht 72.0 in | Wt 186.0 lb

## 2018-09-06 DIAGNOSIS — J449 Chronic obstructive pulmonary disease, unspecified: Secondary | ICD-10-CM | POA: Diagnosis not present

## 2018-09-06 NOTE — Progress Notes (Signed)
@Patient  ID: Joseph [name], male    DOB: 01-08-1935, 84 y.o.   MRN: [id number]  Chief Complaint  Patient presents with  . Emphysema    6 month follow up    Referring provider: Deland Pretty, MD  HPI: 84 year old male, former smoker(10 pack year history). PMH significant for COPD, emphysema, pneumothorax. Patient of Dr. Lamonte Sakai, last seen on 03/24/19 for surgical clearance for left knee replacement. PFTs showed normal lung volumes,decreased diffusion capacity. =  Maintained Anoro.  09/06/2018 Patient presents today for regular office visit. He is doing well with no acute issues. Reports that his breathing has been fine. Continues taking Anoro as prescribed and reports benefit from use. He does not frequently require his rescue inhaler, max once a month. He had this second left knee replacement with no issues. Denies sob, wheezing or cough.    Allergies  Allergen Reactions  . Ciprofloxacin Other (See Comments)  . Ciprocin-Fluocin-Procin [Fluocinolone Acetonide] Other (See Comments)    Blisters  . Fluocinolone Acetonide Other (See Comments)  . Imiquimod Other (See Comments)  . Quinolones Other (See Comments)    Pt had to go to hospital  . Statins Other (See Comments)    Legs hurt  . Imiquimod Other (See Comments)    Blisters  . Penicillins Itching, Rash and Other (See Comments)    Has patient had a PCN reaction causing immediate rash, facial/tongue/throat swelling, SOB or lightheadedness with hypotension: Yes Has patient had a PCN reaction causing severe rash involving mucus membranes or skin necrosis: No Has patient had a PCN reaction that required hospitalization: No Has patient had a PCN reaction occurring within the last 10 years: No If all of the above answers are "NO", then may proceed with Cephalosporin use.     Immunization History  Administered Date(s) Administered  . Influenza, High Dose Seasonal PF 04/02/2017  . Influenza,inj,Quad PF,6+ Mos 03/09/2018  .  Influenza,inj,quad, With Preservative 02/03/2017  . Pneumococcal Conjugate-13 12/04/2016  . Pneumococcal-Unspecified 12/04/2016  . Tdap 11/26/2017    Past Medical History:  Diagnosis Date  . Arthritis   . COPD (chronic obstructive pulmonary disease) ([place])   . Dyspnea    on exertion  . Hyperlipidemia   . Hypertension   . Pneumothorax, spontaneous, tension 1960  . SOB (shortness of breath) on exertion     Tobacco History: Social History   Tobacco Use  Smoking Status Former Smoker  . Packs/day: 1.00  . Years: 5.00  . Pack years: 5.00  . Types: Cigarettes  . Last attempt to quit: 11/24/1958  . Years since quitting: 59.8  Smokeless Tobacco Never Used   Counseling given: Not Answered   Outpatient Medications Prior to Visit  Medication Sig Dispense Refill  . albuterol (PROVENTIL HFA;VENTOLIN HFA) 108 (90 Base) MCG/ACT inhaler Inhale 1 puff into the lungs every 6 (six) hours as needed for wheezing or shortness of breath.     Marland Kitchen amLODipine (NORVASC) 5 MG tablet Take 1 tablet (5 mg total) by mouth daily. 90 tablet 1  . aspirin 81 MG chewable tablet Chew 81 mg by mouth daily.    . Dutasteride-Tamsulosin HCl (JALYN) 0.5-0.4 MG CAPS Take 1 capsule by mouth daily.     . famotidine (PEPCID) 20 MG tablet famotidine 20 mg tablet   20 mg by oral route.    . hydrochlorothiazide (,MICROZIDE/HYDRODIURIL,) 12.5 MG capsule TAKE ONE CAPSULE BY MOUTH EVERY DAY 90 capsule 4  . levothyroxine (SYNTHROID) 125 MCG tablet Take 125 mcg by  mouth daily.     Marland Kitchen lovastatin (MEVACOR) 40 MG tablet     . magnesium oxide (MAG-OX) 400 MG tablet Take 400 mg by mouth daily.    Marland Kitchen omeprazole (PRILOSEC) 20 MG capsule Take 20 mg by mouth daily.    . rifampin (RIFADIN) 300 MG capsule Take 2 capsules (600 mg total) by mouth daily. 60 capsule 5  . sulfamethoxazole-trimethoprim (BACTRIM DS,SEPTRA DS) 800-160 MG tablet Take 1 tablet by mouth 2 (two) times daily. 60 tablet 5  . Tiotropium Bromide Monohydrate (SPIRIVA  RESPIMAT) 2.5 MCG/ACT AERS Inhale 1 puff into the lungs daily.    . diphenhydrAMINE (BENADRYL) 25 MG tablet Take 2 tablets (50 mg total) by mouth every 4 (four) hours as needed (swelling and itching). (Patient not taking: Reported on 08/04/2018) 20 tablet 0  . docusate sodium (COLACE) 100 MG capsule Take 1 capsule (100 mg total) by mouth 2 (two) times daily. (Patient not taking: Reported on 08/04/2018) 40 capsule 1  . ibuprofen (ADVIL,MOTRIN) 800 MG tablet Take 1 tablet (800 mg total) by mouth 3 (three) times daily as needed for mild pain (not relieved by acetaminophen). (Patient not taking: Reported on 08/04/2018) 30 tablet 0  . polyethylene glycol (MIRALAX / GLYCOLAX) packet Take 17 g by mouth daily as needed for mild constipation. (Patient not taking: Reported on 08/04/2018) 14 each 0   No facility-administered medications prior to visit.     Review of Systems  Review of Systems  Constitutional: Negative.   HENT: Negative.   Respiratory: Negative for cough, shortness of breath and wheezing.   Cardiovascular: Negative.    Physical Exam  BP 130/78 (BP Location: Left Arm, Cuff Size: Normal)   Pulse 76   Ht 6' (1.829 m)   Wt 186 lb (84.4 kg)   SpO2 97%   BMI 25.23 kg/m  Physical Exam Constitutional:      General: He is not in acute distress.    Appearance: Normal appearance. He is not ill-appearing.  HENT:     Head: Normocephalic and atraumatic.  Cardiovascular:     Rate and Rhythm: Normal rate and regular rhythm.  Pulmonary:     Effort: Pulmonary effort is normal.     Breath sounds: Wheezing present. No rhonchi.     Comments: Insp wheeze RLL/RUL, left lung field clear Musculoskeletal: Normal range of motion.  Skin:    General: Skin is warm and dry.  Neurological:     General: No focal deficit present.     Mental Status: He is alert and oriented to person, place, and time. Mental status is at baseline.  Psychiatric:        Mood and Affect: Mood normal.        Behavior:  Behavior normal.        Thought Content: Thought content normal.        Judgment: Judgment normal.      Lab Results:  CBC    Component Value Date/Time   WBC 6.1 06/09/2018 1513   RBC 5.16 06/09/2018 1513   HGB 14.6 06/09/2018 1513   HCT 44.4 06/09/2018 1513   PLT 149 06/09/2018 1513   MCV 86.0 06/09/2018 1513   MCH 28.3 06/09/2018 1513   MCHC 32.9 06/09/2018 1513   RDW 15.8 (H) 06/09/2018 1513   LYMPHSABS 1.7 08/17/2008 1555   MONOABS 0.7 08/17/2008 1555   EOSABS 0.0 08/17/2008 1555   BASOSABS 0.0 08/17/2008 1555    BMET    Component Value Date/Time   NA  139 06/09/2018 1513   K 4.0 06/09/2018 1513   CL 106 06/09/2018 1513   CO2 24 06/09/2018 1513   GLUCOSE 105 (H) 06/09/2018 1513   BUN 13 06/09/2018 1513   CREATININE 1.01 06/09/2018 1513   CALCIUM 9.5 06/09/2018 1513   GFRNONAA >60 10/07/2017 0538   GFRAA >60 10/07/2017 0538    BNP No results found for: BNP  ProBNP No results found for: PROBNP  Imaging: No results found.   Assessment & Plan:   COPD (chronic obstructive pulmonary disease) (HCC) - Stable, no complaints  - Continue Anoro 1 puff daily, PRN albuterol hfa  - Patient declined AVS  - Follow up in 6 months with Dr. Lamonte Sakai, return sooner if you develop sob, wheezing or prod cough     Martyn Ehrich, NP 09/06/2018

## 2018-09-06 NOTE — Assessment & Plan Note (Addendum)
-   Stable, no complaints  - Continue Anoro 1 puff daily, PRN albuterol hfa  - Patient declined AVS  - Follow up in 6 months with Dr. Lamonte Sakai, return sooner if you develop sob, wheezing or prod cough

## 2018-09-06 NOTE — Patient Instructions (Addendum)
Continue Anoro 1 puff daily  Follow up in 6 months with Dr. Lamonte Sakai  Return if you develop sob or wheezing

## 2018-10-13 DIAGNOSIS — R0789 Other chest pain: Secondary | ICD-10-CM | POA: Diagnosis not present

## 2018-10-13 DIAGNOSIS — R079 Chest pain, unspecified: Secondary | ICD-10-CM | POA: Diagnosis not present

## 2018-10-13 DIAGNOSIS — R1310 Dysphagia, unspecified: Secondary | ICD-10-CM | POA: Diagnosis not present

## 2018-10-17 DIAGNOSIS — J449 Chronic obstructive pulmonary disease, unspecified: Secondary | ICD-10-CM | POA: Diagnosis not present

## 2018-10-17 DIAGNOSIS — K219 Gastro-esophageal reflux disease without esophagitis: Secondary | ICD-10-CM | POA: Diagnosis not present

## 2018-10-17 DIAGNOSIS — M25562 Pain in left knee: Secondary | ICD-10-CM | POA: Diagnosis not present

## 2018-10-17 DIAGNOSIS — R1310 Dysphagia, unspecified: Secondary | ICD-10-CM | POA: Diagnosis not present

## 2018-10-26 DIAGNOSIS — K222 Esophageal obstruction: Secondary | ICD-10-CM | POA: Diagnosis not present

## 2018-10-26 DIAGNOSIS — R1310 Dysphagia, unspecified: Secondary | ICD-10-CM | POA: Diagnosis not present

## 2018-10-26 DIAGNOSIS — K449 Diaphragmatic hernia without obstruction or gangrene: Secondary | ICD-10-CM | POA: Diagnosis not present

## 2018-10-27 ENCOUNTER — Encounter: Payer: Self-pay | Admitting: Family

## 2018-10-27 ENCOUNTER — Other Ambulatory Visit: Payer: Self-pay

## 2018-10-27 ENCOUNTER — Ambulatory Visit (INDEPENDENT_AMBULATORY_CARE_PROVIDER_SITE_OTHER): Payer: PPO | Admitting: Family

## 2018-10-27 DIAGNOSIS — T8454XD Infection and inflammatory reaction due to internal left knee prosthesis, subsequent encounter: Secondary | ICD-10-CM | POA: Diagnosis not present

## 2018-10-27 NOTE — Assessment & Plan Note (Signed)
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 He is tolerating and adherent to his regimen. Knee sounds to be doing very well. Continue current Bactrim and Rifampin until May 8th as planned. Encouraged to monitor for signs of infection after completion of antibiotics for pain, redness and edema. Follow up with ID in 1 month or sooner if needed.

## 2018-10-27 NOTE — Patient Instructions (Signed)
Nice to speak with you.  We will complete your antibiotics on 11/11/18.  Please continue to take your Rifampim and Bactrim as prescribed.  Plan for follow up in 1 month or sooner if needed following antibiotic completion.

## 2018-10-27 NOTE — Progress Notes (Signed)
Subjective:    Patient ID: Joseph [name], male    DOB: 1934/07/21, 84 y.o.   MRN: [id number]  Chief Complaint  Patient presents with  . Prosthetic Joint Infection     Virtual Visit via Telephone Note   I connected with Joseph [name] on 10/27/2018 at 2:30 PM by telephone and verified that I am speaking with the correct person using two identifiers.   I discussed the limitations, risks, security and privacy concerns of performing an evaluation and management service by telephone and the availability of in person appointments. I also discussed with the patient that there may be a patient responsible charge related to this service. The patient expressed understanding and agreed to proceed.   HPI:  Joseph [name] is a 84 y.o. 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  Mr. Joseph [name] has been taking his Bactrim and rifampin as prescribed with no adverse side effects or missed doses. His knee is doing well and there is minimal to no pain and he is able to complete his activities of daily living without problems. Denies fevers, chills, sweats, or localized edema.   Allergies  Allergen Reactions  . Ciprofloxacin Other (See Comments)  . Ciprocin-Fluocin-Procin [Fluocinolone Acetonide] Other (See Comments)    Blisters  . Fluocinolone Acetonide Other (See Comments)  . Imiquimod Other (See Comments)  . Quinolones Other (See Comments)    Pt had to go to hospital  . Statins Other (See Comments)    Legs hurt  . Imiquimod Other (See Comments)    Blisters  . Penicillins Itching, Rash and Other (See Comments)    Has patient had a PCN reaction causing immediate rash, facial/tongue/throat swelling, SOB or lightheadedness with hypotension: Yes Has patient had a PCN  reaction causing severe rash involving mucus membranes or skin necrosis: No Has patient had a PCN reaction that required hospitalization: No Has patient had a PCN reaction occurring within the last 10 years: No If all of the above answers are "NO", then may proceed with Cephalosporin use.       Outpatient Medications Prior to Visit  Medication Sig Dispense Refill  . albuterol (PROVENTIL HFA;VENTOLIN HFA) 108 (90 Base) MCG/ACT inhaler Inhale 1 puff into the lungs every 6 (six) hours as needed for wheezing or shortness of breath.     Marland Kitchen amLODipine (NORVASC) 5 MG tablet Take 1 tablet (5 mg total) by mouth daily. 90 tablet 1  . aspirin 81 MG chewable tablet Chew 81 mg by mouth daily.    . Dutasteride-Tamsulosin HCl (JALYN) 0.5-0.4 MG CAPS Take 1 capsule by mouth daily.     . famotidine (PEPCID) 20 MG tablet famotidine 20 mg tablet   20 mg by oral route.    . hydrochlorothiazide (,MICROZIDE/HYDRODIURIL,) 12.5 MG capsule TAKE ONE CAPSULE BY MOUTH EVERY DAY 90 capsule 4  . levothyroxine (SYNTHROID) 125 MCG tablet Take 125 mcg by mouth daily.     Marland Kitchen lovastatin (MEVACOR) 40 MG tablet     . magnesium oxide (MAG-OX) 400 MG tablet Take 400 mg by mouth daily.    Marland Kitchen omeprazole (PRILOSEC) 20 MG capsule Take 20 mg by mouth daily.    . rifampin (RIFADIN) 300 MG capsule Take 2 capsules (600 mg total) by mouth daily. 60 capsule 5  . sulfamethoxazole-trimethoprim (  BACTRIM DS,SEPTRA DS) 800-160 MG tablet Take 1 tablet by mouth 2 (two) times daily. 60 tablet 5  . Tiotropium Bromide Monohydrate (SPIRIVA RESPIMAT) 2.5 MCG/ACT AERS Inhale 1 puff into the lungs daily.     No facility-administered medications prior to visit.      Past Medical History:  Diagnosis Date  . Arthritis   . COPD (chronic obstructive pulmonary disease) ([place])   . Dyspnea    on exertion  . Hyperlipidemia   . Hypertension   . Pneumothorax, spontaneous, tension 1960  . SOB (shortness of breath) on exertion      Past Surgical  History:  Procedure Laterality Date  . EYE SURGERY     bilateral cataract with lens implants  . LUNG SURGERY     40 years ago  . TOE SURGERY    . TOTAL KNEE ARTHROPLASTY Left 10/06/2017   Procedure: LEFT TOTAL KNEE ARTHROPLASTY;  Surgeon: Susa Day, MD;  Location: [facility];  Service: Orthopedics;  Laterality: Left;  Adductor Block     Review of Systems  Constitutional: Negative for chills, fatigue and fever.  Respiratory: Negative for chest tightness, shortness of breath and wheezing.   Cardiovascular: Negative for chest pain.  Gastrointestinal: Negative for abdominal pain, constipation, diarrhea, nausea and vomiting.  Musculoskeletal: Negative for joint swelling.      Objective:    Nursing note and vital signs reviewed.    Joseph [name] is pleasant to speak with and sounds to be doing well.  Assessment & Plan:   Problem List Items Addressed This Visit      Musculoskeletal and Integument   Infection of prosthetic left knee joint ([place]) - Primary    Joseph [name] is nearing completion of 6 months of antibiotic therapy with Bactrim and Rifampin Staphylococcus lugdunensis prosthetic joint infection of the right knee s/p 1-step exchange. He is tolerating and adherent to his regimen. Knee sounds to be doing very well. Continue current Bactrim and Rifampin until May 8th as planned. Encouraged to monitor for signs of infection after completion of antibiotics for pain, redness and edema. Follow up with ID in 1 month or sooner if needed.           I am having Joseph [name] maintain his levothyroxine, hydrochlorothiazide, amLODipine, magnesium oxide, Dutasteride-Tamsulosin HCl, albuterol, omeprazole, Tiotropium Bromide Monohydrate, aspirin, lovastatin, famotidine, sulfamethoxazole-trimethoprim, and rifampin.   I discussed the assessment and treatment plan with the patient. The patient was provided an opportunity to ask questions and all were answered. The patient agreed with the plan  and demonstrated an understanding of the instructions.   The patient was advised to call back or seek an in-person evaluation if the symptoms worsen or if the condition fails to improve as anticipated.   I provided 8  minutes of non-face-to-face time during this encounter.  Follow-up: Return in about 1 month (around 11/26/2018), or if symptoms worsen or fail to improve.   Terri Piedra, MSN, FNP-C Nurse Practitioner [facility] [place] number: [phone]

## 2018-11-23 DIAGNOSIS — K222 Esophageal obstruction: Secondary | ICD-10-CM | POA: Diagnosis not present

## 2018-11-23 DIAGNOSIS — K449 Diaphragmatic hernia without obstruction or gangrene: Secondary | ICD-10-CM | POA: Diagnosis not present

## 2018-11-23 DIAGNOSIS — R1310 Dysphagia, unspecified: Secondary | ICD-10-CM | POA: Diagnosis not present

## 2018-11-24 ENCOUNTER — Ambulatory Visit (INDEPENDENT_AMBULATORY_CARE_PROVIDER_SITE_OTHER): Payer: PPO | Admitting: Family

## 2018-11-24 ENCOUNTER — Encounter: Payer: Self-pay | Admitting: Family

## 2018-11-24 ENCOUNTER — Other Ambulatory Visit: Payer: Self-pay

## 2018-11-24 DIAGNOSIS — T8454XD Infection and inflammatory reaction due to internal left knee prosthesis, subsequent encounter: Secondary | ICD-10-CM | POA: Diagnosis not present

## 2018-11-24 NOTE — Assessment & Plan Note (Signed)
Mr. Joseph [name] has completed 6 weeks of IV antibiotic therapy with cefazolin and rifampin and 6 months of oral antibiotic therapy with sulfamethoxazole/trimethoprim and rifampin for left prosthetic knee joint infection with Staphylococcus lugdunensis.  He is stable and doing well.  We discussed signs and symptoms to look out for in the future.  No further antibiotic therapy is necessary at this time.  Will discontinue sulfamethoxazole-trimethoprim and rifampin.  All questions answered.  Follow-up with ID as necessary.

## 2018-11-24 NOTE — Progress Notes (Signed)
Subjective:    Patient ID: Joseph [name], male    DOB: September 23, 1934, 84 y.o.   MRN: [id number]  Chief Complaint  Patient presents with  . Prosthetic Joint Infection     Virtual Visit via Telephone Note   I connected with [name] on 11/24/2018 at9:45 AM  by telephone and verified that I am speaking with the correct person using two identifiers.   I discussed the limitations, risks, security and privacy concerns of performing an evaluation and management service by telephone and the availability of in person appointments. I also discussed with the patient that there may be a patient responsible charge related to this service. The patient expressed understanding and agreed to proceed.   HPI:  Joseph [name] is a 84 y.o. male with recent history of Staphylococcus lugdunensis left prosthetic knee joint infection status post exchange on 04/01/2018 and completed 6 weeks of IV cefazolin with rifampin and transitioned to oral Bactrim and continued on rifampin for completion of 6 months of surgery with end date scheduled for 11/11/2018.  I last spoke with Mr. Joseph [name] on 10/27/2018 as he continued to receive his Bactrim and rifampin with good adherence and tolerance.  Mr. Joseph [name] completed his antibiotic therapy on 11/11/2018 with sulfamethoxazole/trimethoprim and rifampin with no adverse side effects or missed doses.  Overall feeling well today.  He does have mild amount of edema which he was told by orthopedics is outside the joint.  He is able to complete his activities of daily living without problem and ambulates without assistance.  Not currently having any pain today.  Allergies  Allergen Reactions  . Ciprofloxacin Other (See Comments)  . Ciprocin-Fluocin-Procin [Fluocinolone Acetonide] Other (See Comments)    Blisters  . Fluocinolone Acetonide Other (See Comments)  . Imiquimod Other (See Comments)  . Quinolones Other (See Comments)    Pt had to go to hospital  . Statins Other  (See Comments)    Legs hurt  . Imiquimod Other (See Comments)    Blisters  . Penicillins Itching, Rash and Other (See Comments)    Has patient had a PCN reaction causing immediate rash, facial/tongue/throat swelling, SOB or lightheadedness with hypotension: Yes Has patient had a PCN reaction causing severe rash involving mucus membranes or skin necrosis: No Has patient had a PCN reaction that required hospitalization: No Has patient had a PCN reaction occurring within the last 10 years: No If all of the above answers are "NO", then may proceed with Cephalosporin use.       Outpatient Medications Prior to Visit  Medication Sig Dispense Refill  . albuterol (PROVENTIL HFA;VENTOLIN HFA) 108 (90 Base) MCG/ACT inhaler Inhale 1 puff into the lungs every 6 (six) hours as needed for wheezing or shortness of breath.     Marland Kitchen amLODipine (NORVASC) 5 MG tablet Take 1 tablet (5 mg total) by mouth daily. 90 tablet 1  . aspirin 81 MG chewable tablet Chew 81 mg by mouth daily.    . Dutasteride-Tamsulosin HCl (JALYN) 0.5-0.4 MG CAPS Take 1 capsule by mouth daily.     . hydrochlorothiazide (,MICROZIDE/HYDRODIURIL,) 12.5 MG capsule TAKE ONE CAPSULE BY MOUTH EVERY DAY 90 capsule 4  . levothyroxine (SYNTHROID) 125 MCG tablet Take 125 mcg by mouth daily.     Marland Kitchen lovastatin (MEVACOR) 40 MG tablet     . magnesium oxide (MAG-OX) 400 MG tablet Take 400 mg by mouth daily.    Marland Kitchen omeprazole (PRILOSEC) 20 MG capsule Take 20 mg by mouth daily.    Marland Kitchen  famotidine (PEPCID) 20 MG tablet famotidine 20 mg tablet   20 mg by oral route.    . Tiotropium Bromide Monohydrate (SPIRIVA RESPIMAT) 2.5 MCG/ACT AERS Inhale 1 puff into the lungs daily.    . rifampin (RIFADIN) 300 MG capsule Take 2 capsules (600 mg total) by mouth daily. (Patient not taking: Reported on 11/24/2018) 60 capsule 5  . sulfamethoxazole-trimethoprim (BACTRIM DS,SEPTRA DS) 800-160 MG tablet Take 1 tablet by mouth 2 (two) times daily. (Patient not taking: Reported on  11/24/2018) 60 tablet 5   No facility-administered medications prior to visit.      Past Medical History:  Diagnosis Date  . Arthritis   . COPD (chronic obstructive pulmonary disease) ([place])   . Dyspnea    on exertion  . Hyperlipidemia   . Hypertension   . Pneumothorax, spontaneous, tension 1960  . SOB (shortness of breath) on exertion      Past Surgical History:  Procedure Laterality Date  . EYE SURGERY     bilateral cataract with lens implants  . LUNG SURGERY     40 years ago  . TOE SURGERY    . TOTAL KNEE ARTHROPLASTY Left 10/06/2017   Procedure: LEFT TOTAL KNEE ARTHROPLASTY;  Surgeon: Susa Day, MD;  Location: [facility];  Service: Orthopedics;  Laterality: Left;  Adductor Block       Review of Systems  Constitutional: Negative for chills, fatigue and fever.  Respiratory: Negative for chest tightness, shortness of breath and wheezing.   Cardiovascular: Negative for chest pain and leg swelling.  Skin: Negative for color change, rash and wound.      Objective:    Nursing note and vital signs reviewed.    Mr. Joseph [name] is pleasant to speak with and it sounds to be doing well.  No other physical exam available as this was a phone visit. Assessment & Plan:   Problem List Items Addressed This Visit      Musculoskeletal and Integument   Infection of prosthetic left knee joint ([place]) - Primary    Mr. Joseph [name] has completed 6 weeks of IV antibiotic therapy with cefazolin and rifampin and 6 months of oral antibiotic therapy with sulfamethoxazole/trimethoprim and rifampin for left prosthetic knee joint infection with Staphylococcus lugdunensis.  He is stable and doing well.  We discussed signs and symptoms to look out for in the future.  No further antibiotic therapy is necessary at this time.  Will discontinue sulfamethoxazole-trimethoprim and rifampin.  All questions answered.  Follow-up with ID as necessary.          I have discontinued Joseph [name]'s  sulfamethoxazole-trimethoprim and rifampin. I am also having him maintain his levothyroxine, hydrochlorothiazide, amLODipine, magnesium oxide, Dutasteride-Tamsulosin HCl, albuterol, omeprazole, Tiotropium Bromide Monohydrate, aspirin, lovastatin, and famotidine.   I discussed the assessment and treatment plan with the patient. The patient was provided an opportunity to ask questions and all were answered. The patient agreed with the plan and demonstrated an understanding of the instructions.   The patient was advised to call back or seek an in-person evaluation if the symptoms worsen or if the condition fails to improve as anticipated.   I provided  6 minutes on the phone minutes of non-face-to-face time during this encounter.  Follow-up: Return if symptoms worsen or fail to improve.   Terri Piedra, MSN, FNP-C Nurse Practitioner [facility] for Infectious Disease [place] number: [phone]

## 2018-11-24 NOTE — Patient Instructions (Signed)
Nice to speak with you.  I am glad to hear that your knee is doing well.  No further antibiotic therapy is necessary at this time.  Continue to watch out for swelling, redness, and increasing pain.  We are happy to see back as needed.  Have a great day and stay safe!

## 2018-12-23 DIAGNOSIS — E7800 Pure hypercholesterolemia, unspecified: Secondary | ICD-10-CM | POA: Diagnosis not present

## 2018-12-23 DIAGNOSIS — I10 Essential (primary) hypertension: Secondary | ICD-10-CM | POA: Diagnosis not present

## 2018-12-23 DIAGNOSIS — E039 Hypothyroidism, unspecified: Secondary | ICD-10-CM | POA: Diagnosis not present

## 2018-12-28 DIAGNOSIS — J449 Chronic obstructive pulmonary disease, unspecified: Secondary | ICD-10-CM | POA: Diagnosis not present

## 2018-12-28 DIAGNOSIS — Z7982 Long term (current) use of aspirin: Secondary | ICD-10-CM | POA: Diagnosis not present

## 2018-12-28 DIAGNOSIS — R7303 Prediabetes: Secondary | ICD-10-CM | POA: Diagnosis not present

## 2018-12-28 DIAGNOSIS — I10 Essential (primary) hypertension: Secondary | ICD-10-CM | POA: Diagnosis not present

## 2018-12-28 DIAGNOSIS — D696 Thrombocytopenia, unspecified: Secondary | ICD-10-CM | POA: Diagnosis not present

## 2018-12-28 DIAGNOSIS — Z0000 Encounter for general adult medical examination without abnormal findings: Secondary | ICD-10-CM | POA: Diagnosis not present

## 2018-12-28 DIAGNOSIS — R1310 Dysphagia, unspecified: Secondary | ICD-10-CM | POA: Diagnosis not present

## 2018-12-28 DIAGNOSIS — E039 Hypothyroidism, unspecified: Secondary | ICD-10-CM | POA: Diagnosis not present

## 2018-12-28 DIAGNOSIS — F5101 Primary insomnia: Secondary | ICD-10-CM | POA: Diagnosis not present

## 2018-12-28 DIAGNOSIS — N401 Enlarged prostate with lower urinary tract symptoms: Secondary | ICD-10-CM | POA: Diagnosis not present

## 2018-12-28 DIAGNOSIS — D1432 Benign neoplasm of left bronchus and lung: Secondary | ICD-10-CM | POA: Diagnosis not present

## 2018-12-28 DIAGNOSIS — I2510 Atherosclerotic heart disease of native coronary artery without angina pectoris: Secondary | ICD-10-CM | POA: Diagnosis not present

## 2019-01-16 DIAGNOSIS — M25562 Pain in left knee: Secondary | ICD-10-CM | POA: Diagnosis not present

## 2019-03-14 DIAGNOSIS — Z23 Encounter for immunization: Secondary | ICD-10-CM | POA: Diagnosis not present

## 2019-03-22 ENCOUNTER — Ambulatory Visit (INDEPENDENT_AMBULATORY_CARE_PROVIDER_SITE_OTHER): Payer: PPO | Admitting: Emergency Medicine

## 2019-03-22 ENCOUNTER — Other Ambulatory Visit: Payer: Self-pay

## 2019-03-22 ENCOUNTER — Encounter: Payer: Self-pay | Admitting: Emergency Medicine

## 2019-03-22 DIAGNOSIS — J432 Centrilobular emphysema: Secondary | ICD-10-CM | POA: Diagnosis not present

## 2019-03-22 NOTE — Patient Instructions (Signed)
Please continue Anoro once daily. Keep your albuterol available use 2 puffs if needed for shortness of breath, chest tightness, wheezing.  You might want to consider taking 2 puffs about 10 minutes before exertion, yardwork, to see if this makes your tasks easier. Flu shot and pneumonia shot are both up-to-date. Follow with Dr. Lamonte Sakai in 12 months or sooner if you have any problems.

## 2019-03-22 NOTE — Assessment & Plan Note (Signed)
Doing well, compensated on his current regimen.  Does have some exertional fatigue.  I talked to him today about possibly using albuterol a bit more liberally, possibly pretreating to see if he benefits.  Please continue Anoro once daily. Keep your albuterol available use 2 puffs if needed for shortness of breath, chest tightness, wheezing.  You might want to consider taking 2 puffs about 10 minutes before exertion, yardwork, to see if this makes your tasks easier. Flu shot and pneumonia shot are both up-to-date. Follow with Dr. Lamonte Sakai in 12 months or sooner if you have any problems.

## 2019-03-22 NOTE — Progress Notes (Signed)
Subjective:    Patient ID: Joseph [name], male    DOB: 07-10-1934, 84 y.o.   MRN: [id number]  HPI 84 year old gentleman with a minimal tobacco history (10 pack years), history of arthritis, hyperlipidemia.  He has been seen in our office before by Dr. Murlean Iba for mild COPD and an abnormal CT scan of the chest with an anterior left lung base nodule that was stable on serial scanning, negative on PET scan that was done 01/14/2016.   He is being evaluated for a repeat L knee replacement due to Staph.lugdenensis to be done in [place]. First operation was done 10/06/17. His repeat sgy is scheduled for 04/01/18.   Pulmonary function testing done on 03/11/2018 reviewed by me.  This shows grossly normal airflows with a decreased diffusion capacity that does not correct for his alveolar volume.  He has been treated with Spiriva for 1-2 years. Started to notice more SOB about a month ago, no real response to albuterol. No cough or wheeze. A trial by Dr Shelia Media changing to Trelegy was done, stopped soon after due to UA irritation. Then changed to Anoro, which he is using currently. He feels some benefit, is less dyspneic. He does still have UA irritation occasionally. He is unable to exert much due to his knee.   He has seen Dr Einar Gip prior to his April surgery, was cleared from cardiac standpoint at that time.   ROV 03/22/2019 --this follow-up visit for patient with a minimal tobacco history, mild COPD with obstruction noted on spirometry.  He is currently managed on Anoro, does feel that he benefits from it. He does have some exertional fatigue, suspect dyspnea, with heavier chores, yard work. Recovers w rest after just few minutes. Has albuterol, rarely uses. PNA shot and flu shot UTD. No flares, overall well.    Review of Systems  Constitutional: Negative for fever and unexpected weight change.  HENT: Negative for congestion, dental problem, ear pain, nosebleeds, postnasal drip, rhinorrhea, sinus pressure,  sneezing, sore throat and trouble swallowing.   Eyes: Negative for redness and itching.  Respiratory: Negative for cough, chest tightness, shortness of breath and wheezing.   Cardiovascular: Negative for palpitations and leg swelling.  Gastrointestinal: Negative for nausea and vomiting.  Genitourinary: Negative for dysuria.  Musculoskeletal: Negative for joint swelling.  Skin: Negative for rash.  Neurological: Negative for headaches.  Hematological: Does not bruise/bleed easily.  Psychiatric/Behavioral: Negative for dysphoric mood. The patient is not nervous/anxious.    Past Medical History:  Diagnosis Date  . Arthritis   . COPD (chronic obstructive pulmonary disease) ([place])   . Dyspnea    on exertion  . Hyperlipidemia   . Hypertension   . Pneumothorax, spontaneous, tension 1960  . SOB (shortness of breath) on exertion      No family history on file.   Social History   Socioeconomic History  . Marital status: Divorced    Spouse name: Not on file  . Number of children: 0  . Years of education: Not on file  . Highest education level: Not on file  Occupational History  . Occupation: attorney  Social Needs  . Financial resource strain: Not on file  . Food insecurity    Worry: Not on file    Inability: Not on file  . Transportation needs    Medical: Not on file    Non-medical: Not on file  Tobacco Use  . Smoking status: Former Smoker    Packs/day: 1.00    Years: 5.00  Pack years: 5.00    Types: Cigarettes    Quit date: 11/24/1958    Years since quitting: 60.3  . Smokeless tobacco: Never Used  Substance and Sexual Activity  . Alcohol use: No  . Drug use: No  . Sexual activity: Not on file  Lifestyle  . Physical activity    Days per week: Not on file    Minutes per session: Not on file  . Stress: Not on file  Relationships  . Social Herbalist on phone: Not on file    Gets together: Not on file    Attends religious service: Not on file    Active  member of club or organization: Not on file    Attends meetings of clubs or organizations: Not on file    Relationship status: Not on file  . Intimate partner violence    Fear of current or ex partner: Not on file    Emotionally abused: Not on file    Physically abused: Not on file    Forced sexual activity: Not on file  Other Topics Concern  . Not on file  Social History Narrative  . Not on file     Allergies  Allergen Reactions  . Ciprofloxacin Other (See Comments)  . Ciprocin-Fluocin-Procin [Fluocinolone Acetonide] Other (See Comments)    Blisters  . Fluocinolone Acetonide Other (See Comments)  . Imiquimod Other (See Comments)  . Quinolones Other (See Comments)    Pt had to go to hospital  . Statins Other (See Comments)    Legs hurt  . Imiquimod Other (See Comments)    Blisters  . Penicillins Itching, Rash and Other (See Comments)    Has patient had a PCN reaction causing immediate rash, facial/tongue/throat swelling, SOB or lightheadedness with hypotension: Yes Has patient had a PCN reaction causing severe rash involving mucus membranes or skin necrosis: No Has patient had a PCN reaction that required hospitalization: No Has patient had a PCN reaction occurring within the last 10 years: No If all of the above answers are "NO", then may proceed with Cephalosporin use.      Outpatient Medications Prior to Visit  Medication Sig Dispense Refill  . albuterol (PROVENTIL HFA;VENTOLIN HFA) 108 (90 Base) MCG/ACT inhaler Inhale 1 puff into the lungs every 6 (six) hours as needed for wheezing or shortness of breath.     Marland Kitchen amLODipine (NORVASC) 5 MG tablet Take 1 tablet (5 mg total) by mouth daily. 90 tablet 1  . aspirin 81 MG chewable tablet Chew 81 mg by mouth daily.    . Dutasteride-Tamsulosin HCl (JALYN) 0.5-0.4 MG CAPS Take 1 capsule by mouth daily.     . famotidine (PEPCID) 20 MG tablet famotidine 20 mg tablet   20 mg by oral route.    . hydrochlorothiazide  (,MICROZIDE/HYDRODIURIL,) 12.5 MG capsule TAKE ONE CAPSULE BY MOUTH EVERY DAY 90 capsule 4  . levothyroxine (SYNTHROID) 125 MCG tablet Take 125 mcg by mouth daily.     Marland Kitchen lovastatin (MEVACOR) 40 MG tablet     . magnesium oxide (MAG-OX) 400 MG tablet Take 400 mg by mouth daily.    Marland Kitchen omeprazole (PRILOSEC) 20 MG capsule Take 20 mg by mouth daily.    . Tiotropium Bromide Monohydrate (SPIRIVA RESPIMAT) 2.5 MCG/ACT AERS Inhale 1 puff into the lungs daily.     No facility-administered medications prior to visit.         Objective:   Physical Exam  Vitals:   03/22/19 1116  BP: 136/84  Pulse: 60  SpO2: 97%  Weight: 188 lb (85.3 kg)  Height: 6' (1.829 m)   Gen: Thin man, in no distress,  normal affect  ENT: No lesions,  mouth clear,  oropharynx clear, no postnasal drip  Neck: No JVD, no stridor  Lungs: No use of accessory muscles, no wheeze or crackles  Cardiovascular: RRR, heart sounds normal, no murmur or gallops, no peripheral edema  Musculoskeletal: No deformities, L knee larger than R, non-tender  Neuro: alert, non focal  Skin: Warm, no lesions or rash      Assessment & Plan:  COPD (chronic obstructive pulmonary disease) ([facility]) Doing well, compensated on his current regimen.  Does have some exertional fatigue.  I talked to him today about possibly using albuterol a bit more liberally, possibly pretreating to see if he benefits.  Please continue Anoro once daily. Keep your albuterol available use 2 puffs if needed for shortness of breath, chest tightness, wheezing.  You might want to consider taking 2 puffs about 10 minutes before exertion, yardwork, to see if this makes your tasks easier. Flu shot and pneumonia shot are both up-to-date. Follow with Dr. Lamonte Sakai in 12 months or sooner if you have any problems.   Baltazar Apo, MD, PhD 03/22/2019, 11:41 AM [facility] [phone] or if no answer [phone]

## 2019-03-23 DIAGNOSIS — H33321 Round hole, right eye: Secondary | ICD-10-CM | POA: Diagnosis not present

## 2019-03-23 DIAGNOSIS — Z961 Presence of intraocular lens: Secondary | ICD-10-CM | POA: Diagnosis not present

## 2019-03-23 DIAGNOSIS — H43812 Vitreous degeneration, left eye: Secondary | ICD-10-CM | POA: Diagnosis not present

## 2019-03-23 DIAGNOSIS — H3562 Retinal hemorrhage, left eye: Secondary | ICD-10-CM | POA: Diagnosis not present

## 2019-03-23 DIAGNOSIS — H35052 Retinal neovascularization, unspecified, left eye: Secondary | ICD-10-CM | POA: Diagnosis not present

## 2019-03-29 DIAGNOSIS — S56912A Strain of unspecified muscles, fascia and tendons at forearm level, left arm, initial encounter: Secondary | ICD-10-CM | POA: Diagnosis not present

## 2019-03-29 DIAGNOSIS — S56911A Strain of unspecified muscles, fascia and tendons at forearm level, right arm, initial encounter: Secondary | ICD-10-CM | POA: Diagnosis not present

## 2019-05-12 DIAGNOSIS — B078 Other viral warts: Secondary | ICD-10-CM | POA: Diagnosis not present

## 2019-05-16 DIAGNOSIS — N401 Enlarged prostate with lower urinary tract symptoms: Secondary | ICD-10-CM | POA: Diagnosis not present

## 2019-05-16 DIAGNOSIS — N320 Bladder-neck obstruction: Secondary | ICD-10-CM | POA: Diagnosis not present

## 2019-05-16 DIAGNOSIS — N411 Chronic prostatitis: Secondary | ICD-10-CM | POA: Diagnosis not present

## 2019-05-16 DIAGNOSIS — R972 Elevated prostate specific antigen [PSA]: Secondary | ICD-10-CM | POA: Diagnosis not present

## 2019-05-16 DIAGNOSIS — N528 Other male erectile dysfunction: Secondary | ICD-10-CM | POA: Diagnosis not present

## 2019-05-16 DIAGNOSIS — G8929 Other chronic pain: Secondary | ICD-10-CM | POA: Diagnosis not present

## 2019-05-16 DIAGNOSIS — N529 Male erectile dysfunction, unspecified: Secondary | ICD-10-CM | POA: Diagnosis not present

## 2019-05-16 DIAGNOSIS — R102 Pelvic and perineal pain: Secondary | ICD-10-CM | POA: Diagnosis not present

## 2019-06-06 ENCOUNTER — Telehealth: Payer: Self-pay | Admitting: *Deleted

## 2019-06-06 NOTE — Telephone Encounter (Signed)
He can take Clindamycin 600 mg or Azithromycin 500 mg once about 30-60 minutes prior to the procedure.

## 2019-06-06 NOTE — Telephone Encounter (Signed)
Patient called to report that he is having oral surgery 06/14/19 and the Dentist wants to know what antibiotic the provider would recommend for pretreating him due to prior post knee replacement infection. Advised will ask provider and give him a call back.

## 2019-07-12 DIAGNOSIS — D485 Neoplasm of uncertain behavior of skin: Secondary | ICD-10-CM | POA: Diagnosis not present

## 2019-07-12 DIAGNOSIS — D2239 Melanocytic nevi of other parts of face: Secondary | ICD-10-CM | POA: Diagnosis not present

## 2019-07-12 DIAGNOSIS — D225 Melanocytic nevi of trunk: Secondary | ICD-10-CM | POA: Diagnosis not present

## 2019-07-12 DIAGNOSIS — L820 Inflamed seborrheic keratosis: Secondary | ICD-10-CM | POA: Diagnosis not present

## 2019-07-21 DIAGNOSIS — D485 Neoplasm of uncertain behavior of skin: Secondary | ICD-10-CM | POA: Diagnosis not present

## 2019-07-21 DIAGNOSIS — L988 Other specified disorders of the skin and subcutaneous tissue: Secondary | ICD-10-CM | POA: Diagnosis not present

## 2019-07-25 IMAGING — DX DG FOOT COMPLETE 3+V*L*
3 series · 3 of 3 positions shown · non-contrast
Comparison: None.

CLINICAL DATA: Stepped on something with foreign body sensation
laterally.

EXAM:
LEFT FOOT - COMPLETE 3+ VIEW

[foot ap]
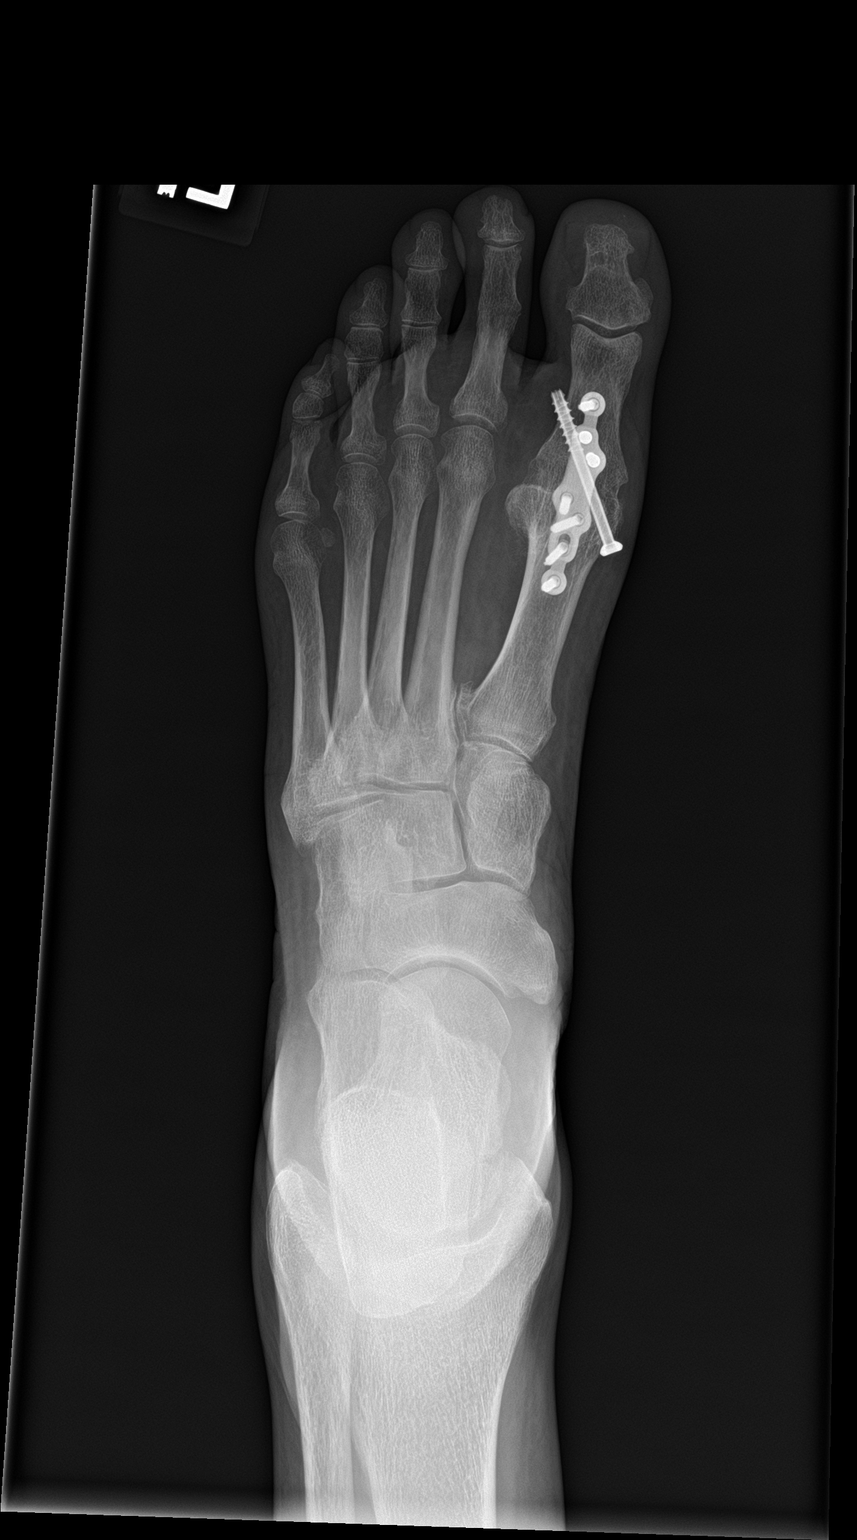

[foot obl]
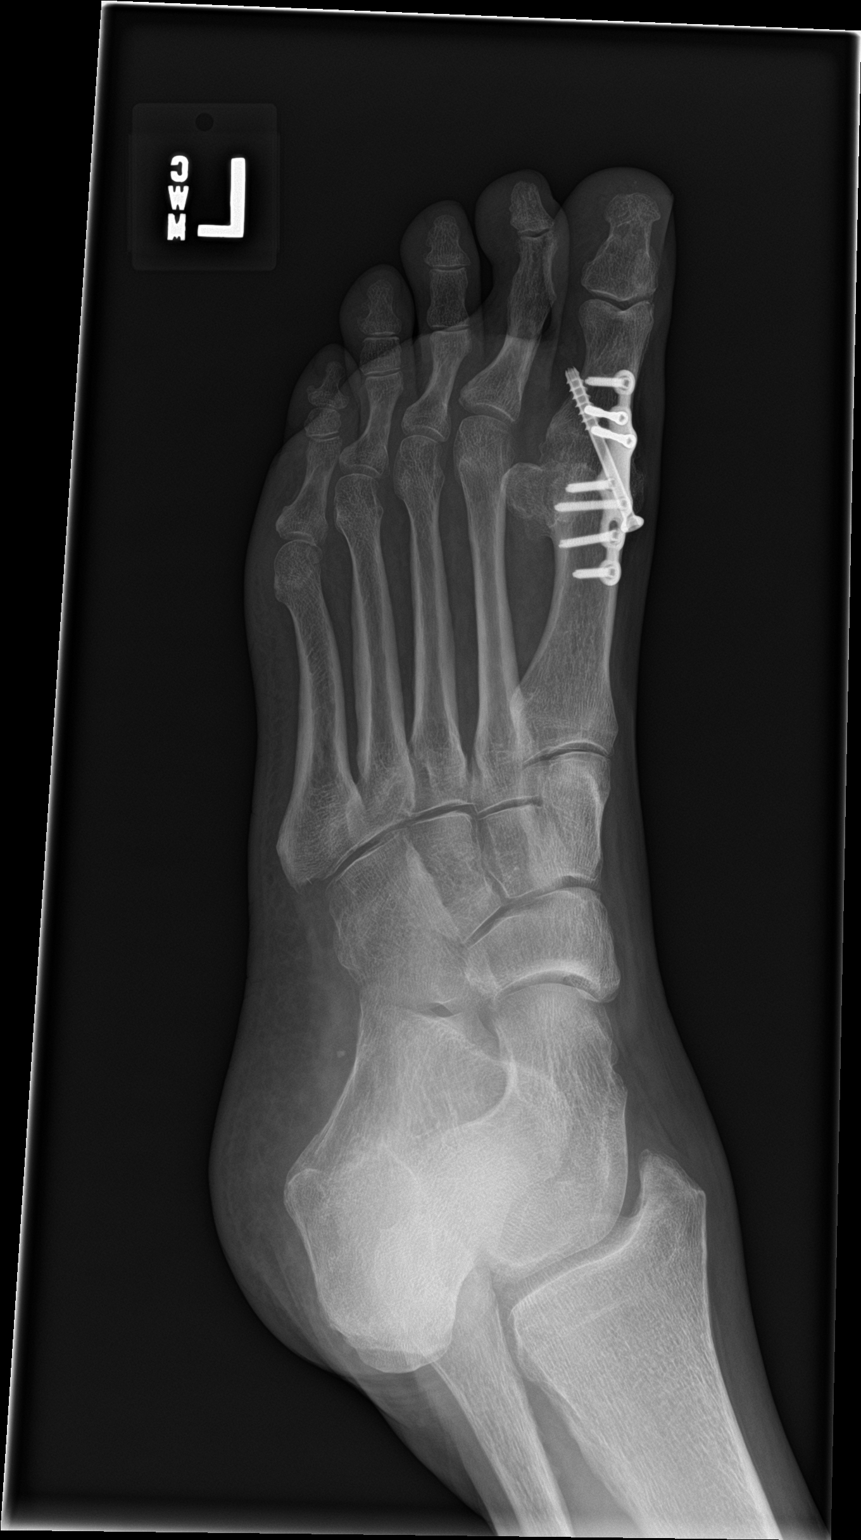

[foot lat]
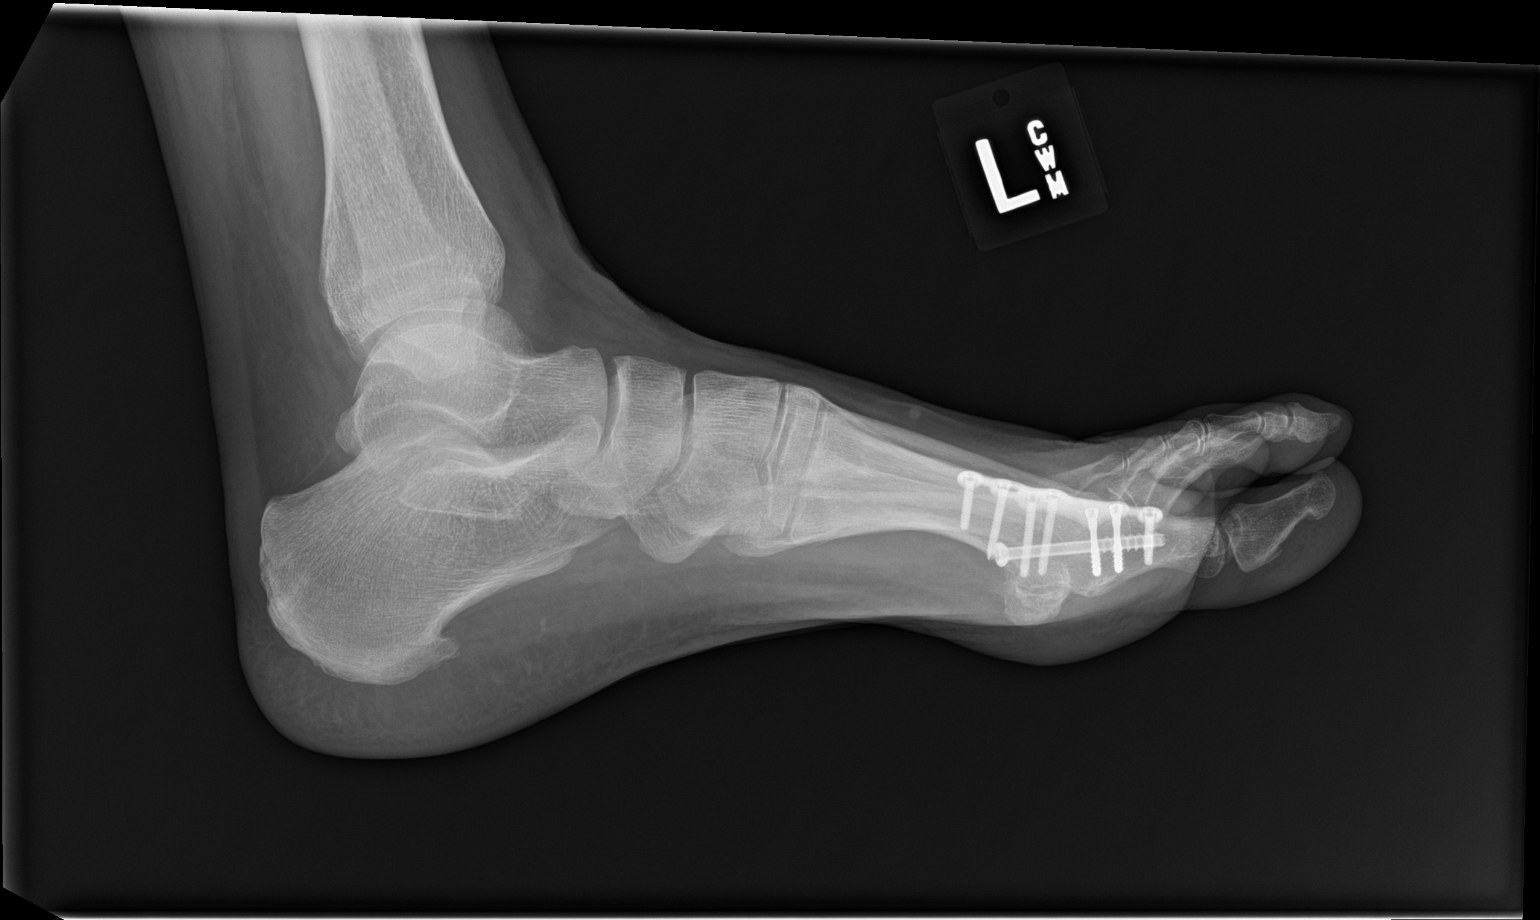

[3 of 3 positions shown; findings below may reference images not displayed]

FINDINGS: Small linear 2 x 3 mm density in the plantar lateral foot at the
level of the calcaneus may be a soft tissue calcification or foreign
body. No other radiopaque foreign body. No soft tissue air. No
fracture. Prior first metatarsal phalangeal joint fusion with intact
hardware. Bony fusion of the second toe proximal interphalangeal
joint without hardware.
IMPRESSION: Small linear 2 x 3 mm density in the plantar lateral foot at the
level of the calcaneus may be a small foreign body or incidental
soft tissue calcification. Recommend correlation with site of
foreign body sensation. No other radiopaque foreign body.

No acute osseous abnormalities.

## 2019-08-16 ENCOUNTER — Telehealth: Payer: Self-pay

## 2019-08-16 NOTE — Telephone Encounter (Signed)
Patient called to report that he is having oral surgery 08-28-19 and the Dentist wants to know what antibiotic the provider would recommend for pretreating him due to prior post knee replacement infection. Advised will ask provider and give him a call back.  Eugenia Mcalpine

## 2019-08-18 ENCOUNTER — Other Ambulatory Visit: Payer: Self-pay | Admitting: Family

## 2019-08-18 MED ORDER — AZITHROMYCIN 250 MG PO TABS: 500.0000 mg | ORAL_TABLET | Freq: Once | ORAL | 0 refills | Status: AC

## 2019-08-18 NOTE — Telephone Encounter (Signed)
I will send in 500 mg of azithromycin for him to take 30 to 60 minutes before his dental work.

## 2019-08-18 NOTE — Telephone Encounter (Signed)
Left patient a VM notifying him of pre-med ordered and sent to pharmacy as requested. Eugenia Mcalpine

## 2019-09-08 ENCOUNTER — Emergency Department (HOSPITAL_COMMUNITY)
Admission: EM | Admit: 2019-09-08 | Discharge: 2019-09-08 | Disposition: A | Discharge status: Home / Self Care | Payer: PPO | Attending: Emergency Medicine | Admitting: Emergency Medicine

## 2019-09-08 ENCOUNTER — Other Ambulatory Visit: Payer: Self-pay

## 2019-09-08 ENCOUNTER — Other Ambulatory Visit: Payer: Self-pay | Admitting: Cardiology

## 2019-09-08 ENCOUNTER — Emergency Department (HOSPITAL_COMMUNITY): Payer: PPO

## 2019-09-08 ENCOUNTER — Encounter (HOSPITAL_COMMUNITY): Payer: Self-pay | Admitting: *Deleted

## 2019-09-08 DIAGNOSIS — I10 Essential (primary) hypertension: Secondary | ICD-10-CM | POA: Diagnosis not present

## 2019-09-08 DIAGNOSIS — Z7982 Long term (current) use of aspirin: Secondary | ICD-10-CM | POA: Insufficient documentation

## 2019-09-08 DIAGNOSIS — I208 Other forms of angina pectoris: Secondary | ICD-10-CM | POA: Diagnosis not present

## 2019-09-08 DIAGNOSIS — Z87891 Personal history of nicotine dependence: Secondary | ICD-10-CM | POA: Insufficient documentation

## 2019-09-08 DIAGNOSIS — R0789 Other chest pain: Secondary | ICD-10-CM | POA: Diagnosis not present

## 2019-09-08 DIAGNOSIS — J449 Chronic obstructive pulmonary disease, unspecified: Secondary | ICD-10-CM | POA: Diagnosis not present

## 2019-09-08 DIAGNOSIS — R0602 Shortness of breath: Secondary | ICD-10-CM | POA: Insufficient documentation

## 2019-09-08 DIAGNOSIS — Z79899 Other long term (current) drug therapy: Secondary | ICD-10-CM | POA: Diagnosis not present

## 2019-09-08 DIAGNOSIS — R079 Chest pain, unspecified: Secondary | ICD-10-CM | POA: Diagnosis not present

## 2019-09-08 DIAGNOSIS — I209 Angina pectoris, unspecified: Secondary | ICD-10-CM

## 2019-09-08 LAB — BASIC METABOLIC PANEL
Anion gap: 18 — ABNORMAL HIGH (ref 5–15)
BUN: 13 mg/dL (ref 8–23)
CO2: 14 mmol/L — ABNORMAL LOW (ref 22–32)
Calcium: 9.7 mg/dL (ref 8.9–10.3)
Chloride: 108 mmol/L (ref 98–111)
Creatinine, Ser: 0.96 mg/dL (ref 0.61–1.24)
GFR calc Af Amer: >60 mL/min (ref >60)
GFR calc non Af Amer: >60 mL/min (ref >60)
Glucose, Bld: 110 mg/dL — ABNORMAL HIGH (ref 70–99)
Potassium: 4.1 mmol/L (ref 3.5–5.1)
Sodium: 140 mmol/L (ref 135–145)

## 2019-09-08 LAB — CBC
HCT: 48.8 % (ref 39.0–52.0)
Hemoglobin: 15.6 g/dL (ref 13.0–17.0)
MCH: 28.3 pg (ref 26.0–34.0)
MCHC: 32 g/dL (ref 30.0–36.0)
MCV: 88.4 fL (ref 80.0–100.0)
Platelets: 178 10*3/uL (ref 150–400)
RBC: 5.52 MIL/uL (ref 4.22–5.81)
RDW: 15.2 % (ref 11.5–15.5)
WBC: 7 10*3/uL (ref 4.0–10.5)
nRBC: 0 % (ref 0.0–0.2)

## 2019-09-08 LAB — TROPONIN I (HIGH SENSITIVITY)
Troponin I (High Sensitivity): 4 ng/L (ref <18)
Troponin I (High Sensitivity): 6 ng/L (ref <18)

## 2019-09-08 MED ORDER — SODIUM CHLORIDE 0.9% FLUSH: 3.0000 mL | Freq: Once | INTRAVENOUS | Status: DC

## 2019-09-08 MED ORDER — HYDROCHLOROTHIAZIDE 12.5 MG PO CAPS: 12.5000 mg | ORAL_CAPSULE | Freq: Every morning | ORAL | 3 refills | Status: DC

## 2019-09-08 MED ORDER — NITROGLYCERIN 0.4 MG SL SUBL: 0.4000 mg | SUBLINGUAL_TABLET | SUBLINGUAL | 3 refills | Status: AC | PRN

## 2019-09-08 NOTE — ED Provider Notes (Addendum)
  Physical Exam  BP 118/73 (BP Location: Right Arm)   Pulse 78   Temp 97.8 F (36.6 C) (Oral)   Resp 20   SpO2 100%   Physical Exam  ED Course/Procedures     Procedures  MDM  Assuming care of patient from Dr. Rex Kras.   Patient in the ED for chest pain. Workup thus far shows no acute findings. EKG reassuring. CP is exertional.  Concerning findings are as following : none Important pending results are : recommendation by Dr. Einar Gip  According to Dr. Rex Kras, plan is to dispo per Dr. Irven Shelling recommendation.   Patient had no complains, no concerns from the nursing side. Will continue to monitor.    Varney Biles, MD 09/08/19 1555   5:10 PM Cardiology has cleared the patient. Delta troponins have been reassuring. He has been provided with outpatient stress test.  I will discussed strict ER return precautions with him.   Varney Biles, MD 09/08/19 [phone]

## 2019-09-08 NOTE — Discharge Instructions (Signed)
Your cardiology team has assessed you and they have recommended a close follow-up and stress test. Take the meds prescribed.  Please return to the ER if you have worsening chest pain, shortness of breath, pain radiating to your jaw, shoulder, or back, sweats or fainting. Otherwise see the Cardiologist or your primary care doctor as requested.

## 2019-09-08 NOTE — Consult Note (Signed)
CARDIOLOGY CONSULT NOTE  Patient ID: Joseph [name] MRN: [id number] DOB/AGE: Aug 01, 1934 85 y.o.  Admit date: 09/08/2019 Referring Physician: Dr. Roby Lofts Primary Physician: Deland Pretty, MD Reason for Consultation:  Chest Pain.  HPI:  Joseph [name] is a 85 y.o. male who presents with a chief complaint of "Chest pain." Patient's past medical history and cardiac risk factors include: Hypertension, hyperlipidemia, advanced age, former smoker.  Patient presents to the hospital chief complaint of chest pain.  Patient states his symptoms started approximately 3 days ago.  When he experienced it again this morning he called EMS and came to the hospital for further evaluation.  Patient states that the chest pain is substernally located, 4-10 in intensity, ache/pressure-like sensation, nonradiating, at times worse with effort related activities and improves with rest.  Patient states that he is able to walk about 1.5 miles without any significant worsening of symptoms but beyond that he starts having chest discomfort.  Associated symptoms also include shortness of breath.  Patient had an echocardiogram in July 2019 which noted an LVEF of 123456, grade 1 diastolic impairment, mild TR, RVSP noted to be 32 mmHg.  He also had a nuclear stress test in June 2019 which was noted to be a low risk study.  Patient denies any prior history of myocardial infarction, coronary artery disease, TIA/stroke, congestive heart failure, DVT or PE.  ALLERGIES: Allergies  Allergen Reactions  . Ciprofloxacin Anaphylaxis and Other (See Comments)    Caused sores & blisters and a trip to the hospital- was also taking Aldara at the same time, however  . Ciprocin-Fluocin-Procin [Fluocinolone Acetonide] Other (See Comments)    Blisters and sores  . Fluocinolone Acetonide Other (See Comments)    Reaction not recalled  . Quinolones Other (See Comments)    Pt had to go to hospital (sores and blisters)  . Statins Other  (See Comments)    Made the legs hurt  . Imiquimod Other (See Comments)    (Aldara) Caused sores that landed him in the hospital for 3 days- was taking Cipro at the same time, however  . Penicillins Itching, Rash and Other (See Comments)    Has patient had a PCN reaction causing immediate rash, facial/tongue/throat swelling, SOB or lightheadedness with hypotension: Yes Has patient had a PCN reaction causing severe rash involving mucus membranes or skin necrosis: No Has patient had a PCN reaction that required hospitalization: No Has patient had a PCN reaction occurring within the last 10 years: No If all of the above answers are "NO", then may proceed with Cephalosporin use.     PAST MEDICAL HISTORY: Past Medical History:  Diagnosis Date  . Arthritis   . COPD (chronic obstructive pulmonary disease) ([place])   . Dyspnea    on exertion  . Hyperlipidemia   . Hypertension   . Pneumothorax, spontaneous, tension 1960  . SOB (shortness of breath) on exertion     PAST SURGICAL HISTORY: Past Surgical History:  Procedure Laterality Date  . EYE SURGERY     bilateral cataract with lens implants  . LUNG SURGERY     40 years ago  . TOE SURGERY    . TOTAL KNEE ARTHROPLASTY Left 10/06/2017   Procedure: LEFT TOTAL KNEE ARTHROPLASTY;  Surgeon: Susa Day, MD;  Location: [facility];  Service: Orthopedics;  Laterality: Left;  Adductor Block    FAMILY HISTORY: The patient family history is not on file.   SOCIAL HISTORY:  The patient  reports that he quit smoking  about 60 years ago. His smoking use included cigarettes. He has a 5.00 pack-year smoking history. He has never used smokeless tobacco. He reports that he does not drink alcohol or use drugs.  MEDICATIONS: (Not in a hospital admission)  14 ORGAN REVIEW OF SYSTEMS: CONSTITUTIONAL: No fever or significant weight loss EYES: No recent significant visual change EARS, NOSE, MOUTH, THROAT: No recent significant change in  hearing CARDIOVASCULAR: See discussion in subjective/HPI RESPIRATORY: See discussion in subjective/HPI GASTROINTESTINAL: No recent complaints of abdominal pain GENITOURINARY: No recent significant change in genitourinary status MUSCULOSKELETAL: No recent significant change in musculoskeletal status INTEGUMENTARY: No recent rash NEUROLOGIC: No recent significant change in motor function PSYCHIATRIC: No recent significant change in mood ENDOCRINOLOGIC: No recent significant change in endocrine status HEMATOLOGIC/LYMPHATIC: No recent significant unexpected bruising ALLERGIC/IMMUNOLOGIC: No recent unexplained allergic reaction  PHYSICAL EXAM: Vitals with BMI 09/08/2019 09/08/2019 09/08/2019  Height - - -  Weight - - -  BMI - - -  Systolic [id number] [id number]  Diastolic 73 75 94  Pulse 67 61 66   CONSTITUTIONAL: Well-developed and well-nourished. No acute distress.  SKIN: Skin is warm and dry. No rash noted. No cyanosis. No pallor. No jaundice HEAD: Normocephalic and atraumatic.  EYES: No scleral icterus MOUTH/THROAT: Moist oral membranes.  NECK: No JVD present. No thyromegaly noted. No carotid bruits  LYMPHATIC: No visible cervical adenopathy.  CHEST Normal respiratory effort. No intercostal retractions  LUNGS: Clear to auscultation bilaterally.  No stridor. No wheezes. No rales.  CARDIOVASCULAR: Regular rate and rhythm, positive S1-S2, no murmurs rubs or gallops auscultated. ABDOMINAL: Soft, nontender, nondistended, positive bowel sounds in all 4 quadrants.  No apparent ascites.  EXTREMITIES: No peripheral edema, warm to touch bilaterally, 2+ dorsalis pedis and posterior tibial pulses. HEMATOLOGIC: No significant bruising NEUROLOGIC: Oriented to person, place, and time. Nonfocal. Normal muscle tone.  PSYCHIATRIC: Normal mood and affect. Normal behavior. Cooperative  RADIOLOGY: Chest x-ray 09/08/2019: No active cardiopulmonary disease.  CARDIAC DATABASE: EKG: 09/08/2019 0937: Normal sinus rhythm  with a ventricular rate of 82 bpm, normal axis deviation, no underlying ischemia or injury pattern.  Echocardiogram: 02/01/2018: Left ventricle cavity is normal in size. Mild concentric hypertrophy of the left ventricle. Normal global wall motion. Doppler evidence of grade I (impaired) diastolic dysfunction, normal LAP. Calculated EF 52%. Mild tricuspid regurgitation. Estimated pulmonary artery systolic pressure 32 mmHg. Large liver cyst (7X7 cm) noted. Consider dedicated imaging, if clinically indicated.  Stress Testing:  Exercise myoview stress 12/31/2017: 1. The resting electrocardiogram demonstrated normal sinus rhythm and normal resting conduction. Poor R wave progression. Stress EKG is negative for ischemia. Patient exercised on Bruce protocol for 5.00 minutes and achieved 5.72METS. Stress test terminated due to dyspnea and 89% MPHR achieved (Target HR >85%). 2. Myocardial perfusion imaging is normal. Overall left ventricular systolic function was normal without regional wall motion abnormalities. The left ventricular ejection fraction was 62%. This is a low risk study.  Heart Catheterization: None  LABORATORY DATA: CBC Latest Ref Rng & Units 09/08/2019 06/09/2018 10/08/2017  WBC 4.0 - 10.5 K/uL 7.0 6.1 10.0  Hemoglobin 13.0 - 17.0 g/dL 15.6 14.6 11.5(L)  Hematocrit 39.0 - 52.0 % 48.8 44.4 35.0(L)  Platelets 150 - 400 K/uL 178 149 135(L)    CMP Latest Ref Rng & Units 09/08/2019 06/09/2018 10/07/2017  Glucose 70 - 99 mg/dL 110(H) 105(H) 123(H)  BUN 8 - 23 mg/dL 13 13 10   Creatinine 0.61 - 1.24 mg/dL 0.96 1.01 0.82  Sodium 135 - 145 mmol/L 140  139 141  Potassium 3.5 - 5.1 mmol/L 4.1 4.0 4.3  Chloride 98 - 111 mmol/L 108 106 107  CO2 22 - 32 mmol/L 14(L) 24 29  Calcium 8.9 - 10.3 mg/dL 9.7 9.5 8.4(L)  Total Protein 6.1 - 8.1 g/dL - 7.1 -  Total Bilirubin 0.2 - 1.2 mg/dL - 0.3 -  Alkaline Phos 39 - 117 U/L - - -  AST 10 - 35 U/L - 17 -  ALT 9 - 46 U/L - 9 -   Cardiac Panel (last 3  results) Recent Labs    09/08/19 1001 09/08/19 1517  TROPONINIHS 4 6    Radiology: DG Chest 2 View  Result Date: 09/08/2019 CLINICAL DATA:  Chest pain EXAM: CHEST - 2 VIEW COMPARISON:  06/24/2016 FINDINGS: The heart size and mediastinal contours are within normal limits. Focal eventration of the right hemidiaphragm. No focal airspace consolidation, pleural effusion, or pneumothorax. Degenerative changes of the thoracic spine. IMPRESSION: No active cardiopulmonary disease. Electronically Signed   By: Davina Poke D.O.   On: 09/08/2019 10:22   Scheduled Meds: . sodium chloride flush  3 mL Intravenous Once   Continuous Infusions: PRN Meds:.  IMPRESSION & RECOMMENDATIONS: Angina pectoris:  EKG shows normal sinus rhythm without underlying ischemia or injury pattern.  Two high sensitive troponins within normal limits.  Patient symptoms are well controlled at the time of the evaluation.  Continue aspirin 81 mg p.o. daily.  Patient will be prescribed sublingual nitroglycerin tablets to use on as needed basis.  Medication profile discussed.  Also recommended restarting hydrochlorothiazide 12.5 mg p.o. every morning.  For better blood pressure management.  Not started on beta-blocker therapy as his heart rate is around 60bpm on telemetry.  Patient is scheduled for nuclear stress test on Monday at 8:30 AM at [facility] cardiovascular for further restratification.  Patient is in agreement with the plan of care.  He also understands that if he has worsening symptoms that increase in intensity, frequency, or duration he should come back to the ER for further evaluation.  Benign essential hypertension: Not well controlled.  We will start hydrochlorothiazide 12.5 mg p.o. every morning.  Continue Norvasc at 5 mg p.o. every afternoon.  Patient is asked to keep a log of his blood pressures and to bring it in the next office visit.  Mixed hyperlipidemia:  Patient is currently on  lovastatin and takes it in the morning.  Patient is educated on having it after supper.  Follow-up as outpatient.  Patient's questions and concerns were addressed to his satisfaction. He voices understanding of the instructions provided during this encounter.   This note was created using a voice recognition software as a result there may be grammatical errors inadvertently enclosed that do not reflect the nature of this encounter. Every attempt is made to correct such errors.  Rex Kras, DO, [facility]. [place] Office: [phone] 09/08/2019, 5:13 PM

## 2019-09-08 NOTE — ED Provider Notes (Signed)
[facility] EMERGENCY DEPARTMENT Provider Note   CSN: [id number] Arrival date & time: 09/08/19  [id number]     History Chief Complaint  Patient presents with  . Chest Pain    Joseph [name] is a 85 y.o. male.  85yo M w/ PMH including COPD, HTN, HLD who p/w chest pain. He reports a few days of central, non-radiating chest pressure/heaviness that is associated w/ exertion. He reports associated SOB, no associated N/V, diaphoresis, or lightheadedness. CP seems to resolve at night w/ rest but comes back when he gets up and starts doing activities/walking.He denies fever, cough/cold, leg swelling, or recent illness. He has seen a cardiologist and had stress tests in the past but none recently.  The history is provided by the patient.  Chest Pain      Past Medical History:  Diagnosis Date  . Arthritis   . COPD (chronic obstructive pulmonary disease) ([place])   . Dyspnea    on exertion  . Hyperlipidemia   . Hypertension   . Pneumothorax, spontaneous, tension 1960  . SOB (shortness of breath) on exertion     Patient Active Problem List   Diagnosis Date Noted  . Infection of prosthetic left knee joint ([place]) 02/17/2018  . Stiffness of left knee 10/26/2017  . Essential hypertension, benign 10/13/2017  . Hypothyroidism due to acquired atrophy of thyroid 10/13/2017  . GERD without esophagitis 10/13/2017  . BPH with obstruction/lower urinary tract symptoms 10/13/2017  . Chronic constipation 10/13/2017  . S/P total knee arthroplasty, left 10/12/2017  . Left knee DJD 10/06/2017  . Lung nodule 01/09/2016  . COPD (chronic obstructive pulmonary disease) ([place]) 01/09/2016  . ED (erectile dysfunction) of organic origin 02/15/2012  . Elevated prostate specific antigen (PSA) 02/15/2012  . Hyperlipidemia   . Pneumothorax, spontaneous, tension   . SOB (shortness of breath) on exertion     Past Surgical History:  Procedure Laterality Date  . EYE SURGERY     bilateral  cataract with lens implants  . LUNG SURGERY     40 years ago  . TOE SURGERY    . TOTAL KNEE ARTHROPLASTY Left 10/06/2017   Procedure: LEFT TOTAL KNEE ARTHROPLASTY;  Surgeon: Susa Day, MD;  Location: [facility];  Service: Orthopedics;  Laterality: Left;  Adductor Block       No family history on file.  Social History   Tobacco Use  . Smoking status: Former Smoker    Packs/day: 1.00    Years: 5.00    Pack years: 5.00    Types: Cigarettes    Quit date: 11/24/1958    Years since quitting: 60.8  . Smokeless tobacco: Never Used  Substance Use Topics  . Alcohol use: No  . Drug use: No    Home Medications Prior to Admission medications   Medication Sig Start Date End Date Taking? Authorizing Provider  albuterol (PROVENTIL HFA;VENTOLIN HFA) 108 (90 Base) MCG/ACT inhaler Inhale 1 puff into the lungs every 6 (six) hours as needed for wheezing or shortness of breath.     [provider]  amLODipine (NORVASC) 5 MG tablet Take 1 tablet (5 mg total) by mouth daily. 10/23/11   Martinique, Peter M, MD  aspirin 81 MG chewable tablet Chew 81 mg by mouth daily.    [provider]  Dutasteride-Tamsulosin HCl (JALYN) 0.5-0.4 MG CAPS Take 1 capsule by mouth daily.     [provider]  famotidine (PEPCID) 20 MG tablet famotidine 20 mg tablet   20 mg  by oral route. 04/01/13   [provider]  hydrochlorothiazide (,MICROZIDE/HYDRODIURIL,) 12.5 MG capsule TAKE ONE CAPSULE BY MOUTH EVERY DAY 03/09/11   Martinique, Peter M, MD  levothyroxine (SYNTHROID) 125 MCG tablet Take 125 mcg by mouth daily.     [provider]  lovastatin (MEVACOR) 40 MG tablet  10/31/17   [provider]  magnesium oxide (MAG-OX) 400 MG tablet Take 400 mg by mouth daily.    [provider]  omeprazole (PRILOSEC) 20 MG capsule Take 20 mg by mouth daily.    [provider]    Allergies    Ciprofloxacin, Ciprocin-fluocin-procin [fluocinolone acetonide], Fluocinolone  acetonide, Imiquimod, Quinolones, Statins, Imiquimod, and Penicillins  Review of Systems   Review of Systems  Cardiovascular: Positive for chest pain.   All other systems reviewed and are negative except that which was mentioned in HPI  Physical Exam Updated Vital Signs BP 118/73 (BP Location: Right Arm)   Pulse 78   Temp 97.8 F (36.6 C) (Oral)   Resp 20   SpO2 100%   Physical Exam Vitals and nursing note reviewed.  Constitutional:      General: He is not in acute distress.    Appearance: He is well-developed.     Comments: pleasant  HENT:     Head: Normocephalic and atraumatic.  Eyes:     Conjunctiva/sclera: Conjunctivae normal.  Cardiovascular:     Rate and Rhythm: Normal rate and regular rhythm.     Heart sounds: Normal heart sounds. No murmur.  Pulmonary:     Effort: Pulmonary effort is normal.     Breath sounds: Normal breath sounds.  Abdominal:     General: Bowel sounds are normal. There is no distension.     Palpations: Abdomen is soft.     Tenderness: There is no abdominal tenderness.  Musculoskeletal:     Cervical back: Neck supple.     Right lower leg: No edema.     Left lower leg: No edema.  Skin:    General: Skin is warm and dry.  Neurological:     Mental Status: He is alert and oriented to person, place, and time.     Comments: Fluent speech  Psychiatric:        Judgment: Judgment normal.     ED Results / Procedures / Treatments   Labs (all labs ordered are listed, but only abnormal results are displayed) Labs Reviewed  BASIC METABOLIC PANEL - Abnormal; Notable for the following components:      Result Value   CO2 14 (*)    Glucose, Bld 110 (*)    Anion gap 18 (*)    All other components within normal limits  CBC  TROPONIN I (HIGH SENSITIVITY)  TROPONIN I (HIGH SENSITIVITY)    EKG EKG Interpretation  Date/Time:  Friday September 08 2019 09:37:13 EST Ventricular Rate:  82 PR Interval:  150 QRS Duration: 88 QT Interval:  370 QTC  Calculation: 432 R Axis:   61 Text Interpretation: Normal sinus rhythm Normal ECG No significant change since last tracing Confirmed by Theotis Burrow [phone]) on 09/08/2019 11:38:53 AM   Radiology DG Chest 2 View  Result Date: 09/08/2019 CLINICAL DATA:  Chest pain EXAM: CHEST - 2 VIEW COMPARISON:  06/24/2016 FINDINGS: The heart size and mediastinal contours are within normal limits. Focal eventration of the right hemidiaphragm. No focal airspace consolidation, pleural effusion, or pneumothorax. Degenerative changes of the thoracic spine. IMPRESSION: No active cardiopulmonary disease. Electronically Signed   By:  Nicholas  Plundo D.O.   On: 09/08/2019 10:22    Procedures Procedures (including critical care time)  Medications Ordered in ED Medications  sodium chloride flush (NS) 0.9 % injection 3 mL (3 mLs Intravenous Not Given 09/08/19 1139)    ED Course  I have reviewed the triage vital signs and the nursing notes.  Pertinent labs & imaging results that were available during my care of the patient were reviewed by me and considered in my medical decision making (see chart for details).    MDM Rules/Calculators/A&P                      Pleasant and well-appearing on exam with reassuring vital signs.  EKG without acute ischemic changes compared to previous.  Chest x-ray clear.  Symptoms are concerning for unstable angina, no risk factors or description that suggests PE or dissection. Pain resolved currently. Labs show normal CBC, reassuring BMP, negative initial trop.  Discussed w/ cardiologist, Dr. Einar Gip, who will come evaluate pt. Second trop pending. PT signed out to oncoming provider. Final Clinical Impression(s) / ED Diagnoses Final diagnoses:  None    Rx / DC Orders ED Discharge Orders    None       Aidee Latimore, Wenda Overland, MD 09/08/19 1445

## 2019-09-08 NOTE — ED Triage Notes (Signed)
C/o chest pain onset 3 days ago no different today states he thought it would go awy describes as pressure.

## 2019-09-11 ENCOUNTER — Ambulatory Visit: Payer: PPO

## 2019-09-11 ENCOUNTER — Other Ambulatory Visit: Payer: Self-pay | Admitting: Cardiology

## 2019-09-11 DIAGNOSIS — R079 Chest pain, unspecified: Secondary | ICD-10-CM

## 2019-09-11 DIAGNOSIS — I209 Angina pectoris, unspecified: Secondary | ICD-10-CM

## 2019-09-14 ENCOUNTER — Ambulatory Visit: Payer: PPO

## 2019-09-14 ENCOUNTER — Other Ambulatory Visit: Payer: Self-pay

## 2019-09-14 DIAGNOSIS — I209 Angina pectoris, unspecified: Secondary | ICD-10-CM | POA: Diagnosis not present

## 2019-09-18 ENCOUNTER — Ambulatory Visit: Payer: Self-pay | Admitting: Cardiology

## 2019-09-18 ENCOUNTER — Telehealth: Payer: PPO | Admitting: Cardiology

## 2019-09-18 DIAGNOSIS — Z87891 Personal history of nicotine dependence: Secondary | ICD-10-CM

## 2019-09-18 DIAGNOSIS — I10 Essential (primary) hypertension: Secondary | ICD-10-CM | POA: Diagnosis not present

## 2019-09-18 DIAGNOSIS — R0609 Other forms of dyspnea: Secondary | ICD-10-CM

## 2019-09-18 DIAGNOSIS — Z712 Person consulting for explanation of examination or test findings: Secondary | ICD-10-CM

## 2019-09-18 DIAGNOSIS — E782 Mixed hyperlipidemia: Secondary | ICD-10-CM | POA: Diagnosis not present

## 2019-09-18 DIAGNOSIS — R0600 Dyspnea, unspecified: Secondary | ICD-10-CM | POA: Diagnosis not present

## 2019-09-18 MED ORDER — AMLODIPINE BESYLATE 10 MG PO TABS: 10.0000 mg | ORAL_TABLET | Freq: Every day | ORAL | 1 refills | Status: DC

## 2019-09-18 NOTE — Patient Instructions (Signed)
Please remember to bring in your medication bottles in at the next visit.   Take hydrochlorothiazide 12.5 mg p.o. every morning daily and not as as needed basis.  Increase Norvasc to 10 mg p.o. every afternoon.  Please get labs done in about 1 week at the nearest [facility].  Recommend follow up with your PCP as scheduled.

## 2019-09-18 NOTE — Progress Notes (Signed)
Telephone visit note  Subjective:   Joseph [name], male    DOB: 1934-08-18, 85 y.o.   MRN: [id number]  I connected with the patient on 09/18/19 by a telephone call and verified that I am speaking with the correct person using two identifiers.     I offered the patient a video enabled application for a virtual visit. Unfortunately, this could not be accomplished due to technical difficulties/lack of video enabled phone/computer. I discussed the limitations of evaluation and management by telemedicine and the availability of in person appointments. The patient expressed understanding and agreed to proceed.   This visit type was conducted due to national recommendations for restrictions regarding the COVID-19 Pandemic (e.g. social distancing).  This format is felt to be most appropriate for this patient at this time.  All issues noted in this document were discussed and addressed.  No physical exam was performed (except for noted visual exam findings with Tele health visits).  The patient has consented to conduct a Tele health visit and understands insurance will be billed.   Chief complaint:  Elevated blood pressures and review test results  HPI  Joseph [name] is a 85 y.o. male who presents with a chief complaint of "elevated blood pressures and review test results." Patient's past medical history and cardiac risk factors include: Hypertension, hyperlipidemia, advanced age, former smoker.  Patient was recently seen in the hospital for evaluation of chest discomfort.  Patient had 2 negative high-sensitivity troponin levels, EKG did not demonstrate underlying injury pattern or overt ischemia, and his chest pain had resolved and therefore he was discharged home.   He was asked to follow-up for an echocardiogram and nuclear stress test to evaluate for coronary artery disease given his recent hospitalization.  Results of both the echo and nuclear stress test were reviewed with the patient at  today's telephone visit findings noted below for further reference.  Hypertension: Patient states that his blood pressures has been elevated since he was last seen in the office during the nuclear stress test.  At the time of the nuclear stress test he was hypotensive and therefore was recommended to discontinue hydrochlorothiazide.  However, since then he states that the systolic blood pressures have been ranging between 697-948 mmHg and diastolic blood pressures in the 90 mmHg range.  Patient states that his pulse is usually between 60-70 bpm.  He is only taking hydrochlorothiazide 12.5 mg as needed in the morning and Norvasc 5 mg p.o. every afternoon.  He continues to have effort related dyspnea which is chronic and stable.  He is able to continue to walk 1.5 miles on a daily basis.   Past Medical History:  Diagnosis Date  . Arthritis   . COPD (chronic obstructive pulmonary disease) ([place])   . Dyspnea    on exertion  . Hyperlipidemia   . Hypertension   . Pneumothorax, spontaneous, tension 1960  . SOB (shortness of breath) on exertion    Past Surgical History:  Procedure Laterality Date  . EYE SURGERY     bilateral cataract with lens implants  . LUNG SURGERY     40 years ago  . TOE SURGERY    . TOTAL KNEE ARTHROPLASTY Left 10/06/2017   Procedure: LEFT TOTAL KNEE ARTHROPLASTY;  Surgeon: Susa Day, MD;  Location: [facility];  Service: Orthopedics;  Laterality: Left;  Adductor Block   Social History   Tobacco Use  . Smoking status: Former Smoker    Packs/day: 1.00    Years:  5.00    Pack years: 5.00    Types: Cigarettes    Quit date: 11/24/1958    Years since quitting: 60.8  . Smokeless tobacco: Never Used  Substance Use Topics  . Alcohol use: No  . Drug use: No   No family history of premature coronary artery disease or sudden cardiac death.  Current Outpatient Medications on File Prior to Visit  Medication Sig Dispense Refill  . albuterol (PROAIR HFA) 108 (90 Base) MCG/ACT  inhaler Inhale 2 puffs into the lungs every 6 (six) hours as needed for wheezing or shortness of breath.     . hydrochlorothiazide (MICROZIDE) 12.5 MG capsule Take 12.5 mg by mouth in the morning.    Marland Kitchen levothyroxine (SYNTHROID) 137 MCG tablet Take 137 mcg by mouth daily before breakfast.    . lovastatin (MEVACOR) 40 MG tablet Take 40 mg by mouth daily.     . magnesium oxide (MAG-OX) 400 MG tablet Take 400 mg by mouth daily.    . meloxicam (MOBIC) 7.5 MG tablet Take 7.5 mg by mouth daily as needed for pain.     . nitroGLYCERIN (NITROSTAT) 0.4 MG SL tablet Place 1 tablet (0.4 mg total) under the tongue every 5 (five) minutes as needed for chest pain. 30 tablet 3  . omeprazole (PRILOSEC) 40 MG capsule Take 40 mg by mouth daily before breakfast.    . umeclidinium-vilanterol (ANORO ELLIPTA) 62.5-25 MCG/INH AEPB Inhale 1 puff into the lungs daily.    Marland Kitchen aspirin 81 MG chewable tablet Chew 81 mg by mouth daily.    . Dutasteride-Tamsulosin HCl (JALYN) 0.5-0.4 MG CAPS Take 1 capsule by mouth daily.      No current facility-administered medications on file prior to visit.   Cardiovascular and other pertinent studies: EKG: 09/08/2019 0937: Normal sinus rhythm with a ventricular rate of 82 bpm, normal axis deviation, no underlying ischemia or injury pattern.  Echocardiogram: 02/01/2018: Left ventricle cavity is normal in size. Mild concentric hypertrophy of the left ventricle. Normal global wall motion. Doppler evidence of grade I (impaired) diastolic dysfunction, normal LAP. Calculated EF 52%. Mild tricuspid regurgitation. Estimated pulmonary artery systolic pressure 32 mmHg. Large liver cyst (7X7 cm) noted. Consider dedicated imaging, if clinically indicated.  09/2019: LVEF 55-60%, mild LVH, normal diastolic filling pressures, normal left atrial pressures, mildly dilated left atrium, mild MR, mild TR. Large liver cyst (6.9x7.8cm) noted. Consider dedicated imaging, if clinically indicated.  Stress Testing:    Exercise myoview stress 12/31/2017: 1. The resting electrocardiogram demonstrated normal sinus rhythm and normal resting conduction. Poor R wave progression. Stress EKG is negative for ischemia. Patient exercised on Bruce protocol for 5.00 minutes and achieved 5.72METS. Stress test terminated due to dyspnea and 89% MPHR achieved (Target HR >85%). 2. Myocardial perfusion imaging is normal. Overall left ventricular systolic function was normal without regional wall motion abnormalities. The left ventricular ejection fraction was 62%. This is a low risk study.  Lexiscan 09/11/2019: Nondiagnostic ECG stress. The baseline blood pressure was 80/54 mmHg and decreased to 78/54 mmHg at peak infusion, which is a physiologic response to Intravenous Lexiscan. Patient asymptomatic. Resting EKG demonstrated normal sinus rhythm. Non-specific ST-T abnormality. Peak EKG revealed no significant ST-T change from baseline abnormality. Myocardial perfusion is normal. Stress LV EF: 52%. No previous exam available for comparison. Low risk study.   Heart Catheterization: None  Carotid Duplex: 05/2016: Minor carotid atherosclerosis. No hemodynamically significant ICA stenosis. Degree of narrowing less than 50% bilaterally.  Recent labs: 09/08/2019: Glucose 110, BUN/Cr  130.93 EGFR >60. Na/K 140/4.1.  H/H 15.6/48.8.   Review of Systems  Constitution: Negative for chills, diaphoresis, fever, weight gain and weight loss.  Cardiovascular: Positive for dyspnea on exertion. Negative for chest pain, claudication, leg swelling, near-syncope, orthopnea, palpitations, paroxysmal nocturnal dyspnea and syncope.  Respiratory: Positive for shortness of breath. Negative for cough, hemoptysis and wheezing.   Gastrointestinal: Negative for bloating and abdominal pain.  All other systems reviewed and are negative.  Physical Exam: Not performed, as this is a telephone visit  Impression and Recommendation:  Joseph [name] is a 85  y.o. male whose past medical history and cardiac risk factors include: Hypertension, hyperlipidemia, advanced age, former smoker.  Benign essential hypertension:  Blood pressure currently not at goal.  We will restart hydrochlorothiazide 12.5 mg p.o. daily.  Patient is asked to increase his Norvasc to 10 mg p.o. every afternoon  Check BMP and magnesium level in 1 week to evaluate kidney function and electrolytes.  Patient is asked to keep a log of his blood pressures and to bring them in the next office visit.  Dyspnea on exertion:  Patient states that his shortness of breath with effort related activities is chronic and stable.  His shortness of breath should improve with better blood pressure management.  He is undergone an ischemic evaluation including an echo and nuclear stress test.  LV function remains stable and no overt evidence of reversible ischemia on nuclear stress test.  The results were reviewed with the patient today in great detail and his questions and concerns were addressed to her satisfaction.  Mixed hyperlipidemia:  Continue statin therapy.  We will check a fasting lipid profile to reevaluate therapy.  Former smoker: Educated on importance of continued smoking cessation.  I will see the patient in the office in 4 weeks to go over his blood pressures, laboratory values, and reevaluate his symptoms.  Patient is asked to call the office sooner if his symptoms worsen in intensity, frequency and/or duration.  His questions and concerns were addressed to his satisfaction and verbal feedback was provided.  Orders Placed This Encounter  Procedures  . Basic metabolic panel    Standing Status:   Future    Standing Expiration Date:   12/19/2019    Order Specific Question:   Has the patient fasted?    Answer:   Yes  . Magnesium    Standing Status:   Future    Standing Expiration Date:   12/19/2019  . Lipid Panel With LDL/HDL Ratio    Standing Status:   Future     Standing Expiration Date:   10/19/2019    Order Specific Question:   Has the patient fasted?    Answer:   Yes    FINAL MEDICATIONS:  Current Outpatient Medications:  .  albuterol (PROAIR HFA) 108 (90 Base) MCG/ACT inhaler, Inhale 2 puffs into the lungs every 6 (six) hours as needed for wheezing or shortness of breath. , Disp: , Rfl:  .  hydrochlorothiazide (MICROZIDE) 12.5 MG capsule, Take 12.5 mg by mouth in the morning., Disp: , Rfl:  .  levothyroxine (SYNTHROID) 137 MCG tablet, Take 137 mcg by mouth daily before breakfast., Disp: , Rfl:  .  lovastatin (MEVACOR) 40 MG tablet, Take 40 mg by mouth daily. , Disp: , Rfl:  .  magnesium oxide (MAG-OX) 400 MG tablet, Take 400 mg by mouth daily., Disp: , Rfl:  .  meloxicam (MOBIC) 7.5 MG tablet, Take 7.5 mg by mouth daily as needed  for pain. , Disp: , Rfl:  .  nitroGLYCERIN (NITROSTAT) 0.4 MG SL tablet, Place 1 tablet (0.4 mg total) under the tongue every 5 (five) minutes as needed for chest pain., Disp: 30 tablet, Rfl: 3 .  omeprazole (PRILOSEC) 40 MG capsule, Take 40 mg by mouth daily before breakfast., Disp: , Rfl:  .  umeclidinium-vilanterol (ANORO ELLIPTA) 62.5-25 MCG/INH AEPB, Inhale 1 puff into the lungs daily., Disp: , Rfl:  .  amLODipine (NORVASC) 10 MG tablet, Take 1 tablet (10 mg total) by mouth daily., Disp: 90 tablet, Rfl: 1 .  aspirin 81 MG chewable tablet, Chew 81 mg by mouth daily., Disp: , Rfl:  .  Dutasteride-Tamsulosin HCl (JALYN) 0.5-0.4 MG CAPS, Take 1 capsule by mouth daily. , Disp: , Rfl:   Evaluation and management (49mn) with time spent obtaining history, reviewing labs, studies, greater than 50% of that time was spent in counseling and coordination care with the patient regarding complex decision making and discussion as state above, and documenting clinical evaluation.  --Continue cardiac medications as reconciled in final medication list. --Return in about 4 weeks (around 10/16/2019) for Discussion of labs, symptoms of  DOE, elevated BP. Or sooner if needed. --Continue follow-up with your primary care physician regarding the management of your other chronic comorbid conditions.  Patient's questions and concerns were addressed to his satisfaction. He voices understanding of the instructions provided during this encounter.   This note was created using a voice recognition software as a result there may be grammatical errors inadvertently enclosed that do not reflect the nature of this encounter. Every attempt is made to correct such errors.  SRex Kras DO, FWatkinsvilleCardiovascular. PPort WashingtonOffice: [phone]

## 2019-09-22 ENCOUNTER — Other Ambulatory Visit: Payer: Self-pay | Admitting: Cardiology

## 2019-09-22 ENCOUNTER — Telehealth: Payer: Self-pay

## 2019-09-22 DIAGNOSIS — I129 Hypertensive chronic kidney disease with stage 1 through stage 4 chronic kidney disease, or unspecified chronic kidney disease: Secondary | ICD-10-CM

## 2019-09-22 MED ORDER — AMLODIPINE BESYLATE 10 MG PO TABS: 10.0000 mg | ORAL_TABLET | Freq: Every evening | ORAL | 1 refills | Status: DC

## 2019-09-22 NOTE — Telephone Encounter (Signed)
Patient stated that his blood pressures were ranging between 150-160 mmHg.I asked him to increase his hydrochlorothiazide to 25 mg p.o. every morning.Please give him a call back to see how his blood pressures have been over the weekend.  If the blood pressures have improved and he is in the continue taking hydrochlorothiazide 25 mg p.o. every morning he needs to get his blood work done on 09/29/2019. ST

## 2019-09-22 NOTE — Telephone Encounter (Signed)
patient called and said his BP is still running hi even after making med changes. Can you please give him a call. Thank you

## 2019-09-25 ENCOUNTER — Other Ambulatory Visit: Payer: Self-pay | Admitting: Cardiology

## 2019-09-25 DIAGNOSIS — E782 Mixed hyperlipidemia: Secondary | ICD-10-CM | POA: Diagnosis not present

## 2019-09-25 DIAGNOSIS — I10 Essential (primary) hypertension: Secondary | ICD-10-CM | POA: Diagnosis not present

## 2019-09-25 DIAGNOSIS — I129 Hypertensive chronic kidney disease with stage 1 through stage 4 chronic kidney disease, or unspecified chronic kidney disease: Secondary | ICD-10-CM

## 2019-09-25 DIAGNOSIS — R0600 Dyspnea, unspecified: Secondary | ICD-10-CM | POA: Diagnosis not present

## 2019-09-26 LAB — LIPID PANEL WITH LDL/HDL RATIO
Cholesterol, Total: 139 mg/dL (ref 100–199)
HDL: 45 mg/dL (ref >39)
LDL Chol Calc (NIH): 72 mg/dL (ref 0–99)
LDL/HDL Ratio: 1.6 ratio (ref 0.0–3.6)
Triglycerides: 122 mg/dL (ref 0–149)
VLDL Cholesterol Cal: 22 mg/dL (ref 5–40)

## 2019-09-26 LAB — BASIC METABOLIC PANEL
BUN/Creatinine Ratio: 15 (ref 10–24)
BUN: 17 mg/dL (ref 8–27)
CO2: 24 mmol/L (ref 20–29)
Calcium: 9.7 mg/dL (ref 8.6–10.2)
Chloride: 100 mmol/L (ref 96–106)
Creatinine, Ser: 1.16 mg/dL (ref 0.76–1.27)
GFR calc Af Amer: 67 mL/min/{1.73_m2} (ref >59)
GFR calc non Af Amer: 58 mL/min/{1.73_m2} — ABNORMAL LOW (ref >59)
Glucose: 121 mg/dL — ABNORMAL HIGH (ref 65–99)
Potassium: 3.6 mmol/L (ref 3.5–5.2)
Sodium: 141 mmol/L (ref 134–144)

## 2019-09-26 LAB — MAGNESIUM: Magnesium: 2.2 mg/dL (ref 1.6–2.3)

## 2019-09-28 NOTE — Progress Notes (Signed)
Spoke with patient about results. Patient voiced understanding. Patient's blood pressure has been "very up and down" according to Patient. Blood pressures are ranging from 140/70 to 117/100. Patient states he "feels fine" Please advise. Thanks!

## 2019-09-29 ENCOUNTER — Telehealth: Payer: Self-pay

## 2019-09-29 NOTE — Telephone Encounter (Signed)
Patient is still waiting for some guidance about whether he should increase his hydrochlorothiazide. (See Tara's message) Please advise. Thanks!

## 2019-10-02 ENCOUNTER — Other Ambulatory Visit: Payer: Self-pay | Admitting: Cardiology

## 2019-10-02 DIAGNOSIS — I10 Essential (primary) hypertension: Secondary | ICD-10-CM

## 2019-10-02 MED ORDER — LISINOPRIL 10 MG PO TABS: 10.0000 mg | ORAL_TABLET | Freq: Every evening | ORAL | 0 refills | Status: DC

## 2019-10-02 NOTE — Telephone Encounter (Signed)
Spoke to the patient and added lisinopril 10mg  po qpm. Script sent.   Please call him in 2 days to see how is BP are doing and inform him that he needs repeat blood work to check his kidney function a week later.

## 2019-10-02 NOTE — Telephone Encounter (Signed)
Taken care off.

## 2019-10-02 NOTE — Telephone Encounter (Signed)
  155/79 1st reading: 10/01/2019  149/109 2nd reading : 10/01/2019  152/70 10/02/2019.  Patient has already had Labs drawn. He just wants to know if there is anything he should do a this point with those BP's and he also would like to know about his most recent lab results.

## 2019-10-03 ENCOUNTER — Other Ambulatory Visit: Payer: Self-pay | Admitting: Cardiology

## 2019-10-03 DIAGNOSIS — I10 Essential (primary) hypertension: Secondary | ICD-10-CM

## 2019-10-03 NOTE — Telephone Encounter (Signed)
Lab orders placed and patient is aware.  °

## 2019-10-03 NOTE — Telephone Encounter (Signed)
Double check I believe I placed the orders already.

## 2019-10-03 NOTE — Telephone Encounter (Signed)
I called patient back, he is aware that is 2 days we will be needing his BP readings, so he said that he will keep a log and he said that he will also go to [facility] on Thursday. Will you be putting in the orders. Please advise.

## 2019-10-05 DIAGNOSIS — N183 Chronic kidney disease, stage 3 unspecified: Secondary | ICD-10-CM | POA: Diagnosis not present

## 2019-10-05 DIAGNOSIS — I10 Essential (primary) hypertension: Secondary | ICD-10-CM | POA: Diagnosis not present

## 2019-10-05 DIAGNOSIS — I129 Hypertensive chronic kidney disease with stage 1 through stage 4 chronic kidney disease, or unspecified chronic kidney disease: Secondary | ICD-10-CM | POA: Diagnosis not present

## 2019-10-06 LAB — BASIC METABOLIC PANEL
BUN/Creatinine Ratio: 17 (ref 10–24)
BUN: 18 mg/dL (ref 8–27)
CO2: 22 mmol/L (ref 20–29)
Calcium: 9.9 mg/dL (ref 8.6–10.2)
Chloride: 100 mmol/L (ref 96–106)
Creatinine, Ser: 1.06 mg/dL (ref 0.76–1.27)
GFR calc Af Amer: 75 mL/min/{1.73_m2} (ref >59)
GFR calc non Af Amer: 65 mL/min/{1.73_m2} (ref >59)
Glucose: 115 mg/dL — ABNORMAL HIGH (ref 65–99)
Potassium: 4.4 mmol/L (ref 3.5–5.2)
Sodium: 140 mmol/L (ref 134–144)

## 2019-10-06 LAB — MAGNESIUM: Magnesium: 2.2 mg/dL (ref 1.6–2.3)

## 2019-10-09 ENCOUNTER — Telehealth: Payer: Self-pay

## 2019-10-09 NOTE — Telephone Encounter (Signed)
-----   Message from [place], [place] sent at 10/07/2019 11:55 AM EDT ----- Results reviewed. Serum potassium  are within normal limits. Kidney function is relatively at baseline. Continue current medical therapy. please ask how his blood pressures have been. Call if  questions arise

## 2019-10-09 NOTE — Telephone Encounter (Signed)
Spoke with patient about lab results. Patient voiced understanding. However, patient states that he believes his Lovastatin has been causes pain his his arms at night and discontinued taking the medication "a couple days ago" Patient would like to know if there is an alternative. Also, patient stated his blood pressures have been consistently around 140-80 in the mornings. Please advise. Thanks!

## 2019-10-10 ENCOUNTER — Other Ambulatory Visit: Payer: Self-pay | Admitting: Cardiology

## 2019-10-10 DIAGNOSIS — I10 Essential (primary) hypertension: Secondary | ICD-10-CM

## 2019-10-10 MED ORDER — LISINOPRIL 10 MG PO TABS: 10.0000 mg | ORAL_TABLET | Freq: Two times a day (BID) | ORAL | 0 refills | Status: DC

## 2019-10-10 NOTE — Telephone Encounter (Signed)
Spoke to the patient this morning.Patient's blood pressures are trending in the high limits of normal.  He is getting readings of 140 mmHg in the morning as well at night.  Patient stated that once he takes his evening dose of lisinopril and checks his blood pressure 30 minutes later he is noted his systolic blood pressures do improve in the range of 110 mmHg.I have asked him to increase his lisinopril to 10 mg p.o. twice daily.Patient is currently on lovastatin for hyperlipidemia.  He stopped taking them secondary to muscle aches in the arms.  He plans to restart that in a week after the muscle aches resolve.  He is welcome to call back if he had any questions.

## 2019-10-17 ENCOUNTER — Ambulatory Visit: Payer: PPO | Admitting: Cardiology

## 2019-10-17 ENCOUNTER — Other Ambulatory Visit: Payer: Self-pay | Admitting: Cardiology

## 2019-10-17 ENCOUNTER — Other Ambulatory Visit: Payer: Self-pay

## 2019-10-17 ENCOUNTER — Other Ambulatory Visit: Payer: PPO

## 2019-10-17 ENCOUNTER — Encounter: Payer: Self-pay | Admitting: Cardiology

## 2019-10-17 VITALS — BP 116/72 | HR 80 | Temp 97.8°F | Resp 15 | Ht 72.0 in | Wt 193.0 lb

## 2019-10-17 DIAGNOSIS — R0609 Other forms of dyspnea: Secondary | ICD-10-CM

## 2019-10-17 DIAGNOSIS — R42 Dizziness and giddiness: Secondary | ICD-10-CM

## 2019-10-17 DIAGNOSIS — N183 Chronic kidney disease, stage 3 unspecified: Secondary | ICD-10-CM | POA: Diagnosis not present

## 2019-10-17 DIAGNOSIS — Z87891 Personal history of nicotine dependence: Secondary | ICD-10-CM

## 2019-10-17 DIAGNOSIS — E782 Mixed hyperlipidemia: Secondary | ICD-10-CM | POA: Diagnosis not present

## 2019-10-17 DIAGNOSIS — I129 Hypertensive chronic kidney disease with stage 1 through stage 4 chronic kidney disease, or unspecified chronic kidney disease: Secondary | ICD-10-CM

## 2019-10-17 NOTE — Patient Instructions (Addendum)
Please remember to bring in your medication bottles in at the next visit.   New Medications that were added at today's visit:  None  Medications that were discontinued at today's visit: None  Office will call you to have the following tests scheduled:  Monitor Carotid Duplex  Patient is recommended to follow-up with his primary care provider for referral to sleep sleep medicine for possible sleep study given his symptoms of being tired, fatigue, dry mouth, and snoring.  In addition, he is recommended to follow-up for ultrasound of the abdomen.  Recommend follow up with your PCP as scheduled.

## 2019-10-17 NOTE — Progress Notes (Signed)
Joseph [name] Date of Birth: 1934/07/25 MRN: [id number] Primary Care Provider:Pharr, Thayer Jew, MD  Date: 10/17/19 Last Office Visit: 09/18/2019  Chief Complaint  Patient presents with  . Follow-up    4 week  . Dyspnea on exertion  . Results    HPI  Joseph [name] is a 85 y.o.  male who presents to the office with a chief complaint of " shortness of breath and blood pressure." Patient's past medical history and cardiovascular risk factors include: Hypertension, hyperlipidemia, advanced age,former smoker.  Patient was last seen via telemedicine on 09/18/2018 by me and at the last office visit we reviewed the results of the echocardiogram and nuclear stress test.  Also his blood pressures were not at goal and therefore was restarted on hydrochlorothiazide 12.5 mg p.o. daily and Norvasc was increased to 10 mg p.o. every afternoon.  Later patient called back stating that his blood pressures continue to be elevated but improved since initiation of hydrochlorothiazide.  At that time his hydrochlorothiazide was increased to 25 mg p.o. every morning.  Couple weeks later he called back stating his blood pressures are improving but not at goal.  He was started on lisinopril 10 mg p.o. every afternoon.  A week later his lisinopril was increased to 10 mg p.o. twice daily.  Today patient states that his blood pressures have improved. After he takes his meds his SBP are between 110-134mHg and DBP range between 66-840mg. Pulse is greater than 60bpm.    Dyspnea on exertion: Patient continues to have effort related dyspnea after he walks about 1 mile into his daily 1.5 miles of walking routine.  He denies any effort related chest pain.  Patient has undergone an echo and a nuclear stress test.  The symptoms are chronic and stable.  Patient continues to have symptoms despite improvement in blood pressures.  We discussed undergoing a monitor to evaluate for atrial fibrillation and or PVCs and/or other  arrhythmic burden.  If the symptoms continue despite a negative work-up patient may be considered for either left/right heart catheterization or cardiac CTA.  Hyperlipidemia: During one of the conversations patient states that he was having myalgias he did not know fluid secondary to exercising or statin medications.  He had stopped lovastatin for a week and a half and he will be restart today and will reevaluate his symptoms.  FUNCTIONAL STATUS: walks 1.5 miles on a daily basis.   ALLERGIES: Allergies  Allergen Reactions  . Ciprofloxacin Anaphylaxis and Other (See Comments)    Caused sores & blisters and a trip to the hospital- was also taking Aldara at the same time, however  . Ciprocin-Fluocin-Procin [Fluocinolone Acetonide] Other (See Comments)    Blisters and sores  . Fluocinolone Acetonide Other (See Comments)    Reaction not recalled  . Quinolones Other (See Comments)    Pt had to go to hospital (sores and blisters)  . Statins Other (See Comments)    Made the legs hurt  . Imiquimod Other (See Comments)    (Aldara) Caused sores that landed him in the hospital for 3 days- was taking Cipro at the same time, however  . Penicillins Itching, Rash and Other (See Comments)    Has patient had a PCN reaction causing immediate rash, facial/tongue/throat swelling, SOB or lightheadedness with hypotension: Yes Has patient had a PCN reaction causing severe rash involving mucus membranes or skin necrosis: No Has patient had a PCN reaction that required hospitalization: No Has patient had a PCN reaction occurring  within the last 10 years: No If all of the above answers are "NO", then may proceed with Cephalosporin use.      MEDICATION LIST PRIOR TO VISIT: Current Outpatient Medications on File Prior to Visit  Medication Sig Dispense Refill  . albuterol (PROAIR HFA) 108 (90 Base) MCG/ACT inhaler Inhale 2 puffs into the lungs every 6 (six) hours as needed for wheezing or shortness of breath.      Marland Kitchen amLODipine (NORVASC) 10 MG tablet Take 1 tablet (10 mg total) by mouth every evening. 90 tablet 1  . aspirin 81 MG chewable tablet Chew 81 mg by mouth daily.    . Dutasteride-Tamsulosin HCl (JALYN) 0.5-0.4 MG CAPS Take 1 capsule by mouth daily.     . hydrochlorothiazide (HYDRODIURIL) 25 MG tablet Take 25 mg by mouth in the morning.     Marland Kitchen levothyroxine (SYNTHROID) 137 MCG tablet Take 137 mcg by mouth daily before breakfast.    . lisinopril (ZESTRIL) 10 MG tablet Take 1 tablet (10 mg total) by mouth in the morning and at bedtime. 90 tablet 0  . magnesium oxide (MAG-OX) 400 MG tablet Take 400 mg by mouth daily.    . meloxicam (MOBIC) 7.5 MG tablet Take 7.5 mg by mouth daily as needed for pain.     . nitroGLYCERIN (NITROSTAT) 0.4 MG SL tablet Place 1 tablet (0.4 mg total) under the tongue every 5 (five) minutes as needed for chest pain. 30 tablet 3  . omeprazole (PRILOSEC) 40 MG capsule Take 40 mg by mouth daily before breakfast.    . umeclidinium-vilanterol (ANORO ELLIPTA) 62.5-25 MCG/INH AEPB Inhale 1 puff into the lungs daily.    Marland Kitchen lovastatin (MEVACOR) 40 MG tablet Take 40 mg by mouth daily.      No current facility-administered medications on file prior to visit.    PAST MEDICAL HISTORY: Past Medical History:  Diagnosis Date  . Arthritis   . COPD (chronic obstructive pulmonary disease) ([place])   . Dyspnea    on exertion  . Hyperlipidemia   . Hypertension   . Pneumothorax, spontaneous, tension 1960  . SOB (shortness of breath) on exertion     PAST SURGICAL HISTORY: Past Surgical History:  Procedure Laterality Date  . EYE SURGERY     bilateral cataract with lens implants  . LUNG SURGERY     40 years ago  . TOE SURGERY    . TOTAL KNEE ARTHROPLASTY Left 10/06/2017   Procedure: LEFT TOTAL KNEE ARTHROPLASTY;  Surgeon: Susa Day, MD;  Location: [facility];  Service: Orthopedics;  Laterality: Left;  Adductor Block    FAMILY HISTORY: The patient's family history is not on file.     SOCIAL HISTORY:  The patient  reports that he quit smoking about 60 years ago. His smoking use included cigarettes. He has a 5.00 pack-year smoking history. He has never used smokeless tobacco. He reports that he does not drink alcohol or use drugs.  Review of Systems  Constitution: Positive for malaise/fatigue. Negative for chills and fever.  HENT: Negative for ear discharge, ear pain and nosebleeds.   Eyes: Negative for blurred vision and discharge.  Cardiovascular: Positive for dyspnea on exertion. Negative for chest pain, claudication, leg swelling, near-syncope, orthopnea, palpitations, paroxysmal nocturnal dyspnea and syncope.  Respiratory: Positive for snoring. Negative for cough and shortness of breath.   Endocrine: Negative for polydipsia, polyphagia and polyuria.  Hematologic/Lymphatic: Negative for bleeding problem.  Skin: Negative for flushing and nail changes.  Musculoskeletal: Negative for muscle cramps,  muscle weakness and myalgias.  Gastrointestinal: Negative for abdominal pain, dysphagia, hematemesis, hematochezia, melena, nausea and vomiting.  Neurological: Positive for dizziness (change in position [place]). Negative for focal weakness and light-headedness.    PHYSICAL EXAM: Vitals with BMI 10/17/2019 09/18/2019 09/08/2019  Height '6\' 0"'  '6\' 0"'  -  Weight 193 lbs 185 lbs -  BMI 08.14 48.18 -  Systolic 563 149 702  Diastolic 72 90 73  Pulse 80 - 67   CONSTITUTIONAL: Well-developed and well-nourished. No acute distress.  SKIN: Skin is warm and dry. No rash noted. No cyanosis. No pallor. No jaundice HEAD: Normocephalic and atraumatic.  EYES: No scleral icterus MOUTH/THROAT: Moist oral membranes.  NECK: No JVD present. No thyromegaly noted. No carotid bruits  LYMPHATIC: No visible cervical adenopathy.  CHEST Normal respiratory effort. No intercostal retractions  LUNGS: Clear to auscultation bilaterally.  No stridor. No wheezes. No rales.  CARDIOVASCULAR: Regular rate and  rhythm, positive S1-S2, no murmurs rubs or gallops appreciated. ABDOMINAL: Soft, nontender, nondistended, positive bowel sounds in all 4 quadrants.  No apparent ascites.  EXTREMITIES: No peripheral edema  HEMATOLOGIC: No significant bruising NEUROLOGIC: Oriented to person, place, and time. Nonfocal. Normal muscle tone.  PSYCHIATRIC: Normal mood and affect. Normal behavior. Cooperative  EKG: 09/08/2019 0937: Normal sinus rhythm with a ventricular rate of 82 bpm, normal axis deviation, no underlying ischemia or injury pattern.  Echocardiogram: 02/01/2018: Left ventricle cavity is normal in size. Mild concentric hypertrophy of the left ventricle. Normal global wall motion. Doppler evidence of grade I (impaired) diastolic dysfunction, normal LAP. Calculated EF 52%. Mild tricuspid regurgitation. Estimated pulmonary artery systolic pressure 32 mmHg. Large liver cyst (7X7 cm) noted. Consider dedicated imaging, if clinically indicated.  09/2019: LVEF 55-60%, mild LVH, normal diastolic filling pressures, normal left atrial pressures, mildly dilated left atrium, mild MR, mild TR. Large liver cyst (6.9x7.8cm) noted. Consider dedicated imaging, if clinically indicated.  Stress Testing:  Exercise myoview stress 12/31/2017: 1. The resting electrocardiogram demonstrated normal sinus rhythm and normal resting conduction. Poor R wave progression. Stress EKG is negative for ischemia. Patient exercised on Bruce protocol for 5.00 minutes and achieved 5.72METS. Stress test terminated due to dyspnea and 89% MPHR achieved (Target HR >85%). 2. Myocardial perfusion imaging is normal. Overall left ventricular systolic function was normal without regional wall motion abnormalities. The left ventricular ejection fraction was 62%. This is a low risk study.  Lexiscan 09/11/2019: Nondiagnostic ECG stress. The baseline blood pressure was 80/54 mmHg and decreased to 78/54 mmHg at peak infusion, which is a physiologic response to  Intravenous Lexiscan. Patient asymptomatic. Resting EKG demonstrated normal sinus rhythm. Non-specific ST-T abnormality. Peak EKG revealed no significant ST-T change from baseline abnormality. Myocardial perfusion is normal. Stress LV EF: 52%. No previous exam available for comparison. Low risk study.   Heart Catheterization: None  Carotid Duplex: 05/2016: Minor carotid atherosclerosis. No hemodynamically significant ICA stenosis. Degree of narrowing less than 50% bilaterally.  Recent labs: 09/08/2019: Glucose 110, BUN/Cr 130.93 EGFR >60. Na/K 140/4.1.  H/H 15.6/48.8.   LABORATORY DATA: CBC Latest Ref Rng & Units 09/08/2019 06/09/2018 10/08/2017  WBC 4.0 - 10.5 K/uL 7.0 6.1 10.0  Hemoglobin 13.0 - 17.0 g/dL 15.6 14.6 11.5(L)  Hematocrit 39.0 - 52.0 % 48.8 44.4 35.0(L)  Platelets 150 - 400 K/uL 178 149 135(L)    CMP Latest Ref Rng & Units 10/05/2019 09/25/2019 09/08/2019  Glucose 65 - 99 mg/dL 115(H) 121(H) 110(H)  BUN 8 - 27 mg/dL '18 17 13  ' Creatinine 0.76 -  1.27 mg/dL 1.06 1.16 0.96  Sodium 134 - 144 mmol/L 140 141 140  Potassium 3.5 - 5.2 mmol/L 4.4 3.6 4.1  Chloride 96 - 106 mmol/L 100 100 108  CO2 20 - 29 mmol/L 22 24 14(L)  Calcium 8.6 - 10.2 mg/dL 9.9 9.7 9.7  Total Protein 6.1 - 8.1 g/dL - - -  Total Bilirubin 0.2 - 1.2 mg/dL - - -  Alkaline Phos 39 - 117 U/L - - -  AST 10 - 35 U/L - - -  ALT 9 - 46 U/L - - -    Lipid Panel     Component Value Date/Time   CHOL 139 09/25/2019 0813   TRIG 122 09/25/2019 0813   HDL 45 09/25/2019 0813   LDLCALC 72 09/25/2019 0813   LABVLDL 22 09/25/2019 0813   FINAL MEDICATION LIST END OF ENCOUNTER: No orders of the defined types were placed in this encounter.   There are no discontinued medications.   Current Outpatient Medications:  .  albuterol (PROAIR HFA) 108 (90 Base) MCG/ACT inhaler, Inhale 2 puffs into the lungs every 6 (six) hours as needed for wheezing or shortness of breath. , Disp: , Rfl:  .  amLODipine (NORVASC) 10 MG  tablet, Take 1 tablet (10 mg total) by mouth every evening., Disp: 90 tablet, Rfl: 1 .  aspirin 81 MG chewable tablet, Chew 81 mg by mouth daily., Disp: , Rfl:  .  Dutasteride-Tamsulosin HCl (JALYN) 0.5-0.4 MG CAPS, Take 1 capsule by mouth daily. , Disp: , Rfl:  .  hydrochlorothiazide (HYDRODIURIL) 25 MG tablet, Take 25 mg by mouth in the morning. , Disp: , Rfl:  .  levothyroxine (SYNTHROID) 137 MCG tablet, Take 137 mcg by mouth daily before breakfast., Disp: , Rfl:  .  lisinopril (ZESTRIL) 10 MG tablet, Take 1 tablet (10 mg total) by mouth in the morning and at bedtime., Disp: 90 tablet, Rfl: 0 .  magnesium oxide (MAG-OX) 400 MG tablet, Take 400 mg by mouth daily., Disp: , Rfl:  .  meloxicam (MOBIC) 7.5 MG tablet, Take 7.5 mg by mouth daily as needed for pain. , Disp: , Rfl:  .  nitroGLYCERIN (NITROSTAT) 0.4 MG SL tablet, Place 1 tablet (0.4 mg total) under the tongue every 5 (five) minutes as needed for chest pain., Disp: 30 tablet, Rfl: 3 .  omeprazole (PRILOSEC) 40 MG capsule, Take 40 mg by mouth daily before breakfast., Disp: , Rfl:  .  umeclidinium-vilanterol (ANORO ELLIPTA) 62.5-25 MCG/INH AEPB, Inhale 1 puff into the lungs daily., Disp: , Rfl:  .  lovastatin (MEVACOR) 40 MG tablet, Take 40 mg by mouth daily. , Disp: , Rfl:   IMPRESSION:    ICD-10-CM   1. Benign hypertension with CKD (chronic kidney disease) stage III  I12.9    N18.30   2. Mixed hyperlipidemia  E78.2   3. Dyspnea on exertion  R06.00   4. Former smoker  Z87.891   5. Dizziness  R42 EXTERNAL ECG MONITOR (UP TO 30 DAYS) ROCT TECHNICAL    PCV CAROTID DUPLEX (BILATERAL)     RECOMMENDATIONS: Joseph [name] is a 85 y.o. male whose past medical history and cardiovascular risk factors include: Hypertension, hyperlipidemia, advanced age,former smoker.  Benign essential hypertension: . Office blood pressure is at goal.  . Medication reconciled.  I took the liberty to decrease the number of pills that Joseph [name] was  taking. . Patient will be enrolled into principal care management for blood pressure monitoring. Marland Kitchen He is asked  to check his blood pressures after taking his morning medications. Marland Kitchen He is asked to keep a log of both blood pressure and pulse so that medications can be titrated based on a trend as opposed to isolated blood pressure readings in the office. . If the blood pressure is consistently greater than 125mHg patient is asked to call the office or his primary care provider's office for medication titration sooner than the next office visit.  . Low salt diet recommended. A diet that is rich in fruits, vegetables, legumes, and low-fat dairy products and low in snacks, sweets, and meats (such as the Dietary Approaches to Stop Hypertension [DASH] diet).   Dyspnea on exertion:  Patient has undergone an ischemic evaluation including an echo and nuclear stress test.  Findings noted above.  Recommend a 14-day mobile cardiac amatory telemetry to evaluate for underlying arrhythmic burden (i.e. PVC burden, atrial fibrillation, etc.)  We discussed that if his symptoms increase in intensity, frequency, and/or duration he will need to be seen sooner at the closest ER via EMS.  We also discussed possibly considering right/left heart catheterization or cardiac CTA to evaluate for underlying obstructive CAD given his chronic symptoms of shortness of breath with effort related activities.  Patient would like to hold off for now until the other work-up is complete.  Mixed hyperlipidemia:  Patient had held lovastatin for 1.5 weeks secondary to myalgias.  Patient states that he is back to baseline and will restart pharmacological therapy tonight  Continue to monitor for symptoms.  Dizziness: 14-day mobile cardiac ambulatory telemetry and carotid duplex to evaluate for carotid artery atherosclerosis.   Orders Placed This Encounter  Procedures  . EXTERNAL ECG MONITOR (UP TO 30 DAYS) ROCT TECHNICAL  . PCV  CAROTID DUPLEX (BILATERAL)   --Continue cardiac medications as reconciled in final medication list. --Return in about 3 months (around 01/16/2020) for Discussion of test results and re-evaluate symptoms. . Or sooner if needed. --Continue follow-up with your primary care physician regarding the management of your other chronic comorbid conditions.  Patient's questions and concerns were addressed to his satisfaction. He voices understanding of the instructions provided during this encounter.   During this visit I reviewed and updated: Tobacco history  allergies medication reconciliation  medical history  surgical history  family history  social history.  This note was created using a voice recognition software as a result there may be grammatical errors inadvertently enclosed that do not reflect the nature of this encounter. Every attempt is made to correct such errors.  SRex Kras DNevada [facility] Pager: [phone]: [phone]

## 2019-10-24 DIAGNOSIS — Z1212 Encounter for screening for malignant neoplasm of rectum: Secondary | ICD-10-CM | POA: Diagnosis not present

## 2019-10-24 DIAGNOSIS — Z1211 Encounter for screening for malignant neoplasm of colon: Secondary | ICD-10-CM | POA: Diagnosis not present

## 2019-10-25 ENCOUNTER — Other Ambulatory Visit: Payer: PPO

## 2019-10-30 ENCOUNTER — Telehealth: Payer: Self-pay

## 2019-10-30 NOTE — Telephone Encounter (Signed)
Patient called and said that he is to take Hydrochlorathiazide twice a day. I did not see that in your notes. Please advise.

## 2019-10-30 NOTE — Telephone Encounter (Signed)
Initially patient was on hydrochlorothiazide 12.5 mg p.o. daily.  This was increased to 25 mg p.o. daily.  So that he does not have to waste his medications I asked him to double up on 12.5 mg tablets so that it would equal 25 mg p.o. daily.  Agree, hydrochlorothiazide 25 mg p.o. every morning.

## 2019-10-31 ENCOUNTER — Other Ambulatory Visit: Payer: Self-pay

## 2019-10-31 DIAGNOSIS — R42 Dizziness and giddiness: Secondary | ICD-10-CM | POA: Diagnosis not present

## 2019-10-31 MED ORDER — HYDROCHLOROTHIAZIDE 25 MG PO TABS: 25.0000 mg | ORAL_TABLET | Freq: Every morning | ORAL | 0 refills | Status: DC

## 2019-11-03 DIAGNOSIS — I10 Essential (primary) hypertension: Secondary | ICD-10-CM | POA: Diagnosis not present

## 2019-11-04 ENCOUNTER — Encounter: Payer: Self-pay | Admitting: Cardiology

## 2019-11-06 DIAGNOSIS — I6529 Occlusion and stenosis of unspecified carotid artery: Secondary | ICD-10-CM | POA: Diagnosis not present

## 2019-11-06 DIAGNOSIS — I10 Essential (primary) hypertension: Secondary | ICD-10-CM | POA: Diagnosis not present

## 2019-11-06 DIAGNOSIS — R0789 Other chest pain: Secondary | ICD-10-CM | POA: Diagnosis not present

## 2019-11-06 DIAGNOSIS — I2510 Atherosclerotic heart disease of native coronary artery without angina pectoris: Secondary | ICD-10-CM | POA: Diagnosis not present

## 2019-11-06 DIAGNOSIS — I951 Orthostatic hypotension: Secondary | ICD-10-CM | POA: Diagnosis not present

## 2019-11-07 ENCOUNTER — Encounter: Payer: Self-pay | Admitting: Cardiology

## 2019-11-07 ENCOUNTER — Other Ambulatory Visit: Payer: Self-pay

## 2019-11-07 ENCOUNTER — Ambulatory Visit: Payer: PPO | Admitting: Cardiology

## 2019-11-07 VITALS — BP 123/65 | HR 80 | Resp 17 | Ht 72.0 in | Wt 188.0 lb

## 2019-11-07 DIAGNOSIS — R0609 Other forms of dyspnea: Secondary | ICD-10-CM | POA: Diagnosis not present

## 2019-11-07 DIAGNOSIS — Z87891 Personal history of nicotine dependence: Secondary | ICD-10-CM

## 2019-11-07 DIAGNOSIS — I129 Hypertensive chronic kidney disease with stage 1 through stage 4 chronic kidney disease, or unspecified chronic kidney disease: Secondary | ICD-10-CM | POA: Diagnosis not present

## 2019-11-07 DIAGNOSIS — R42 Dizziness and giddiness: Secondary | ICD-10-CM

## 2019-11-07 DIAGNOSIS — I209 Angina pectoris, unspecified: Secondary | ICD-10-CM | POA: Diagnosis not present

## 2019-11-07 DIAGNOSIS — E782 Mixed hyperlipidemia: Secondary | ICD-10-CM | POA: Diagnosis not present

## 2019-11-07 DIAGNOSIS — N183 Chronic kidney disease, stage 3 unspecified: Secondary | ICD-10-CM | POA: Diagnosis not present

## 2019-11-07 MED ORDER — AMLODIPINE BESYLATE 10 MG PO TABS: 5.0000 mg | ORAL_TABLET | Freq: Every evening | ORAL | 1 refills | Status: DC

## 2019-11-07 NOTE — H&P (View-Only) (Signed)
° °Joseph [name] °Date of Birth: 07/01/1935 °MRN: [id number] °Primary Care Provider:Pharr, Ranson, MD ° °Date: 11/07/19 °Last Office Visit: 10/30/2019 ° °Chief Complaint  °Patient presents with  °• Chest Pain  °• Dizziness  ° ° °HPI  °Joseph [name] is a 85 y.o.  male who presents to the office with a chief complaint of " chest pain and dizziness." Patient's past medical history and cardiovascular risk factors include: Hypertension, hyperlipidemia, advanced age, former smoker. ° °Patient presents to the office sooner than his scheduled appointment with a chief complaint of chest pain and dizziness at the request of his primary care provider.  He has been having more effort related chest pain when he goes out for walking on his daily routine.  The chest pain is located substernally, worse with effort related activities and improves with rest.  He is not required any sublingual nitroglycerin tablets for the discomfort.  And he has minimized his daily exercise routine for the last 1 or 2 days.  He denies any active chest pain at today's office visit.  In the past he has had chest discomfort for which he is undergone echocardiogram and nuclear stress test.  Results noted below for further reference.  His blood pressure was also elevated in the past and his antihypertensive medications have been uptitrated.  However, now he is orthostatic and his hydrochlorothiazide was discontinued by his primary care provider yesterday.  His orthostatics vital signs at today's office visit continued to be consistent with orthostatic hypotension.   ° °FUNCTIONAL STATUS: walks 1.5 miles on a daily basis.  °Heart failure °ALLERGIES: °Allergies  °Allergen Reactions  °• Ciprofloxacin Anaphylaxis and Other (See Comments)  °  Caused sores & blisters and a trip to the hospital- was also taking Aldara at the same time, however  °• Ciprocin-Fluocin-Procin [Fluocinolone Acetonide] Other (See Comments)  °  Blisters and sores  °• Fluocinolone  Acetonide Other (See Comments)  °  Reaction not recalled  °• Quinolones Other (See Comments)  °  Pt had to go to hospital (sores and blisters)  °• Statins Other (See Comments)  °  Made the legs hurt  °• Imiquimod Other (See Comments)  °  (Aldara) Caused sores that landed him in the hospital for 3 days- was taking Cipro at the same time, however  °• Penicillins Itching, Rash and Other (See Comments)  °  Has patient had a PCN reaction causing immediate rash, facial/tongue/throat swelling, SOB or lightheadedness with hypotension: Yes °Has patient had a PCN reaction causing severe rash involving mucus membranes or skin necrosis: No °Has patient had a PCN reaction that required hospitalization: No °Has patient had a PCN reaction occurring within the last 10 years: No °If all of the above answers are "NO", then may proceed with Cephalosporin use. °  ° ° ° °MEDICATION LIST PRIOR TO VISIT: °Current Outpatient Medications on File Prior to Visit  °Medication Sig Dispense Refill  °• aspirin 81 MG chewable tablet Chew 81 mg by mouth daily.    °• Dutasteride-Tamsulosin HCl (JALYN) 0.5-0.4 MG CAPS Take 1 capsule by mouth daily.     °• levothyroxine (SYNTHROID) 137 MCG tablet Take 137 mcg by mouth daily before breakfast. Was told on 11/06/19 by PCP not to take    °• lisinopril (ZESTRIL) 10 MG tablet Take 1 tablet (10 mg total) by mouth in the morning and at bedtime. 90 tablet 0  °• lovastatin (MEVACOR) 40 MG tablet Take 40 mg by mouth daily.     °•   magnesium oxide (MAG-OX) 400 MG tablet Take 400 mg by mouth daily.    °• meloxicam (MOBIC) 7.5 MG tablet Take 7.5 mg by mouth daily as needed for pain.     °• nitroGLYCERIN (NITROSTAT) 0.4 MG SL tablet Place 1 tablet (0.4 mg total) under the tongue every 5 (five) minutes as needed for chest pain. 30 tablet 3  °• omeprazole (PRILOSEC) 40 MG capsule Take 40 mg by mouth daily before breakfast.    °• umeclidinium-vilanterol (ANORO ELLIPTA) 62.5-25 MCG/INH AEPB Inhale 1 puff into the lungs  daily.    °• albuterol (PROAIR HFA) 108 (90 Base) MCG/ACT inhaler Inhale 2 puffs into the lungs every 6 (six) hours as needed for wheezing or shortness of breath.     ° °No current facility-administered medications on file prior to visit.  ° ° °PAST MEDICAL HISTORY: °Past Medical History:  °Diagnosis Date  °• Arthritis   °• COPD (chronic obstructive pulmonary disease) (HCC)   °• Dyspnea   ° on exertion  °• Hyperlipidemia   °• Hypertension   °• Pneumothorax, spontaneous, tension 1960  °• SOB (shortness of breath) on exertion   ° ° °PAST SURGICAL HISTORY: °Past Surgical History:  °Procedure Laterality Date  °• EYE SURGERY    ° bilateral cataract with lens implants  °• LUNG SURGERY    ° 40 years ago  °• TOE SURGERY    °• TOTAL KNEE ARTHROPLASTY Left 10/06/2017  ° Procedure: LEFT TOTAL KNEE ARTHROPLASTY;  Surgeon: Beane, Jeffrey, MD;  Location: [facility];  Service: Orthopedics;  Laterality: Left;  Adductor Block  ° ° °FAMILY HISTORY: °The patient's family history is not on file. °  °SOCIAL HISTORY:  °The patient  reports that he quit smoking about 60 years ago. His smoking use included cigarettes. He has a 5.00 pack-year smoking history. He has never used smokeless tobacco. He reports that he does not drink alcohol or use drugs. ° °Review of Systems  °Constitution: Positive for malaise/fatigue. Negative for chills and fever.  °HENT: Negative for ear discharge, ear pain and nosebleeds.   °Eyes: Negative for blurred vision and discharge.  °Cardiovascular: Positive for chest pain and dyspnea on exertion. Negative for claudication, leg swelling, near-syncope, orthopnea, palpitations, paroxysmal nocturnal dyspnea and syncope.  °Respiratory: Positive for snoring. Negative for cough and shortness of breath.   °Endocrine: Negative for polydipsia, polyphagia and polyuria.  °Hematologic/Lymphatic: Negative for bleeding problem.  °Skin: Negative for flushing and nail changes.  °Musculoskeletal: Negative for muscle cramps, muscle  weakness and myalgias.  °Gastrointestinal: Negative for abdominal pain, dysphagia, hematemesis, hematochezia, melena, nausea and vomiting.  °Neurological: Positive for dizziness (change in position quicky). Negative for focal weakness and light-headedness.  ° ° °PHYSICAL EXAM: °Vitals with BMI 11/07/2019 10/17/2019 09/18/2019  °Height 6' 0" 6' 0" 6' 0"  °Weight 188 lbs 193 lbs 185 lbs  °BMI 25.49 26.17 25.08  °Systolic 123 116 175  °Diastolic 65 72 90  °Pulse 80 80 -  ° °Orthostatic VS for the past 72 hrs (Last 3 readings): ° Orthostatic BP Patient Position BP Location Cuff Size Orthostatic Pulse  °11/07/19 1458 93/54 Standing Left Arm Normal 85  °11/07/19 1457 125/69 Sitting Left Arm Normal 73  °11/07/19 1456 136/68 Supine Left Arm Normal 67  ° ° °CONSTITUTIONAL: Well-developed and well-nourished. No acute distress.  °SKIN: Skin is warm and dry. No rash noted. No cyanosis. No pallor. No jaundice °HEAD: Normocephalic and atraumatic.  °EYES: No scleral icterus °MOUTH/THROAT: Moist oral membranes.  °NECK: No JVD   present. No thyromegaly noted. No carotid bruits  LYMPHATIC: No visible cervical adenopathy.  CHEST Normal respiratory effort. No intercostal retractions  LUNGS: Clear to auscultation bilaterally.  No stridor. No wheezes. No rales.  CARDIOVASCULAR: Regular rate and rhythm, positive S1-S2, no murmurs rubs or gallops appreciated. ABDOMINAL: Soft, nontender, nondistended, positive bowel sounds in all 4 quadrants.  No apparent ascites.  EXTREMITIES: No peripheral edema, compression stockings present. HEMATOLOGIC: No significant bruising NEUROLOGIC: Oriented to person, place, and time. Nonfocal. Normal muscle tone.  PSYCHIATRIC: Normal mood and affect. Normal behavior. Cooperative  EKG: 09/08/2019 0937: Normal sinus rhythm with a ventricular rate of 82 bpm, normal axis deviation, no underlying ischemia or injury pattern. 45/10/2019: Normal sinus rhythm, 72 bpm, normal axis, without underlying ischemia or  injury pattern.  Echocardiogram: 09/2019: LVEF 55-60%, mild LVH, normal diastolic filling pressures, normal left atrial pressures, mildly dilated left atrium, mild MR, mild TR. Large liver cyst (6.9x7.8cm) noted. Consider dedicated imaging, if clinically indicated.  Stress Testing:  Lexiscan 09/11/2019: Nondiagnostic ECG stress. The baseline blood pressure was 80/54 mmHg and decreased to 78/54 mmHg at peak infusion, which is a physiologic response to Intravenous Lexiscan. Patient asymptomatic. Resting EKG demonstrated normal sinus rhythm. Non-specific ST-T abnormality. Peak EKG revealed no significant ST-T change from baseline abnormality. Myocardial perfusion is normal. Stress LV EF: 52%. No previous exam available for comparison. Low risk study.   Heart Catheterization: None  Carotid Duplex: 05/2016: Minor carotid atherosclerosis. No hemodynamically significant ICA stenosis. Degree of narrowing less than 50% bilaterally.  Recent labs: 09/08/2019: Glucose 110, BUN/Cr 130.93 EGFR >60. Na/K 140/4.1.  H/H 15.6/48.8.   LABORATORY DATA: CBC Latest Ref Rng & Units 09/08/2019 06/09/2018 10/08/2017  WBC 4.0 - 10.5 K/uL 7.0 6.1 10.0  Hemoglobin 13.0 - 17.0 g/dL 15.6 14.6 11.5(L)  Hematocrit 39.0 - 52.0 % 48.8 44.4 35.0(L)  Platelets 150 - 400 K/uL 178 149 135(L)    CMP Latest Ref Rng & Units 10/05/2019 09/25/2019 09/08/2019  Glucose 65 - 99 mg/dL 115(H) 121(H) 110(H)  BUN 8 - 27 mg/dL _0 Creatinine 0.76 - 1.27 mg/dL 1.06 1.16 0.96  Sodium 134 - 144 mmol/L 140 141 140  Potassium 3.5 - 5.2 mmol/L 4.4 3.6 4.1  Chloride 96 - 106 mmol/L 100 100 108  CO2 20 - 29 mmol/L 22 24 14(L)  Calcium 8.6 - 10.2 mg/dL 9.9 9.7 9.7  Total Protein 6.1 - 8.1 g/dL - - -  Total Bilirubin 0.2 - 1.2 mg/dL - - -  Alkaline Phos 39 - 117 U/L - - -  AST 10 - 35 U/L - - -  ALT 9 - 46 U/L - - -    Lipid Panel     Component Value Date/Time   CHOL 139 09/25/2019 0813   TRIG 122 09/25/2019 0813   HDL 45  09/25/2019 0813   LDLCALC 72 09/25/2019 0813   LABVLDL 22 09/25/2019 0813   FINAL MEDICATION LIST END OF ENCOUNTER: Meds ordered this encounter  Medications  . amLODipine (NORVASC) 10 MG tablet    Sig: Take 0.5 tablets (5 mg total) by mouth every evening.    Dispense:  90 tablet    Refill:  1    Medications Discontinued During This Encounter  Medication Reason  . amLODipine (NORVASC) 10 MG tablet   . hydrochlorothiazide (HYDRODIURIL) 25 MG tablet Patient Preference     Current Outpatient Medications:  .  amLODipine (NORVASC) 10 MG tablet, Take 0.5 tablets (5 mg total) by mouth every  evening., Disp: 90 tablet, Rfl: 1 °•  aspirin 81 MG chewable tablet, Chew 81 mg by mouth daily., Disp: , Rfl:  °•  Dutasteride-Tamsulosin HCl (JALYN) 0.5-0.4 MG CAPS, Take 1 capsule by mouth daily. , Disp: , Rfl:  °•  levothyroxine (SYNTHROID) 137 MCG tablet, Take 137 mcg by mouth daily before breakfast. Was told on 11/06/19 by PCP not to take, Disp: , Rfl:  °•  lisinopril (ZESTRIL) 10 MG tablet, Take 1 tablet (10 mg total) by mouth in the morning and at bedtime., Disp: 90 tablet, Rfl: 0 °•  lovastatin (MEVACOR) 40 MG tablet, Take 40 mg by mouth daily. , Disp: , Rfl:  °•  magnesium oxide (MAG-OX) 400 MG tablet, Take 400 mg by mouth daily., Disp: , Rfl:  °•  meloxicam (MOBIC) 7.5 MG tablet, Take 7.5 mg by mouth daily as needed for pain. , Disp: , Rfl:  °•  nitroGLYCERIN (NITROSTAT) 0.4 MG SL tablet, Place 1 tablet (0.4 mg total) under the tongue every 5 (five) minutes as needed for chest pain., Disp: 30 tablet, Rfl: 3 °•  omeprazole (PRILOSEC) 40 MG capsule, Take 40 mg by mouth daily before breakfast., Disp: , Rfl:  °•  umeclidinium-vilanterol (ANORO ELLIPTA) 62.5-25 MCG/INH AEPB, Inhale 1 puff into the lungs daily., Disp: , Rfl:  °•  albuterol (PROAIR HFA) 108 (90 Base) MCG/ACT inhaler, Inhale 2 puffs into the lungs every 6 (six) hours as needed for wheezing or shortness of breath. , Disp: , Rfl:  ° °IMPRESSION: ° °   ICD-10-CM   °1. Angina pectoris (HCC)  I20.9 EKG 12-Lead  °2. Dizziness  R42 EKG 12-Lead  °  PCV CAROTID DUPLEX (BILATERAL)  °3. Benign hypertension with CKD (chronic kidney disease) stage III  I12.9   ° N18.30   °4. Mixed hyperlipidemia  E78.2   °5. Dyspnea on exertion  R06.00   °6. Former smoker  Z87.891   °  ° °RECOMMENDATIONS: °Joseph [name] is a 85 y.o. male whose past medical history and cardiovascular risk factors include: Hypertension, hyperlipidemia, advanced age, former smoker. ° °Angina pectoris: °· Patient symptoms of chest discomfort is concerning for angina pectoris.  In the past we have discussed undergoing left heart catheterization or cardiac CTA.  However, given his symptoms of effort related chest pain and shortness of breath he would like to proceed with left heart catheterization. °· The left heart catheterization procedure was explained to the patient in detail. The indication, alternatives, risks and benefits were reviewed. Complications including but not limited to bleeding, infection, acute kidney injury, blood transfusion, heart rhythm disturbances, contrast (dye) reaction, damage to the arteries or nerves in the legs or hands, cerebrovascular accident, myocardial infarction, need for emergent bypass surgery, blood clots in the legs, possible need for emergent blood transfusion, and rarely death were reviewed and discussed with the patient. The patient voices understanding and wishes to proceed.  °· EKG does not show underlying ischemia or injury pattern. °· Patient is asked to decrease his physical activity until his left heart catheterization is complete. °· In the interim, patient verbalized understanding that his symptoms increase in intensity, frequency, and/or duration he should seek medical attention at the closest ER via EMS. ° °Orthostatic hypotension: °· His orthostatic vital signs are positive. °· He stopped taking his hydrochlorothiazide starting this morning at the request  of his primary care physician due to orthostasis, I agree.  °· We will also further decrease his Norvasc to 5 mg p.o. daily. °· Patient is encouraged to   change positions slowly.  Dizziness: Most likely secondary to underlying orthostatic hypotension.  But this has been a chronic issue for the patient even when he was not orthostatic.  Check carotid duplex to rule out carotid artery atherosclerosis.   Total encounter time 45 minutes. Total Encounter Time as defined by the Centers for Medicare and Medicaid Services includes, in addition to the face-to-face time of a patient visit (documented in the note above) non-face-to-face time: obtaining and reviewing outside history, ordering and reviewing medications, tests or procedures, care coordination (communications with other health care professionals or caregivers) and documentation in the medical record.   Orders Placed This Encounter  Procedures  . EKG 12-Lead  . PCV CAROTID DUPLEX (BILATERAL)   --Continue cardiac medications as reconciled in final medication list. --Return for post heart cath. . Or sooner if needed. --Continue follow-up with your primary care physician regarding the management of your other chronic comorbid conditions.  Patient's questions and concerns were addressed to his satisfaction. He voices understanding of the instructions provided during this encounter.   This note was created using a voice recognition software as a result there may be grammatical errors inadvertently enclosed that do not reflect the nature of this encounter. Every attempt is made to correct such errors.  Rex Kras, [place], [facility]  Pager: [phone] Office: [phone]

## 2019-11-07 NOTE — Progress Notes (Signed)
Joseph [name] Date of Birth: 12/27/1934 MRN: [id number] Primary Care Provider:Pharr, Thayer Jew, MD  Date: 11/07/19 Last Office Visit: 10/30/2019  Chief Complaint  Patient presents with   Chest Pain   Dizziness    HPI  Joseph [name] is a 85 y.o.  male who presents to the office with a chief complaint of " chest pain and dizziness." Patient's past medical history and cardiovascular risk factors include: Hypertension, hyperlipidemia, advanced age,former smoker.  Patient presents to the office sooner than his scheduled appointment with a chief complaint of chest pain and dizziness at the request of his primary care provider.  He has been having more effort related chest pain when he goes out for walking on his daily routine.  The chest pain is located substernally, worse with effort related activities and improves with rest.  He is not required any sublingual nitroglycerin tablets for the discomfort.  And he has minimized his daily exercise routine for the last 1 or 2 days.  He denies any active chest pain at today's office visit.  In the past he has had chest discomfort for which he is undergone echocardiogram and nuclear stress test.  Results noted below for further reference.  His blood pressure was also elevated in the past and his antihypertensive medications have been uptitrated.  However, now he is orthostatic and his hydrochlorothiazide was discontinued by his primary care provider yesterday.  His orthostatics vital signs at today's office visit continued to be consistent with orthostatic hypotension.    FUNCTIONAL STATUS: walks 1.5 miles on a daily basis.  Heart failure ALLERGIES: Allergies  Allergen Reactions   Ciprofloxacin Anaphylaxis and Other (See Comments)    Caused sores & blisters and a trip to the hospital- was also taking Aldara at the same time, however   Ciprocin-Fluocin-Procin [Fluocinolone Acetonide] Other (See Comments)    Blisters and sores   Fluocinolone  Acetonide Other (See Comments)    Reaction not recalled   Quinolones Other (See Comments)    Pt had to go to hospital (sores and blisters)   Statins Other (See Comments)    Made the legs hurt   Imiquimod Other (See Comments)    (Aldara) Caused sores that landed him in the hospital for 3 days- was taking Cipro at the same time, however   Penicillins Itching, Rash and Other (See Comments)    Has patient had a PCN reaction causing immediate rash, facial/tongue/throat swelling, SOB or lightheadedness with hypotension: Yes Has patient had a PCN reaction causing severe rash involving mucus membranes or skin necrosis: No Has patient had a PCN reaction that required hospitalization: No Has patient had a PCN reaction occurring within the last 10 years: No If all of the above answers are "NO", then may proceed with Cephalosporin use.      MEDICATION LIST PRIOR TO VISIT: Current Outpatient Medications on File Prior to Visit  Medication Sig Dispense Refill   aspirin 81 MG chewable tablet Chew 81 mg by mouth daily.     Dutasteride-Tamsulosin HCl (JALYN) 0.5-0.4 MG CAPS Take 1 capsule by mouth daily.      levothyroxine (SYNTHROID) 137 MCG tablet Take 137 mcg by mouth daily before breakfast. Was told on 11/06/19 by PCP not to take     lisinopril (ZESTRIL) 10 MG tablet Take 1 tablet (10 mg total) by mouth in the morning and at bedtime. 90 tablet 0   lovastatin (MEVACOR) 40 MG tablet Take 40 mg by mouth daily.  magnesium oxide (MAG-OX) 400 MG tablet Take 400 mg by mouth daily.     meloxicam (MOBIC) 7.5 MG tablet Take 7.5 mg by mouth daily as needed for pain.      nitroGLYCERIN (NITROSTAT) 0.4 MG SL tablet Place 1 tablet (0.4 mg total) under the tongue every 5 (five) minutes as needed for chest pain. 30 tablet 3   omeprazole (PRILOSEC) 40 MG capsule Take 40 mg by mouth daily before breakfast.     umeclidinium-vilanterol (ANORO ELLIPTA) 62.5-25 MCG/INH AEPB Inhale 1 puff into the lungs  daily.     albuterol (PROAIR HFA) 108 (90 Base) MCG/ACT inhaler Inhale 2 puffs into the lungs every 6 (six) hours as needed for wheezing or shortness of breath.      No current facility-administered medications on file prior to visit.    PAST MEDICAL HISTORY: Past Medical History:  Diagnosis Date   Arthritis    COPD (chronic obstructive pulmonary disease) ([place])    Dyspnea    on exertion   Hyperlipidemia    Hypertension    Pneumothorax, spontaneous, tension 1960   SOB (shortness of breath) on exertion     PAST SURGICAL HISTORY: Past Surgical History:  Procedure Laterality Date   EYE SURGERY     bilateral cataract with lens implants   LUNG SURGERY     40 years ago   TOE SURGERY     TOTAL KNEE ARTHROPLASTY Left 10/06/2017   Procedure: LEFT TOTAL KNEE ARTHROPLASTY;  Surgeon: Susa Day, MD;  Location: [facility];  Service: Orthopedics;  Laterality: Left;  Adductor Block    FAMILY HISTORY: The patient's family history is not on file.   SOCIAL HISTORY:  The patient  reports that he quit smoking about 60 years ago. His smoking use included cigarettes. He has a 5.00 pack-year smoking history. He has never used smokeless tobacco. He reports that he does not drink alcohol or use drugs.  Review of Systems  Constitution: Positive for malaise/fatigue. Negative for chills and fever.  HENT: Negative for ear discharge, ear pain and nosebleeds.   Eyes: Negative for blurred vision and discharge.  Cardiovascular: Positive for chest pain and dyspnea on exertion. Negative for claudication, leg swelling, near-syncope, orthopnea, palpitations, paroxysmal nocturnal dyspnea and syncope.  Respiratory: Positive for snoring. Negative for cough and shortness of breath.   Endocrine: Negative for polydipsia, polyphagia and polyuria.  Hematologic/Lymphatic: Negative for bleeding problem.  Skin: Negative for flushing and nail changes.  Musculoskeletal: Negative for muscle cramps, muscle  weakness and myalgias.  Gastrointestinal: Negative for abdominal pain, dysphagia, hematemesis, hematochezia, melena, nausea and vomiting.  Neurological: Positive for dizziness (change in position Rena Lara). Negative for focal weakness and light-headedness.    PHYSICAL EXAM: Vitals with BMI 11/07/2019 10/17/2019 09/18/2019  Height 6' 0" 6' 0" 6' 0"  Weight 188 lbs 193 lbs 185 lbs  BMI 25.49 81.85 63.14  Systolic 970 263 785  Diastolic 65 72 90  Pulse 80 80 -   Orthostatic VS for the past 72 hrs (Last 3 readings):  Orthostatic BP Patient Position BP Location Cuff Size Orthostatic Pulse  11/07/19 1458 93/54 Standing Left Arm Normal 85  11/07/19 1457 125/69 Sitting Left Arm Normal 73  11/07/19 1456 136/68 Supine Left Arm Normal 67    CONSTITUTIONAL: Well-developed and well-nourished. No acute distress.  SKIN: Skin is warm and dry. No rash noted. No cyanosis. No pallor. No jaundice HEAD: Normocephalic and atraumatic.  EYES: No scleral icterus MOUTH/THROAT: Moist oral membranes.  NECK: No JVD  present. No thyromegaly noted. No carotid bruits  LYMPHATIC: No visible cervical adenopathy.  CHEST Normal respiratory effort. No intercostal retractions  LUNGS: Clear to auscultation bilaterally.  No stridor. No wheezes. No rales.  CARDIOVASCULAR: Regular rate and rhythm, positive S1-S2, no murmurs rubs or gallops appreciated. ABDOMINAL: Soft, nontender, nondistended, positive bowel sounds in all 4 quadrants.  No apparent ascites.  EXTREMITIES: No peripheral edema, compression stockings present. HEMATOLOGIC: No significant bruising NEUROLOGIC: Oriented to person, place, and time. Nonfocal. Normal muscle tone.  PSYCHIATRIC: Normal mood and affect. Normal behavior. Cooperative  EKG: 09/08/2019 0937: Normal sinus rhythm with a ventricular rate of 82 bpm, normal axis deviation, no underlying ischemia or injury pattern. 45/10/2019: Normal sinus rhythm, 72 bpm, normal axis, without underlying ischemia or  injury pattern.  Echocardiogram: 09/2019: LVEF 55-60%, mild LVH, normal diastolic filling pressures, normal left atrial pressures, mildly dilated left atrium, mild MR, mild TR. Large liver cyst (6.9x7.8cm) noted. Consider dedicated imaging, if clinically indicated.  Stress Testing:  Lexiscan 09/11/2019: Nondiagnostic ECG stress. The baseline blood pressure was 80/54 mmHg and decreased to 78/54 mmHg at peak infusion, which is a physiologic response to Intravenous Lexiscan. Patient asymptomatic. Resting EKG demonstrated normal sinus rhythm. Non-specific ST-T abnormality. Peak EKG revealed no significant ST-T change from baseline abnormality. Myocardial perfusion is normal. Stress LV EF: 52%. No previous exam available for comparison. Low risk study.   Heart Catheterization: None  Carotid Duplex: 05/2016: Minor carotid atherosclerosis. No hemodynamically significant ICA stenosis. Degree of narrowing less than 50% bilaterally.  Recent labs: 09/08/2019: Glucose 110, BUN/Cr 130.93 EGFR >60. Na/K 140/4.1.  H/H 15.6/48.8.   LABORATORY DATA: CBC Latest Ref Rng & Units 09/08/2019 06/09/2018 10/08/2017  WBC 4.0 - 10.5 K/uL 7.0 6.1 10.0  Hemoglobin 13.0 - 17.0 g/dL 15.6 14.6 11.5(L)  Hematocrit 39.0 - 52.0 % 48.8 44.4 35.0(L)  Platelets 150 - 400 K/uL 178 149 135(L)    CMP Latest Ref Rng & Units 10/05/2019 09/25/2019 09/08/2019  Glucose 65 - 99 mg/dL 115(H) 121(H) 110(H)  BUN 8 - 27 mg/dL _0 Creatinine 0.76 - 1.27 mg/dL 1.06 1.16 0.96  Sodium 134 - 144 mmol/L 140 141 140  Potassium 3.5 - 5.2 mmol/L 4.4 3.6 4.1  Chloride 96 - 106 mmol/L 100 100 108  CO2 20 - 29 mmol/L 22 24 14(L)  Calcium 8.6 - 10.2 mg/dL 9.9 9.7 9.7  Total Protein 6.1 - 8.1 g/dL - - -  Total Bilirubin 0.2 - 1.2 mg/dL - - -  Alkaline Phos 39 - 117 U/L - - -  AST 10 - 35 U/L - - -  ALT 9 - 46 U/L - - -    Lipid Panel     Component Value Date/Time   CHOL 139 09/25/2019 0813   TRIG 122 09/25/2019 0813   HDL 45  09/25/2019 0813   LDLCALC 72 09/25/2019 0813   LABVLDL 22 09/25/2019 0813   FINAL MEDICATION LIST END OF ENCOUNTER: Meds ordered this encounter  Medications   amLODipine (NORVASC) 10 MG tablet    Sig: Take 0.5 tablets (5 mg total) by mouth every evening.    Dispense:  90 tablet    Refill:  1    Medications Discontinued During This Encounter  Medication Reason   amLODipine (NORVASC) 10 MG tablet    hydrochlorothiazide (HYDRODIURIL) 25 MG tablet Patient Preference     Current Outpatient Medications:    amLODipine (NORVASC) 10 MG tablet, Take 0.5 tablets (5 mg total) by mouth every  evening., Disp: 90 tablet, Rfl: 1   aspirin 81 MG chewable tablet, Chew 81 mg by mouth daily., Disp: , Rfl:    Dutasteride-Tamsulosin HCl (JALYN) 0.5-0.4 MG CAPS, Take 1 capsule by mouth daily. , Disp: , Rfl:    levothyroxine (SYNTHROID) 137 MCG tablet, Take 137 mcg by mouth daily before breakfast. Was told on 11/06/19 by PCP not to take, Disp: , Rfl:    lisinopril (ZESTRIL) 10 MG tablet, Take 1 tablet (10 mg total) by mouth in the morning and at bedtime., Disp: 90 tablet, Rfl: 0   lovastatin (MEVACOR) 40 MG tablet, Take 40 mg by mouth daily. , Disp: , Rfl:    magnesium oxide (MAG-OX) 400 MG tablet, Take 400 mg by mouth daily., Disp: , Rfl:    meloxicam (MOBIC) 7.5 MG tablet, Take 7.5 mg by mouth daily as needed for pain. , Disp: , Rfl:    nitroGLYCERIN (NITROSTAT) 0.4 MG SL tablet, Place 1 tablet (0.4 mg total) under the tongue every 5 (five) minutes as needed for chest pain., Disp: 30 tablet, Rfl: 3   omeprazole (PRILOSEC) 40 MG capsule, Take 40 mg by mouth daily before breakfast., Disp: , Rfl:    umeclidinium-vilanterol (ANORO ELLIPTA) 62.5-25 MCG/INH AEPB, Inhale 1 puff into the lungs daily., Disp: , Rfl:    albuterol (PROAIR HFA) 108 (90 Base) MCG/ACT inhaler, Inhale 2 puffs into the lungs every 6 (six) hours as needed for wheezing or shortness of breath. , Disp: , Rfl:   IMPRESSION:     ICD-10-CM   1. Angina pectoris ([place])  I20.9 EKG 12-Lead  2. Dizziness  R42 EKG 12-Lead    PCV CAROTID DUPLEX (BILATERAL)  3. Benign hypertension with CKD (chronic kidney disease) stage III  I12.9    N18.30   4. Mixed hyperlipidemia  E78.2   5. Dyspnea on exertion  R06.00   6. Former smoker  Z87.891      RECOMMENDATIONS: INMER NIX is a 85 y.o. male whose past medical history and cardiovascular risk factors include: Hypertension, hyperlipidemia, advanced age,former smoker.  Angina pectoris:  Patient symptoms of chest discomfort is concerning for angina pectoris.  In the past we have discussed undergoing left heart catheterization or cardiac CTA.  However, given his symptoms of effort related chest pain and shortness of breath he would like to proceed with left heart catheterization.  The left heart catheterization procedure was explained to the patient in detail. The indication, alternatives, risks and benefits were reviewed. Complications including but not limited to bleeding, infection, acute kidney injury, blood transfusion, heart rhythm disturbances, contrast (dye) reaction, damage to the arteries or nerves in the legs or hands, cerebrovascular accident, myocardial infarction, need for emergent bypass surgery, blood clots in the legs, possible need for emergent blood transfusion, and rarely death were reviewed and discussed with the patient. The patient voices understanding and wishes to proceed.   EKG does not show underlying ischemia or injury pattern.  Patient is asked to decrease his physical activity until his left heart catheterization is complete.  In the interim, patient verbalized understanding that his symptoms increase in intensity, frequency, and/or duration he should seek medical attention at the closest ER via EMS.  Orthostatic hypotension:  His orthostatic vital signs are positive.  He stopped taking his hydrochlorothiazide starting this morning at the request  of his primary care physician due to orthostasis, I agree.   We will also further decrease his Norvasc to 5 mg p.o. daily.  Patient is encouraged to  change positions slowly.  Dizziness: Most likely secondary to underlying orthostatic hypotension.  But this has been a chronic issue for the patient even when he was not orthostatic.  Check carotid duplex to rule out carotid artery atherosclerosis.   Total encounter time 45 minutes. Total Encounter Time as defined by the Centers for Medicare and Medicaid Services includes, in addition to the face-to-face time of a patient visit (documented in the note above) non-face-to-face time: obtaining and reviewing outside history, ordering and reviewing medications, tests or procedures, care coordination (communications with other health care professionals or caregivers) and documentation in the medical record.   Orders Placed This Encounter  Procedures   EKG 12-Lead   PCV CAROTID DUPLEX (BILATERAL)   --Continue cardiac medications as reconciled in final medication list. --Return for post heart cath. . Or sooner if needed. --Continue follow-up with your primary care physician regarding the management of your other chronic comorbid conditions.  Patient's questions and concerns were addressed to his satisfaction. He voices understanding of the instructions provided during this encounter.   This note was created using a voice recognition software as a result there may be grammatical errors inadvertently enclosed that do not reflect the nature of this encounter. Every attempt is made to correct such errors.  Rex Kras, [place], [facility]  Pager: [phone] Office: [phone]

## 2019-11-08 DIAGNOSIS — K219 Gastro-esophageal reflux disease without esophagitis: Secondary | ICD-10-CM | POA: Diagnosis not present

## 2019-11-08 DIAGNOSIS — J449 Chronic obstructive pulmonary disease, unspecified: Secondary | ICD-10-CM | POA: Diagnosis not present

## 2019-11-08 DIAGNOSIS — R195 Other fecal abnormalities: Secondary | ICD-10-CM | POA: Diagnosis not present

## 2019-11-10 ENCOUNTER — Ambulatory Visit: Payer: PPO

## 2019-11-10 ENCOUNTER — Other Ambulatory Visit: Payer: Self-pay

## 2019-11-10 DIAGNOSIS — R42 Dizziness and giddiness: Secondary | ICD-10-CM | POA: Diagnosis not present

## 2019-11-13 ENCOUNTER — Other Ambulatory Visit: Payer: Self-pay

## 2019-11-13 ENCOUNTER — Telehealth: Payer: Self-pay

## 2019-11-13 DIAGNOSIS — I10 Essential (primary) hypertension: Secondary | ICD-10-CM

## 2019-11-13 MED ORDER — LISINOPRIL 10 MG PO TABS: 10.0000 mg | ORAL_TABLET | Freq: Two times a day (BID) | ORAL | 0 refills | Status: DC

## 2019-11-14 DIAGNOSIS — D122 Benign neoplasm of ascending colon: Secondary | ICD-10-CM | POA: Diagnosis not present

## 2019-11-14 DIAGNOSIS — D123 Benign neoplasm of transverse colon: Secondary | ICD-10-CM | POA: Diagnosis not present

## 2019-11-14 DIAGNOSIS — K635 Polyp of colon: Secondary | ICD-10-CM | POA: Diagnosis not present

## 2019-11-14 DIAGNOSIS — D120 Benign neoplasm of cecum: Secondary | ICD-10-CM | POA: Diagnosis not present

## 2019-11-14 DIAGNOSIS — K5730 Diverticulosis of large intestine without perforation or abscess without bleeding: Secondary | ICD-10-CM | POA: Diagnosis not present

## 2019-11-14 DIAGNOSIS — R195 Other fecal abnormalities: Secondary | ICD-10-CM | POA: Diagnosis not present

## 2019-11-21 NOTE — Progress Notes (Signed)
Called pt to inform him about his US. Pt understood 

## 2019-11-22 NOTE — Telephone Encounter (Signed)
Complete

## 2019-11-24 ENCOUNTER — Other Ambulatory Visit (HOSPITAL_COMMUNITY)
Admission: RE | Admit: 2019-11-24 | Discharge: 2019-11-24 | Disposition: A | Discharge status: Home / Self Care | Payer: PPO | Source: Ambulatory Visit | Attending: Cardiology | Admitting: Cardiology

## 2019-11-24 DIAGNOSIS — Z20822 Contact with and (suspected) exposure to covid-19: Secondary | ICD-10-CM | POA: Diagnosis not present

## 2019-11-24 DIAGNOSIS — Z01812 Encounter for preprocedural laboratory examination: Secondary | ICD-10-CM | POA: Insufficient documentation

## 2019-11-24 DIAGNOSIS — N183 Chronic kidney disease, stage 3 unspecified: Secondary | ICD-10-CM | POA: Diagnosis not present

## 2019-11-24 DIAGNOSIS — I129 Hypertensive chronic kidney disease with stage 1 through stage 4 chronic kidney disease, or unspecified chronic kidney disease: Secondary | ICD-10-CM | POA: Diagnosis not present

## 2019-11-24 LAB — SARS CORONAVIRUS 2 (TAT 6-24 HRS): SARS Coronavirus 2: NEGATIVE

## 2019-11-25 LAB — BASIC METABOLIC PANEL
BUN/Creatinine Ratio: 17 (ref 10–24)
BUN: 16 mg/dL (ref 8–27)
CO2: 18 mmol/L — ABNORMAL LOW (ref 20–29)
Calcium: 9.9 mg/dL (ref 8.6–10.2)
Chloride: 104 mmol/L (ref 96–106)
Creatinine, Ser: 0.96 mg/dL (ref 0.76–1.27)
GFR calc Af Amer: 84 mL/min/{1.73_m2} (ref >59)
GFR calc non Af Amer: 73 mL/min/{1.73_m2} (ref >59)
Glucose: 105 mg/dL — ABNORMAL HIGH (ref 65–99)
Potassium: 4.6 mmol/L (ref 3.5–5.2)
Sodium: 141 mmol/L (ref 134–144)

## 2019-11-28 ENCOUNTER — Ambulatory Visit (HOSPITAL_COMMUNITY)
Admission: RE | Admit: 2019-11-28 | Discharge: 2019-11-28 | Disposition: A | Discharge status: Home / Self Care | Payer: PPO | Source: Ambulatory Visit | Attending: Cardiology | Admitting: Cardiology

## 2019-11-28 ENCOUNTER — Encounter (HOSPITAL_COMMUNITY)
Admission: RE | Disposition: A | Discharge status: Home / Self Care | Payer: Self-pay | Source: Ambulatory Visit | Attending: Cardiology

## 2019-11-28 ENCOUNTER — Other Ambulatory Visit: Payer: Self-pay

## 2019-11-28 DIAGNOSIS — Z880 Allergy status to penicillin: Secondary | ICD-10-CM | POA: Insufficient documentation

## 2019-11-28 DIAGNOSIS — Z96652 Presence of left artificial knee joint: Secondary | ICD-10-CM | POA: Insufficient documentation

## 2019-11-28 DIAGNOSIS — Z79899 Other long term (current) drug therapy: Secondary | ICD-10-CM | POA: Insufficient documentation

## 2019-11-28 DIAGNOSIS — Z7982 Long term (current) use of aspirin: Secondary | ICD-10-CM | POA: Insufficient documentation

## 2019-11-28 DIAGNOSIS — I129 Hypertensive chronic kidney disease with stage 1 through stage 4 chronic kidney disease, or unspecified chronic kidney disease: Secondary | ICD-10-CM | POA: Insufficient documentation

## 2019-11-28 DIAGNOSIS — N183 Chronic kidney disease, stage 3 unspecified: Secondary | ICD-10-CM | POA: Diagnosis not present

## 2019-11-28 DIAGNOSIS — I209 Angina pectoris, unspecified: Secondary | ICD-10-CM | POA: Diagnosis present

## 2019-11-28 DIAGNOSIS — Z888 Allergy status to other drugs, medicaments and biological substances status: Secondary | ICD-10-CM | POA: Diagnosis not present

## 2019-11-28 DIAGNOSIS — M1990 Unspecified osteoarthritis, unspecified site: Secondary | ICD-10-CM | POA: Diagnosis not present

## 2019-11-28 DIAGNOSIS — R0600 Dyspnea, unspecified: Secondary | ICD-10-CM | POA: Diagnosis not present

## 2019-11-28 DIAGNOSIS — R0602 Shortness of breath: Secondary | ICD-10-CM | POA: Diagnosis not present

## 2019-11-28 DIAGNOSIS — Z881 Allergy status to other antibiotic agents status: Secondary | ICD-10-CM | POA: Insufficient documentation

## 2019-11-28 DIAGNOSIS — E782 Mixed hyperlipidemia: Secondary | ICD-10-CM | POA: Diagnosis not present

## 2019-11-28 DIAGNOSIS — R079 Chest pain, unspecified: Secondary | ICD-10-CM | POA: Diagnosis not present

## 2019-11-28 DIAGNOSIS — J449 Chronic obstructive pulmonary disease, unspecified: Secondary | ICD-10-CM | POA: Diagnosis not present

## 2019-11-28 DIAGNOSIS — R0789 Other chest pain: Secondary | ICD-10-CM | POA: Diagnosis not present

## 2019-11-28 DIAGNOSIS — R42 Dizziness and giddiness: Secondary | ICD-10-CM | POA: Diagnosis not present

## 2019-11-28 DIAGNOSIS — I10 Essential (primary) hypertension: Secondary | ICD-10-CM | POA: Diagnosis not present

## 2019-11-28 DIAGNOSIS — Z87891 Personal history of nicotine dependence: Secondary | ICD-10-CM | POA: Diagnosis not present

## 2019-11-28 DIAGNOSIS — Z79890 Hormone replacement therapy (postmenopausal): Secondary | ICD-10-CM | POA: Diagnosis not present

## 2019-11-28 HISTORY — PX: LEFT HEART CATH AND CORONARY ANGIOGRAPHY: CATH118249

## 2019-11-28 LAB — CBC
HCT: 42.3 % (ref 39.0–52.0)
Hemoglobin: 13.9 g/dL (ref 13.0–17.0)
MCH: 28.4 pg (ref 26.0–34.0)
MCHC: 32.9 g/dL (ref 30.0–36.0)
MCV: 86.5 fL (ref 80.0–100.0)
Platelets: 193 10*3/uL (ref 150–400)
RBC: 4.89 MIL/uL (ref 4.22–5.81)
RDW: 15.1 % (ref 11.5–15.5)
WBC: 9.1 10*3/uL (ref 4.0–10.5)
nRBC: 0 % (ref 0.0–0.2)

## 2019-11-28 SURGERY — LEFT HEART CATH AND CORONARY ANGIOGRAPHY
Anesthesia: LOCAL

## 2019-11-28 MED ORDER — ASPIRIN 81 MG PO CHEW: 81.0000 mg | CHEWABLE_TABLET | ORAL | Status: DC

## 2019-11-28 MED ORDER — LIDOCAINE HCL (PF) 1 % IJ SOLN
INTRAMUSCULAR | Status: AC
  Filled 2019-11-28: qty 30

## 2019-11-28 MED ORDER — MIDAZOLAM HCL 2 MG/2ML IJ SOLN
INTRAMUSCULAR | Status: AC
  Filled 2019-11-28: qty 2

## 2019-11-28 MED ORDER — HEPARIN (PORCINE) IN NACL 1000-0.9 UT/500ML-% IV SOLN
INTRAVENOUS | Status: AC
  Filled 2019-11-28: qty 1000

## 2019-11-28 MED ORDER — SODIUM CHLORIDE 0.9% FLUSH: 3.0000 mL | Freq: Two times a day (BID) | INTRAVENOUS | Status: DC

## 2019-11-28 MED ORDER — SODIUM CHLORIDE 0.9 % IV SOLN: 250.0000 mL | INTRAVENOUS | Status: DC | PRN

## 2019-11-28 MED ORDER — SODIUM CHLORIDE 0.9 % WEIGHT BASED INFUSION
244.0000 mL/h | INTRAVENOUS | Status: DC
  Administered 2019-11-28: 3 mL/kg/h via INTRAVENOUS

## 2019-11-28 MED ORDER — HEPARIN (PORCINE) IN NACL 1000-0.9 UT/500ML-% IV SOLN
INTRAVENOUS | Status: DC | PRN
  Administered 2019-11-28 (×2): 500 mL

## 2019-11-28 MED ORDER — FENTANYL CITRATE (PF) 100 MCG/2ML IJ SOLN
INTRAMUSCULAR | Status: AC
  Filled 2019-11-28: qty 2

## 2019-11-28 MED ORDER — SODIUM CHLORIDE 0.9% FLUSH: 3.0000 mL | INTRAVENOUS | Status: DC | PRN

## 2019-11-28 MED ORDER — VERAPAMIL HCL 2.5 MG/ML IV SOLN
INTRAVENOUS | Status: DC | PRN
  Administered 2019-11-28: 10 mL via INTRA_ARTERIAL

## 2019-11-28 MED ORDER — VERAPAMIL HCL 2.5 MG/ML IV SOLN
INTRAVENOUS | Status: AC
  Filled 2019-11-28: qty 2

## 2019-11-28 MED ORDER — ACETAMINOPHEN 325 MG PO TABS: 650.0000 mg | ORAL_TABLET | ORAL | Status: DC | PRN

## 2019-11-28 MED ORDER — SODIUM CHLORIDE 0.9 % IV SOLN: INTRAVENOUS | Status: AC

## 2019-11-28 MED ORDER — LABETALOL HCL 5 MG/ML IV SOLN: 10.0000 mg | INTRAVENOUS | Status: DC | PRN

## 2019-11-28 MED ORDER — HYDRALAZINE HCL 20 MG/ML IJ SOLN: 10.0000 mg | INTRAMUSCULAR | Status: DC | PRN

## 2019-11-28 MED ORDER — HEPARIN SODIUM (PORCINE) 1000 UNIT/ML IJ SOLN
INTRAMUSCULAR | Status: DC | PRN
  Administered 2019-11-28: 4500 [IU] via INTRAVENOUS

## 2019-11-28 MED ORDER — HEPARIN SODIUM (PORCINE) 1000 UNIT/ML IJ SOLN
INTRAMUSCULAR | Status: AC
  Filled 2019-11-28: qty 1

## 2019-11-28 MED ORDER — MIDAZOLAM HCL 2 MG/2ML IJ SOLN
INTRAMUSCULAR | Status: DC | PRN
  Administered 2019-11-28: 1 mg via INTRAVENOUS

## 2019-11-28 MED ORDER — LIDOCAINE HCL (PF) 1 % IJ SOLN
INTRAMUSCULAR | Status: DC | PRN
  Administered 2019-11-28: 2 mL

## 2019-11-28 MED ORDER — IOHEXOL 350 MG/ML SOLN
INTRAVENOUS | Status: DC | PRN
  Administered 2019-11-28: 55 mL

## 2019-11-28 MED ORDER — ONDANSETRON HCL 4 MG/2ML IJ SOLN: 4.0000 mg | Freq: Four times a day (QID) | INTRAMUSCULAR | Status: DC | PRN

## 2019-11-28 MED ORDER — SODIUM CHLORIDE 0.9 % WEIGHT BASED INFUSION
82.0000 mL/h | INTRAVENOUS | Status: DC
  Administered 2019-11-28: 1 mL/kg/h via INTRAVENOUS

## 2019-11-28 MED ORDER — FENTANYL CITRATE (PF) 100 MCG/2ML IJ SOLN
INTRAMUSCULAR | Status: DC | PRN
  Administered 2019-11-28: 25 ug via INTRAVENOUS

## 2019-11-28 SURGICAL SUPPLY — 12 items
CATH INFINITI 5 FR AR1 MOD (CATHETERS) ×1 IMPLANT
CATH INFINITI 5 FR JL3.5 (CATHETERS) ×1 IMPLANT
CATH INFINITI 5FR AL1 (CATHETERS) ×1 IMPLANT
CATH OPTITORQUE TIG 4.0 5F (CATHETERS) ×1 IMPLANT
DEVICE RAD COMP TR BAND LRG (VASCULAR PRODUCTS) ×1 IMPLANT
GLIDESHEATH SLEND A-KIT 6F 22G (SHEATH) ×1 IMPLANT
GUIDEWIRE INQWIRE 1.5J.035X260 (WIRE) IMPLANT
INQWIRE 1.5J .035X260CM (WIRE) ×2
KIT HEART LEFT (KITS) ×2 IMPLANT
PACK CARDIAC CATHETERIZATION (CUSTOM PROCEDURE TRAY) ×2 IMPLANT
TRANSDUCER W/STOPCOCK (MISCELLANEOUS) ×2 IMPLANT
TUBING CIL FLEX 10 FLL-RA (TUBING) ×2 IMPLANT

## 2019-11-28 NOTE — Interval H&P Note (Signed)
History and Physical Interval Note:  11/28/2019 3:22 PM  Joseph [name]  has presented today for surgery, with the diagnosis of angina.  The various methods of treatment have been discussed with the patient and family. After consideration of risks, benefits and other options for treatment, the patient has consented to  Procedure(s): LEFT HEART CATH AND CORONARY ANGIOGRAPHY (N/A) as a surgical intervention.  The patient's history has been reviewed, patient examined, no change in status, stable for surgery.  I have reviewed the patient's chart and labs.  Questions were answered to the patient's satisfaction.    2016 Appropriate Use Criteria for Coronary Revascularization in Patients With Acute Coronary Syndrome NSTEMI/UA Intermediate Risk (TIMI Score 3-4) NSTEMI/Unstable angina, stabilized patient at Intermediate Risk (TIMI Score 3-4) Link Here: http://dodson-rose.net/ Indication:  Revascularization by PCI or CABG of 1 or more arteries in a patient with NSTEMI or unstable angina with Stabilization after presentation Intermediate risk for clinical events  A (7) Indication: 16; Score 7    [place]

## 2019-11-28 NOTE — Discharge Instructions (Signed)
Drink plenty of fluid for 48 hours and keep wrist elevated at heart level for 24 hours  Radial Site Care   This sheet gives you information about how to care for yourself after your procedure. Your health care provider may also give you more specific instructions. If you have problems or questions, contact your health care provider. What can I expect after the procedure? After the procedure, it is common to have:  Bruising and tenderness at the catheter insertion area. Follow these instructions at home: Medicines  Take over-the-counter and prescription medicines only as told by your health care provider. Insertion site care 1. Follow instructions from your health care provider about how to take care of your insertion site. Make sure you: ? Wash your hands with soap and water before you change your bandage (dressing). If soap and water are not available, use hand sanitizer. ? remove your dressing as told by your health care provider. In 24 hours 2. Check your insertion site every day for signs of infection. Check for: ? Redness, swelling, or pain. ? Fluid or blood. ? Pus or a bad smell. ? Warmth. 3. Do not take baths, swim, or use a hot tub until your health care provider approves. 4. You may shower 24-48 hours after the procedure, or as directed by your health care provider. ? Remove the dressing and gently wash the site with plain soap and water. ? Pat the area dry with a clean towel. ? Do not rub the site. That could cause bleeding. 5. Do not apply powder or lotion to the site. Activity   1. For 24 hours after the procedure, or as directed by your health care provider: ? Do not flex or bend the affected arm. ? Do not push or pull heavy objects with the affected arm. ? Do not drive yourself home from the hospital or clinic. You may drive 24 hours after the procedure unless your health care provider tells you not to. ? Do not operate machinery or power tools. 2. Do not lift  anything that is heavier than 10 lb (4.5 kg), or the limit that you are told, until your health care provider says that it is safe. For 4 days 3. Ask your health care provider when it is okay to: ? Return to work or school. ? Resume usual physical activities or sports. ? Resume sexual activity. General instructions  If the catheter site starts to bleed, raise your arm and put firm pressure on the site. If the bleeding does not stop, get help right away. This is a medical emergency.  If you went home on the same day as your procedure, a responsible adult should be with you for the first 24 hours after you arrive home.  Keep all follow-up visits as told by your health care provider. This is important. Contact a health care provider if:  You have a fever.  You have redness, swelling, or yellow drainage around your insertion site. Get help right away if:  You have unusual pain at the radial site.  The catheter insertion area swells very fast.  The insertion area is bleeding, and the bleeding does not stop when you hold steady pressure on the area.  Your arm or hand becomes pale, cool, tingly, or numb. These symptoms may represent a serious problem that is an emergency. Do not wait to see if the symptoms will go away. Get medical help right away. Call your local emergency services (911 in the [place] Do not   drive yourself to the hospital. Summary  After the procedure, it is common to have bruising and tenderness at the site.  Follow instructions from your health care provider about how to take care of your radial site wound. Check the wound every day for signs of infection.  Do not lift anything that is heavier than 10 lb (4.5 kg), or the limit that you are told, until your health care provider says that it is safe. This information is not intended to replace advice given to you by your health care provider. Make sure you discuss any questions you have with your health care  provider. Document Revised: 07/28/2017 Document Reviewed: 07/28/2017 Elsevier Patient Education  2020 [employer]  

## 2019-11-28 NOTE — Progress Notes (Signed)
Patient and friend was given discharge instructions. Both verbalized understanding. 

## 2019-12-01 ENCOUNTER — Encounter: Payer: Self-pay | Admitting: Pharmacist

## 2019-12-01 DIAGNOSIS — I10 Essential (primary) hypertension: Secondary | ICD-10-CM | POA: Diagnosis not present

## 2019-12-01 NOTE — Telephone Encounter (Signed)
This encounter was created in error - please disregard.

## 2019-12-05 DIAGNOSIS — I10 Essential (primary) hypertension: Secondary | ICD-10-CM | POA: Diagnosis not present

## 2019-12-07 ENCOUNTER — Ambulatory Visit: Payer: PPO | Admitting: Cardiology

## 2019-12-07 ENCOUNTER — Other Ambulatory Visit: Payer: Self-pay

## 2019-12-07 ENCOUNTER — Encounter: Payer: Self-pay | Admitting: Cardiology

## 2019-12-07 VITALS — BP 128/68 | HR 64 | Resp 17 | Ht 72.0 in | Wt 190.0 lb

## 2019-12-07 DIAGNOSIS — E782 Mixed hyperlipidemia: Secondary | ICD-10-CM | POA: Diagnosis not present

## 2019-12-07 DIAGNOSIS — N183 Chronic kidney disease, stage 3 unspecified: Secondary | ICD-10-CM | POA: Diagnosis not present

## 2019-12-07 DIAGNOSIS — Z712 Person consulting for explanation of examination or test findings: Secondary | ICD-10-CM | POA: Diagnosis not present

## 2019-12-07 DIAGNOSIS — I10 Essential (primary) hypertension: Secondary | ICD-10-CM | POA: Diagnosis not present

## 2019-12-07 DIAGNOSIS — R0609 Other forms of dyspnea: Secondary | ICD-10-CM | POA: Diagnosis not present

## 2019-12-07 DIAGNOSIS — I129 Hypertensive chronic kidney disease with stage 1 through stage 4 chronic kidney disease, or unspecified chronic kidney disease: Secondary | ICD-10-CM | POA: Diagnosis not present

## 2019-12-07 DIAGNOSIS — Z87891 Personal history of nicotine dependence: Secondary | ICD-10-CM

## 2019-12-07 NOTE — Progress Notes (Addendum)
Joseph [name] Date of Birth: [birth year] MRN: [id number] Primary Care Provider:Pharr, Thayer Jew, MD  Date: 12/07/19 Last Office Visit: 11/07/2019  Chief Complaint  Patient presents with  . Hospitalization Follow-up    cath  . Shortness of Breath    HPI  Joseph [name] is a 85 y.o.  male who presents to the office with a chief complaint of " shortness of breath and follow-up after left heart catheterization" Patient's past medical history and cardiovascular risk factors include: Hypertension, hyperlipidemia, advanced age,former smoker.  Patient had presented to the last office visit prior to his scheduled appointment because he was having chest pain with effort related activities.  At the last visit he also stated that he has minimized his daily exercise routine to prevent symptoms of effort related chest pain.  He had undergone his extensive ischemic evaluation including an echo and a stress test in the recent past and his medications were being uptitrated.  However due to continued chest pain despite an unremarkable nonischemic evaluation patient was sent to the office to be considered for left heart catheterization.  After discussing the risks, benefits and alternatives to left heart catheterization patient decided to undergo invasive angiography to evaluate for obstructive disease.  Patient underwent left heart catheterization with Dr. Vernell Leep on Nov 28, 2019 and was noted to have normal epicardial coronary arteries.  Now presents to the office for follow-up.  Patient states that he does not have chest pain anymore.  But does have shortness of breath with effort related activities.  Patient states that he can do his activities of daily living and chores without any symptoms but when he starts exercise for a long period of time he does get effort related dyspnea towards the end of working out.  Patient has had an extensive cardiovascular evaluation and recommended that he for now  follows up with primary care for noncardiac causes of shortness of breath.  FUNCTIONAL STATUS: walks 1.5 miles on a daily basis.   ALLERGIES: Allergies  Allergen Reactions  . Ciprofloxacin Anaphylaxis and Other (See Comments)    Caused sores & blisters and a trip to the hospital- was also taking Aldara at the same time, however  . Ciprocin-Fluocin-Procin [Fluocinolone Acetonide] Other (See Comments)    Blisters and sores  . Fluocinolone Acetonide Other (See Comments)    Reaction not recalled  . Quinolones Other (See Comments)    Pt had to go to hospital (sores and blisters)  . Statins Other (See Comments)    Made the legs hurt  . Imiquimod Other (See Comments)    (Aldara) Caused sores that landed him in the hospital for 3 days- was taking Cipro at the same time, however  . Penicillins Itching, Rash and Other (See Comments)    Has patient had a PCN reaction causing immediate rash, facial/tongue/throat swelling, SOB or lightheadedness with hypotension: Yes Has patient had a PCN reaction causing severe rash involving mucus membranes or skin necrosis: No Has patient had a PCN reaction that required hospitalization: No Has patient had a PCN reaction occurring within the last 10 years: No If all of the above answers are "NO", then may proceed with Cephalosporin use.      MEDICATION LIST PRIOR TO VISIT: Current Outpatient Medications on File Prior to Visit  Medication Sig Dispense Refill  . albuterol (PROAIR HFA) 108 (90 Base) MCG/ACT inhaler Inhale 2 puffs into the lungs every 6 (six) hours as needed for wheezing or shortness of breath.     Marland Kitchen  amLODipine (NORVASC) 10 MG tablet Take 0.5 tablets (5 mg total) by mouth every evening. (Patient taking differently: Take 10 mg by mouth every evening. ) 90 tablet 1  . aspirin 81 MG chewable tablet Chew 81 mg by mouth daily.    . Dutasteride-Tamsulosin HCl (JALYN) 0.5-0.4 MG CAPS Take 1 capsule by mouth daily.     Marland Kitchen levothyroxine (SYNTHROID) 137  MCG tablet Take 137 mcg by mouth daily before breakfast. Was told on 11/06/19 by PCP not to take    . lisinopril (ZESTRIL) 10 MG tablet Take 1 tablet (10 mg total) by mouth in the morning and at bedtime. 180 tablet 0  . lovastatin (MEVACOR) 40 MG tablet Take 40 mg by mouth daily.     . magnesium oxide (MAG-OX) 400 MG tablet Take 400 mg by mouth daily.    . meloxicam (MOBIC) 7.5 MG tablet Take 7.5 mg by mouth daily as needed for pain.     . nitroGLYCERIN (NITROSTAT) 0.4 MG SL tablet Place 1 tablet (0.4 mg total) under the tongue every 5 (five) minutes as needed for chest pain. 30 tablet 3  . omeprazole (PRILOSEC) 40 MG capsule Take 40 mg by mouth daily before breakfast.    . umeclidinium-vilanterol (ANORO ELLIPTA) 62.5-25 MCG/INH AEPB Inhale 1 puff into the lungs daily.     No current facility-administered medications on file prior to visit.    PAST MEDICAL HISTORY: Past Medical History:  Diagnosis Date  . Arthritis   . COPD (chronic obstructive pulmonary disease) ([place])   . Dyspnea    on exertion  . Hyperlipidemia   . Hypertension   . Pneumothorax, spontaneous, tension 1960  . SOB (shortness of breath) on exertion     PAST SURGICAL HISTORY: Past Surgical History:  Procedure Laterality Date  . EYE SURGERY     bilateral cataract with lens implants  . LEFT HEART CATH AND CORONARY ANGIOGRAPHY N/A 11/28/2019   Procedure: LEFT HEART CATH AND CORONARY ANGIOGRAPHY;  Surgeon: Nigel Mormon, MD;  Location: [facility];  Service: Cardiovascular;  Laterality: N/A;  . LUNG SURGERY     40 years ago  . TOE SURGERY    . TOTAL KNEE ARTHROPLASTY Left 10/06/2017   Procedure: LEFT TOTAL KNEE ARTHROPLASTY;  Surgeon: Susa Day, MD;  Location: [facility];  Service: Orthopedics;  Laterality: Left;  Adductor Block    FAMILY HISTORY: The patient's family history is not on file.   SOCIAL HISTORY:  The patient  reports that he quit smoking about 61 years ago. His smoking use included  cigarettes. He has a 5.00 pack-year smoking history. He has never used smokeless tobacco. He reports that he does not drink alcohol or use drugs.  Review of Systems  Constitution: Positive for malaise/fatigue. Negative for chills and fever.  HENT: Negative for ear discharge, ear pain and nosebleeds.   Eyes: Negative for blurred vision and discharge.  Cardiovascular: Positive for dyspnea on exertion. Negative for chest pain, claudication, leg swelling, near-syncope, orthopnea, palpitations, paroxysmal nocturnal dyspnea and syncope.  Respiratory: Positive for snoring. Negative for cough and shortness of breath.   Endocrine: Negative for polydipsia, polyphagia and polyuria.  Hematologic/Lymphatic: Negative for bleeding problem.  Skin: Negative for flushing and nail changes.  Musculoskeletal: Negative for muscle cramps, muscle weakness and myalgias.  Gastrointestinal: Negative for abdominal pain, dysphagia, hematemesis, hematochezia, melena, nausea and vomiting.  Neurological: Negative for dizziness, focal weakness and light-headedness.    PHYSICAL EXAM: Vitals with BMI 12/07/2019 11/28/2019 11/28/2019  Height '6\' 0"'  - -  Weight 190 lbs - -  BMI 79.89 - -  Systolic 211 941 740  Diastolic 68 68 70  Pulse 64 72 73   Orthostatic VS for the past 72 hrs (Last 3 readings):  Patient Position BP Location Cuff Size  12/07/19 1412 Sitting Left Arm Normal    CONSTITUTIONAL: Well-developed and well-nourished. No acute distress.  SKIN: Skin is warm and dry. No rash noted. No cyanosis. No pallor. No jaundice HEAD: Normocephalic and atraumatic.  EYES: No scleral icterus MOUTH/THROAT: Moist oral membranes.  NECK: No JVD present. No thyromegaly noted. No carotid bruits  LYMPHATIC: No visible cervical adenopathy.  CHEST Normal respiratory effort. No intercostal retractions  LUNGS: Clear to auscultation bilaterally.  No stridor. No wheezes. No rales.  CARDIOVASCULAR: Regular rate and rhythm, positive  S1-S2, no murmurs rubs or gallops appreciated. ABDOMINAL: Soft, nontender, nondistended, positive bowel sounds in all 4 quadrants.  No apparent ascites.  EXTREMITIES: No peripheral edema, compression stockings present. HEMATOLOGIC: No significant bruising NEUROLOGIC: Oriented to person, place, and time. Nonfocal. Normal muscle tone.  PSYCHIATRIC: Normal mood and affect. Normal behavior. Cooperative  EKG: 09/08/2019 0937: Normal sinus rhythm with a ventricular rate of 82 bpm, normal axis deviation, no underlying ischemia or injury pattern. 45/10/2019: Normal sinus rhythm, 72 bpm, normal axis, without underlying ischemia or injury pattern.  Echocardiogram: 09/2019: LVEF 55-60%, mild LVH, normal diastolic filling pressures, normal left atrial pressures, mildly dilated left atrium, mild MR, mild TR. Large liver cyst (6.9x7.8cm) noted. Consider dedicated imaging, if clinically indicated.  Stress Testing:  Lexiscan 09/11/2019: Nondiagnostic ECG stress. The baseline blood pressure was 80/54 mmHg and decreased to 78/54 mmHg at peak infusion, which is a physiologic response to Intravenous Lexiscan. Patient asymptomatic. Resting EKG demonstrated normal sinus rhythm. Non-specific ST-T abnormality. Peak EKG revealed no significant ST-T change from baseline abnormality. Myocardial perfusion is normal. Stress LV EF: 52%. No previous exam available for comparison. Low risk study.   Heart Catheterization: 11/28/2019 by Dr. Joya Gaskins Patwardhan: Normal epicardial coronary arteries.  Normal LVEDP.  Carotid Duplex: 81/44/8185: Peak systolic velocities in the right bifurcation, internal, external and common carotid arteries are within normal limits. Minimal stenosis in the left internal carotid artery (minimal) with homogenous plaque.  Right vertebral artery flow is not visualized. Antegrade left vertebral artery flow.  14 - day Mobile Cardiac Ambulatory Telemetry. Enrollment Period:  10/18/2019-10/31/2019 Predominant rhythm normal sinus with heart rate ranging from 45 - 121 bpm, average heart rate 69 bpm. No pauses greater than or equal to 2.5 seconds. No atrial fibrillation detected during the monitoring period.   Total supraventricular ectopic burden <1% (323 supraventricular ectopy, 16 supraventricular runs). Longest run was 22 beats in duration at 162 bpm. The fastest run was 3 beats in duration at 171 bpm. Total ventricular ectopic burden <1% (6925 ventricular ectopy, 0 ventricular pairs and 0 ventricular runs). Heart rate < 60 bpm for 27.8% of the recording. Heart rate > 100 bpm for 3.4% of the recording. No Auto triggered events.   21 Patient triggered events reviewed, not associated with any significant arrhythmia (baseline artifact in many tracings).  Recent labs: 09/08/2019: Glucose 110, BUN/Cr 130.93 EGFR >60. Na/K 140/4.1.  H/H 15.6/48.8.   LABORATORY DATA: CBC Latest Ref Rng & Units 11/28/2019 09/08/2019 06/09/2018  WBC 4.0 - 10.5 K/uL 9.1 7.0 6.1  Hemoglobin 13.0 - 17.0 g/dL 13.9 15.6 14.6  Hematocrit 39.0 - 52.0 % 42.3 48.8 44.4  Platelets 150 - 400 K/uL 193 178  149    CMP Latest Ref Rng & Units 11/24/2019 10/05/2019 09/25/2019  Glucose 65 - 99 mg/dL 105(H) 115(H) 121(H)  BUN 8 - 27 mg/dL '16 18 17  ' Creatinine 0.76 - 1.27 mg/dL 0.96 1.06 1.16  Sodium 134 - 144 mmol/L 141 140 141  Potassium 3.5 - 5.2 mmol/L 4.6 4.4 3.6  Chloride 96 - 106 mmol/L 104 100 100  CO2 20 - 29 mmol/L 18(L) 22 24  Calcium 8.6 - 10.2 mg/dL 9.9 9.9 9.7  Total Protein 6.1 - 8.1 g/dL - - -  Total Bilirubin 0.2 - 1.2 mg/dL - - -  Alkaline Phos 39 - 117 U/L - - -  AST 10 - 35 U/L - - -  ALT 9 - 46 U/L - - -    Lipid Panel     Component Value Date/Time   CHOL 139 09/25/2019 0813   TRIG 122 09/25/2019 0813   HDL 45 09/25/2019 0813   LDLCALC 72 09/25/2019 0813   LABVLDL 22 09/25/2019 0813   FINAL MEDICATION LIST END OF ENCOUNTER: No orders of the defined types were placed in  this encounter.   There are no discontinued medications.   Current Outpatient Medications:  .  albuterol (PROAIR HFA) 108 (90 Base) MCG/ACT inhaler, Inhale 2 puffs into the lungs every 6 (six) hours as needed for wheezing or shortness of breath. , Disp: , Rfl:  .  amLODipine (NORVASC) 10 MG tablet, Take 0.5 tablets (5 mg total) by mouth every evening. (Patient taking differently: Take 10 mg by mouth every evening. ), Disp: 90 tablet, Rfl: 1 .  aspirin 81 MG chewable tablet, Chew 81 mg by mouth daily., Disp: , Rfl:  .  Dutasteride-Tamsulosin HCl (JALYN) 0.5-0.4 MG CAPS, Take 1 capsule by mouth daily. , Disp: , Rfl:  .  levothyroxine (SYNTHROID) 137 MCG tablet, Take 137 mcg by mouth daily before breakfast. Was told on 11/06/19 by PCP not to take, Disp: , Rfl:  .  lisinopril (ZESTRIL) 10 MG tablet, Take 1 tablet (10 mg total) by mouth in the morning and at bedtime., Disp: 180 tablet, Rfl: 0 .  lovastatin (MEVACOR) 40 MG tablet, Take 40 mg by mouth daily. , Disp: , Rfl:  .  magnesium oxide (MAG-OX) 400 MG tablet, Take 400 mg by mouth daily., Disp: , Rfl:  .  meloxicam (MOBIC) 7.5 MG tablet, Take 7.5 mg by mouth daily as needed for pain. , Disp: , Rfl:  .  nitroGLYCERIN (NITROSTAT) 0.4 MG SL tablet, Place 1 tablet (0.4 mg total) under the tongue every 5 (five) minutes as needed for chest pain., Disp: 30 tablet, Rfl: 3 .  omeprazole (PRILOSEC) 40 MG capsule, Take 40 mg by mouth daily before breakfast., Disp: , Rfl:  .  umeclidinium-vilanterol (ANORO ELLIPTA) 62.5-25 MCG/INH AEPB, Inhale 1 puff into the lungs daily., Disp: , Rfl:   IMPRESSION:    ICD-10-CM   1. Encounter to discuss test results  Z71.2   2. Dyspnea on exertion  R06.00 EXTERNAL ECG MONITOR (UP TO 30 DAYS) ROCT  3. Benign hypertension with CKD (chronic kidney disease) stage III  I12.9    N18.30   4. Mixed hyperlipidemia  E78.2   5. Essential hypertension, benign  I10   6. Former smoker  Z87.891      RECOMMENDATIONS: Joseph [name] is a 85 y.o. male whose past medical history and cardiovascular risk factors include: Hypertension, hyperlipidemia, advanced age,former smoker.  Dyspnea on exertion:  From a cardiovascular standpoint  patient has undergone an echo, stress test, and invasive angiography to evaluate for obstructive coronary artery disease results are stated above for further reference.  Despite a detailed cardiovascular evaluation he continues to have effort related dyspnea.  I have asked him to discuss this further with his primary care provider and possibly consider noncardiac causes of effort related dyspnea and even consider pulmonary evaluation.  And exercise-induced asthma can be considered as one of the differential diagnosis.  Patient states that he has an upcoming appointment with his PCP and we will discuss further.  Patient had a 14-day mobile cardiac ambulatory telemetry and the results were discussed with the patient in great detail.  Patient did have episodes of supraventricular tachycardia.  We discussed possibly starting beta-blocker therapy. However, in the past he has had symptoms of dizziness with changing positions  as well as orthostatic hypotension.  He would like to hold off on additional medications at this time and we will readdress this at the next visit.   Patient was wondering if he had underlying atrial fibrillation.  I informed him that this was not detected during his 14-day monitor. We discussed possibly extending it to 30-day mobile cardiac ambulatory telemetry with Zio patch once available and if pulmonary evaluation is negative. He verbalized understanding.   Benign essential hypertension . Office blood pressure is at goal.  . Medication reconciled.  . Low salt diet recommended. A diet that is rich in fruits, vegetables, legumes, and low-fat dairy products and low in snacks, sweets, and meats (such as the Dietary Approaches to Stop Hypertension [DASH] diet).   Former smoker:  Educated on the importance of continued smoking cessation.   Orders Placed This Encounter  Procedures  . EXTERNAL ECG MONITOR (UP TO 30 DAYS) ROCT   --Continue cardiac medications as reconciled in final medication list. --Return in about 3 months (around 03/08/2020) for re-evaluation of symptoms.. Or sooner if needed. --Continue follow-up with your primary care physician regarding the management of your other chronic comorbid conditions.  Patient's questions and concerns were addressed to his satisfaction. He voices understanding of the instructions provided during this encounter.   This note was created using a voice recognition software as a result there may be grammatical errors inadvertently enclosed that do not reflect the nature of this encounter. Every attempt is made to correct such errors.  Rex Kras, [place], [facility]  Pager: [phone] Office: [phone]

## 2019-12-12 DIAGNOSIS — J449 Chronic obstructive pulmonary disease, unspecified: Secondary | ICD-10-CM | POA: Diagnosis not present

## 2019-12-12 DIAGNOSIS — I10 Essential (primary) hypertension: Secondary | ICD-10-CM | POA: Diagnosis not present

## 2019-12-12 DIAGNOSIS — E7800 Pure hypercholesterolemia, unspecified: Secondary | ICD-10-CM | POA: Diagnosis not present

## 2019-12-12 DIAGNOSIS — I2510 Atherosclerotic heart disease of native coronary artery without angina pectoris: Secondary | ICD-10-CM | POA: Diagnosis not present

## 2019-12-15 DIAGNOSIS — R972 Elevated prostate specific antigen [PSA]: Secondary | ICD-10-CM | POA: Diagnosis not present

## 2019-12-15 DIAGNOSIS — N411 Chronic prostatitis: Secondary | ICD-10-CM | POA: Diagnosis not present

## 2019-12-15 DIAGNOSIS — N401 Enlarged prostate with lower urinary tract symptoms: Secondary | ICD-10-CM | POA: Diagnosis not present

## 2019-12-15 DIAGNOSIS — G8929 Other chronic pain: Secondary | ICD-10-CM | POA: Diagnosis not present

## 2019-12-15 DIAGNOSIS — N529 Male erectile dysfunction, unspecified: Secondary | ICD-10-CM | POA: Diagnosis not present

## 2019-12-15 DIAGNOSIS — R102 Pelvic and perineal pain: Secondary | ICD-10-CM | POA: Diagnosis not present

## 2020-01-03 DIAGNOSIS — I10 Essential (primary) hypertension: Secondary | ICD-10-CM | POA: Diagnosis not present

## 2020-01-05 ENCOUNTER — Ambulatory Visit: Payer: PPO

## 2020-01-05 ENCOUNTER — Other Ambulatory Visit: Payer: Self-pay

## 2020-01-10 DIAGNOSIS — J449 Chronic obstructive pulmonary disease, unspecified: Secondary | ICD-10-CM | POA: Diagnosis not present

## 2020-01-10 DIAGNOSIS — N390 Urinary tract infection, site not specified: Secondary | ICD-10-CM | POA: Diagnosis not present

## 2020-01-10 DIAGNOSIS — R748 Abnormal levels of other serum enzymes: Secondary | ICD-10-CM | POA: Diagnosis not present

## 2020-01-10 DIAGNOSIS — E039 Hypothyroidism, unspecified: Secondary | ICD-10-CM | POA: Diagnosis not present

## 2020-01-10 DIAGNOSIS — I10 Essential (primary) hypertension: Secondary | ICD-10-CM | POA: Diagnosis not present

## 2020-01-11 ENCOUNTER — Ambulatory Visit (INDEPENDENT_AMBULATORY_CARE_PROVIDER_SITE_OTHER): Payer: PPO | Admitting: Ophthalmology

## 2020-01-11 ENCOUNTER — Encounter (INDEPENDENT_AMBULATORY_CARE_PROVIDER_SITE_OTHER): Payer: Self-pay | Admitting: Ophthalmology

## 2020-01-11 ENCOUNTER — Other Ambulatory Visit: Payer: Self-pay

## 2020-01-11 DIAGNOSIS — H33321 Round hole, right eye: Secondary | ICD-10-CM | POA: Diagnosis not present

## 2020-01-11 DIAGNOSIS — H353131 Nonexudative age-related macular degeneration, bilateral, early dry stage: Secondary | ICD-10-CM | POA: Insufficient documentation

## 2020-01-11 DIAGNOSIS — H35371 Puckering of macula, right eye: Secondary | ICD-10-CM | POA: Insufficient documentation

## 2020-01-11 DIAGNOSIS — H3562 Retinal hemorrhage, left eye: Secondary | ICD-10-CM | POA: Insufficient documentation

## 2020-01-11 DIAGNOSIS — H353132 Nonexudative age-related macular degeneration, bilateral, intermediate dry stage: Secondary | ICD-10-CM

## 2020-01-11 DIAGNOSIS — H43812 Vitreous degeneration, left eye: Secondary | ICD-10-CM | POA: Diagnosis not present

## 2020-01-11 DIAGNOSIS — H35052 Retinal neovascularization, unspecified, left eye: Secondary | ICD-10-CM | POA: Diagnosis not present

## 2020-01-11 DIAGNOSIS — Z961 Presence of intraocular lens: Secondary | ICD-10-CM

## 2020-01-11 HISTORY — DX: Puckering of macula, right eye: H35.371

## 2020-01-11 NOTE — Progress Notes (Signed)
01/11/2020     CHIEF COMPLAINT Patient presents for Blurred Vision   HISTORY OF PRESENT ILLNESS: Joseph [name] is a 84 y.o. male who presents to the clinic today for:   HPI    Blurred Vision    In both eyes.  Onset was sudden.  Vision is blurred.  Severity is moderate.  This started 6 weeks ago.  Occurring intermittently.  It is worse when reading and throughout the day.  Context:  watching TV and reading.  Since onset it is stable.  Treatments tried include no treatments.  Response to treatment was no improvement.  I, the attending physician,  performed the HPI with the patient and updated documentation appropriately.          Comments    Pt c/o Blurry vision. Pt states he will be reading or watching TV and after a while vision becomes very blurry. Pt states onset was about 6 weeks ago. Pt denies FOL or floaters.       Last edited by Tilda Franco on 01/11/2020  9:25 AM. (History)      Referring physician: Deland Pretty, MD [place],   [id number]  HISTORICAL INFORMATION:   Selected notes from the MEDICAL RECORD NUMBER       CURRENT MEDICATIONS: No current outpatient medications on file. (Ophthalmic Drugs)   No current facility-administered medications for this visit. (Ophthalmic Drugs)   Current Outpatient Medications (Other)  Medication Sig  . albuterol (PROAIR HFA) 108 (90 Base) MCG/ACT inhaler Inhale 2 puffs into the lungs every 6 (six) hours as needed for wheezing or shortness of breath.   Marland Kitchen amLODipine (NORVASC) 10 MG tablet Take 0.5 tablets (5 mg total) by mouth every evening. (Patient taking differently: Take 10 mg by mouth every evening. )  . aspirin 81 MG chewable tablet Chew 81 mg by mouth daily.  . Dutasteride-Tamsulosin HCl (JALYN) 0.5-0.4 MG CAPS Take 1 capsule by mouth daily.   Marland Kitchen levothyroxine (SYNTHROID) 137 MCG tablet Take 137 mcg by mouth daily before breakfast. Was told on 11/06/19 by PCP not to take  . lisinopril  (ZESTRIL) 10 MG tablet Take 1 tablet (10 mg total) by mouth in the morning and at bedtime.  . lovastatin (MEVACOR) 40 MG tablet Take 40 mg by mouth daily.   . magnesium oxide (MAG-OX) 400 MG tablet Take 400 mg by mouth daily.  . meloxicam (MOBIC) 7.5 MG tablet Take 7.5 mg by mouth daily as needed for pain.   . nitroGLYCERIN (NITROSTAT) 0.4 MG SL tablet Place 1 tablet (0.4 mg total) under the tongue every 5 (five) minutes as needed for chest pain.  Marland Kitchen omeprazole (PRILOSEC) 40 MG capsule Take 40 mg by mouth daily before breakfast.  . umeclidinium-vilanterol (ANORO ELLIPTA) 62.5-25 MCG/INH AEPB Inhale 1 puff into the lungs daily.   No current facility-administered medications for this visit. (Other)      REVIEW OF SYSTEMS:    ALLERGIES Allergies  Allergen Reactions  . Ciprofloxacin Anaphylaxis and Other (See Comments)    Caused sores & blisters and a trip to the hospital- was also taking Aldara at the same time, however  . Ciprocin-Fluocin-Procin [Fluocinolone Acetonide] Other (See Comments)    Blisters and sores  . Fluocinolone Acetonide Other (See Comments)    Reaction not recalled  . Quinolones Other (See Comments)    Pt had to go to hospital (sores and blisters)  . Statins Other (See Comments)    Made the legs  hurt  . Imiquimod Other (See Comments)    (Aldara) Caused sores that landed him in the hospital for 3 days- was taking Cipro at the same time, however  . Penicillins Itching, Rash and Other (See Comments)    Has patient had a PCN reaction causing immediate rash, facial/tongue/throat swelling, SOB or lightheadedness with hypotension: Yes Has patient had a PCN reaction causing severe rash involving mucus membranes or skin necrosis: No Has patient had a PCN reaction that required hospitalization: No Has patient had a PCN reaction occurring within the last 10 years: No If all of the above answers are "NO", then may proceed with Cephalosporin use.     PAST MEDICAL  HISTORY Past Medical History:  Diagnosis Date  . Arthritis   . COPD (chronic obstructive pulmonary disease) ([place])   . Dyspnea    on exertion  . Hyperlipidemia   . Hypertension   . Pneumothorax, spontaneous, tension 1960  . SOB (shortness of breath) on exertion    Past Surgical History:  Procedure Laterality Date  . EYE SURGERY     bilateral cataract with lens implants  . LEFT HEART CATH AND CORONARY ANGIOGRAPHY N/A 11/28/2019   Procedure: LEFT HEART CATH AND CORONARY ANGIOGRAPHY;  Surgeon: Nigel Mormon, MD;  Location: [facility];  Service: Cardiovascular;  Laterality: N/A;  . LUNG SURGERY     40 years ago  . TOE SURGERY    . TOTAL KNEE ARTHROPLASTY Left 10/06/2017   Procedure: LEFT TOTAL KNEE ARTHROPLASTY;  Surgeon: Susa Day, MD;  Location: [facility];  Service: Orthopedics;  Laterality: Left;  Adductor Block    FAMILY HISTORY History reviewed. No pertinent family history.  SOCIAL HISTORY Social History   Tobacco Use  . Smoking status: Former Smoker    Packs/day: 1.00    Years: 5.00    Pack years: 5.00    Types: Cigarettes    Quit date: 11/24/1958    Years since quitting: 61.1  . Smokeless tobacco: Never Used  Vaping Use  . Vaping Use: Never used  Substance Use Topics  . Alcohol use: No  . Drug use: No         OPHTHALMIC EXAM:  Base Eye Exam    Visual Acuity (Snellen - Linear)      Right Left   Dist [place] 20/50 -2 20/50   Dist ph [place] NI 20/25       Tonometry (Tonopen, 9:31 AM)      Right Left   Pressure 12 14       Pupils      Pupils Dark Light Shape React APD   Right PERRL 3 3 Round Minimal None   Left PERRL 3 3 Round Minimal None       Visual Fields (Counting fingers)      Left Right   Restrictions Partial outer superior temporal, superior nasal deficiencies Partial outer superior temporal, superior nasal deficiencies       Neuro/Psych    Oriented x3: Yes   Mood/Affect: Normal       Dilation    Both eyes: 1.0% Mydriacyl,  2.5% Phenylephrine @ 9:31 AM        Slit Lamp and Fundus Exam    External Exam      Right Left   External Normal Normal       Slit Lamp Exam      Right Left   Lids/Lashes Normal Normal   Conjunctiva/Sclera White and quiet White and quiet  Cornea Clear Clear   Anterior Chamber Deep and quiet Deep and quiet   Iris Round and reactive Round and reactive   Lens Centered posterior chamber intraocular lens Centered posterior chamber intraocular lens   Anterior Vitreous Normal Normal       Fundus Exam      Right Left   Posterior Vitreous Posterior vitreous detachment Posterior vitreous detachment   Disc Normal Normal   C/D Ratio 0.5 0.55   Macula Epiretinal membrane, severe topo distortion Normal   Vessels Normal Normal   Periphery Normal, retinopexy present superotemporal, no new retinal tears Normal, no retinal tears          IMAGING AND PROCEDURES  Imaging and Procedures for 01/11/20  OCT, Retina - OU - Both Eyes       Right Eye Central Foveal Thickness: 698. Progression has worsened. Findings include epiretinal membrane.   Left Eye Quality was good. Scan locations included subfoveal. Central Foveal Thickness: 255. Findings include retinal drusen , abnormal foveal contour.   Notes OD, with very severe epiretinal membrane, macular thickening, distortion of the internal foveal and macular architecture.  This is developed rapidly over the last 10 months.  OS with intermediate ARMD                ASSESSMENT/PLAN:  No problem-specific Assessment & Plan notes found for this encounter.      ICD-10-CM   1. Retinal neovascularization of left eye  H35.052 OCT, Retina - OU - Both Eyes  2. Posterior vitreous detachment of left eye  H43.812   3. Retinal hemorrhage of left eye  H35.62   4. Pseudophakia of both eyes  Z96.1   5. Round hole of right eye  H33.321   6. Macular pucker, right eye  H35.371   7. Intermediate stage nonexudative age-related macular  degeneration of both eyes  H35.3132     1.  OD, will need fundus fluorescein angiography to confirm macular perfusion intact prior to offering surgical intervention to repair rapid onset of epiretinal membrane and topographic distortion vision loss OD  2.  3.  Ophthalmic Meds Ordered this visit:  No orders of the defined types were placed in this encounter.      Return in about 1 week (around 01/18/2020) for OPTOS FFA R/L, DILATE OU, COLOR FP.  There are no Patient Instructions on file for this visit.   Explained the diagnoses, plan, and follow up with the patient and they expressed understanding.  Patient expressed understanding of the importance of proper follow up care.   Clent Demark Trent Gabler M.D. Diseases & Surgery of the Retina and Vitreous Retina & Diabetic [place] 01/11/20     Abbreviations: M myopia (nearsighted); A astigmatism; H hyperopia (farsighted); P presbyopia; Mrx spectacle prescription;  CTL contact lenses; OD right eye; OS left eye; OU both eyes  XT exotropia; ET esotropia; PEK punctate epithelial keratitis; PEE punctate epithelial erosions; DES dry eye syndrome; MGD meibomian gland dysfunction; ATs artificial tears; PFAT's preservative free artificial tears; [place] nuclear sclerotic cataract; PSC posterior subcapsular cataract; ERM epi-retinal membrane; PVD posterior vitreous detachment; RD retinal detachment; DM diabetes mellitus; DR diabetic retinopathy; NPDR non-proliferative diabetic retinopathy; PDR proliferative diabetic retinopathy; CSME clinically significant macular edema; DME diabetic macular edema; dbh dot blot hemorrhages; CWS cotton wool spot; POAG primary open angle glaucoma; C/D cup-to-disc ratio; HVF humphrey visual field; GVF goldmann visual field; OCT optical coherence tomography; IOP intraocular pressure; BRVO Branch retinal vein occlusion; CRVO central retinal vein occlusion; CRAO  central retinal artery occlusion; BRAO branch retinal artery occlusion; RT  retinal tear; SB scleral buckle; PPV pars plana vitrectomy; VH Vitreous hemorrhage; PRP panretinal laser photocoagulation; IVK intravitreal kenalog; VMT vitreomacular traction; MH Macular hole;  NVD neovascularization of the disc; NVE neovascularization elsewhere; AREDS age related eye disease study; ARMD age related macular degeneration; POAG primary open angle glaucoma; EBMD epithelial/anterior basement membrane dystrophy; ACIOL anterior chamber intraocular lens; IOL intraocular lens; PCIOL posterior chamber intraocular lens; Phaco/IOL phacoemulsification with intraocular lens placement; [place] photorefractive keratectomy; LASIK laser assisted in situ keratomileusis; HTN hypertension; DM diabetes mellitus; COPD chronic obstructive pulmonary disease

## 2020-01-16 ENCOUNTER — Ambulatory Visit: Payer: PPO | Admitting: Cardiology

## 2020-01-17 DIAGNOSIS — R911 Solitary pulmonary nodule: Secondary | ICD-10-CM | POA: Diagnosis not present

## 2020-01-17 DIAGNOSIS — J449 Chronic obstructive pulmonary disease, unspecified: Secondary | ICD-10-CM | POA: Diagnosis not present

## 2020-01-17 DIAGNOSIS — M79604 Pain in right leg: Secondary | ICD-10-CM | POA: Diagnosis not present

## 2020-01-17 DIAGNOSIS — I2510 Atherosclerotic heart disease of native coronary artery without angina pectoris: Secondary | ICD-10-CM | POA: Diagnosis not present

## 2020-01-17 DIAGNOSIS — E039 Hypothyroidism, unspecified: Secondary | ICD-10-CM | POA: Diagnosis not present

## 2020-01-17 DIAGNOSIS — N401 Enlarged prostate with lower urinary tract symptoms: Secondary | ICD-10-CM | POA: Diagnosis not present

## 2020-01-17 DIAGNOSIS — Z0000 Encounter for general adult medical examination without abnormal findings: Secondary | ICD-10-CM | POA: Diagnosis not present

## 2020-01-17 DIAGNOSIS — E7800 Pure hypercholesterolemia, unspecified: Secondary | ICD-10-CM | POA: Diagnosis not present

## 2020-01-17 DIAGNOSIS — I6529 Occlusion and stenosis of unspecified carotid artery: Secondary | ICD-10-CM | POA: Diagnosis not present

## 2020-01-17 DIAGNOSIS — I10 Essential (primary) hypertension: Secondary | ICD-10-CM | POA: Diagnosis not present

## 2020-01-17 DIAGNOSIS — J441 Chronic obstructive pulmonary disease with (acute) exacerbation: Secondary | ICD-10-CM | POA: Diagnosis not present

## 2020-01-17 DIAGNOSIS — R748 Abnormal levels of other serum enzymes: Secondary | ICD-10-CM | POA: Diagnosis not present

## 2020-01-25 ENCOUNTER — Ambulatory Visit (INDEPENDENT_AMBULATORY_CARE_PROVIDER_SITE_OTHER): Payer: PPO | Admitting: Ophthalmology

## 2020-01-25 ENCOUNTER — Other Ambulatory Visit: Payer: Self-pay

## 2020-01-25 ENCOUNTER — Encounter (INDEPENDENT_AMBULATORY_CARE_PROVIDER_SITE_OTHER): Payer: Self-pay | Admitting: Ophthalmology

## 2020-01-25 DIAGNOSIS — H35371 Puckering of macula, right eye: Secondary | ICD-10-CM | POA: Diagnosis not present

## 2020-01-25 MED ORDER — TOBRAMYCIN-DEXAMETHASONE 0.3-0.1 % OP SUSP: 1.0000 [drp] | Freq: Four times a day (QID) | OPHTHALMIC | 0 refills | Status: AC

## 2020-01-25 NOTE — Assessment & Plan Note (Signed)
Surgical intervention not reviewed.  Patient wished to proceed with surgical intervention with vitrectomy, membrane peel, [id number] OD

## 2020-01-25 NOTE — Progress Notes (Signed)
01/25/2020     CHIEF COMPLAINT Patient presents for Retina Follow Up   HISTORY OF PRESENT ILLNESS: Joseph [name] is a 85 y.o. male who presents to the clinic today for:   HPI    Retina Follow Up    Patient presents with  Other.  In both eyes.  Duration of 2 weeks.  Since onset it is stable.          Comments    2 week follow up - FFA R/L Patient denies change in vision and overall has no complaints.        Last edited by Gerda Diss on 01/25/2020 12:58 PM. (History)      Referring physician: Deland Pretty, MD [place],   [id number]  HISTORICAL INFORMATION:   Selected notes from the [facility]: No current outpatient medications on file. (Ophthalmic Drugs)   No current facility-administered medications for this visit. (Ophthalmic Drugs)   Current Outpatient Medications (Other)  Medication Sig  . albuterol (PROAIR HFA) 108 (90 Base) MCG/ACT inhaler Inhale 2 puffs into the lungs every 6 (six) hours as needed for wheezing or shortness of breath.   Marland Kitchen amLODipine (NORVASC) 10 MG tablet Take 0.5 tablets (5 mg total) by mouth every evening. (Patient taking differently: Take 10 mg by mouth every evening. )  . aspirin 81 MG chewable tablet Chew 81 mg by mouth daily.  . Dutasteride-Tamsulosin HCl (JALYN) 0.5-0.4 MG CAPS Take 1 capsule by mouth daily.   Marland Kitchen levothyroxine (SYNTHROID) 137 MCG tablet Take 137 mcg by mouth daily before breakfast. Was told on 11/06/19 by PCP not to take  . lisinopril (ZESTRIL) 10 MG tablet Take 1 tablet (10 mg total) by mouth in the morning and at bedtime.  . lovastatin (MEVACOR) 40 MG tablet Take 40 mg by mouth daily.   . magnesium oxide (MAG-OX) 400 MG tablet Take 400 mg by mouth daily.  . meloxicam (MOBIC) 7.5 MG tablet Take 7.5 mg by mouth daily as needed for pain.   . nitroGLYCERIN (NITROSTAT) 0.4 MG SL tablet Place 1 tablet (0.4 mg total) under the tongue every 5 (five)  minutes as needed for chest pain.  Marland Kitchen omeprazole (PRILOSEC) 40 MG capsule Take 40 mg by mouth daily before breakfast.  . umeclidinium-vilanterol (ANORO ELLIPTA) 62.5-25 MCG/INH AEPB Inhale 1 puff into the lungs daily.   No current facility-administered medications for this visit. (Other)      REVIEW OF SYSTEMS:    ALLERGIES Allergies  Allergen Reactions  . Ciprofloxacin Anaphylaxis and Other (See Comments)    Caused sores & blisters and a trip to the hospital- was also taking Aldara at the same time, however  . Ciprocin-Fluocin-Procin [Fluocinolone Acetonide] Other (See Comments)    Blisters and sores  . Fluocinolone Acetonide Other (See Comments)    Reaction not recalled  . Quinolones Other (See Comments)    Pt had to go to hospital (sores and blisters)  . Statins Other (See Comments)    Made the legs hurt  . Imiquimod Other (See Comments)    (Aldara) Caused sores that landed him in the hospital for 3 days- was taking Cipro at the same time, however  . Penicillins Itching, Rash and Other (See Comments)    Has patient had a PCN reaction causing immediate rash, facial/tongue/throat swelling, SOB or lightheadedness with hypotension: Yes Has patient had a PCN reaction causing severe rash involving mucus membranes or  skin necrosis: No Has patient had a PCN reaction that required hospitalization: No Has patient had a PCN reaction occurring within the last 10 years: No If all of the above answers are "NO", then may proceed with Cephalosporin use.     PAST MEDICAL HISTORY Past Medical History:  Diagnosis Date  . Arthritis   . COPD (chronic obstructive pulmonary disease) ([place])   . Dyspnea    on exertion  . Hyperlipidemia   . Hypertension   . Pneumothorax, spontaneous, tension 1960  . SOB (shortness of breath) on exertion    Past Surgical History:  Procedure Laterality Date  . EYE SURGERY     bilateral cataract with lens implants  . LEFT HEART CATH AND CORONARY ANGIOGRAPHY  N/A 11/28/2019   Procedure: LEFT HEART CATH AND CORONARY ANGIOGRAPHY;  Surgeon: Nigel Mormon, MD;  Location: [facility];  Service: Cardiovascular;  Laterality: N/A;  . LUNG SURGERY     40 years ago  . TOE SURGERY    . TOTAL KNEE ARTHROPLASTY Left 10/06/2017   Procedure: LEFT TOTAL KNEE ARTHROPLASTY;  Surgeon: Susa Day, MD;  Location: [facility];  Service: Orthopedics;  Laterality: Left;  Adductor Block    FAMILY HISTORY History reviewed. No pertinent family history.  SOCIAL HISTORY Social History   Tobacco Use  . Smoking status: Former Smoker    Packs/day: 1.00    Years: 5.00    Pack years: 5.00    Types: Cigarettes    Quit date: 11/24/1958    Years since quitting: 61.2  . Smokeless tobacco: Never Used  Vaping Use  . Vaping Use: Never used  Substance Use Topics  . Alcohol use: No  . Drug use: No         OPHTHALMIC EXAM: Base Eye Exam    Visual Acuity (Snellen - Linear)      Right Left   Dist [place] 20/60+2 20/25-1   Dist ph [place] NI        Tonometry (Tonopen, 1:06 PM)      Right Left   Pressure 6 9       Pupils      Pupils Dark Light Shape React APD   Right PERRL 3 3 Round Minimal None   Left PERRL 3 3 Round Minimal None       Visual Fields (Counting fingers)      Left Right    Full Full       Extraocular Movement      Right Left    Full Full       Neuro/Psych    Oriented x3: Yes   Mood/Affect: Normal       Dilation    Both eyes: 1.0% Mydriacyl, 2.5% Phenylephrine @ 1:06 PM        Slit Lamp and Fundus Exam    External Exam      Right Left   External Normal Normal       Slit Lamp Exam      Right Left   Lids/Lashes Normal Normal   Conjunctiva/Sclera White and quiet White and quiet   Cornea Clear Clear   Anterior Chamber Deep and quiet Deep and quiet   Iris Round and reactive Round and reactive   Lens Posterior chamber intraocular lens Posterior chamber intraocular lens   Anterior Vitreous Normal Normal       Fundus Exam       Right Left   Posterior Vitreous Posterior vitreous detachment Posterior vitreous detachment  Disc Normal Normal   C/D Ratio 0.5 0.55   Macula Epiretinal membrane, severe topo distortion Normal   Vessels Normal Normal   Periphery Normal, retinopexy present superotemporal, no new retinal tears Normal, no retinal tears          IMAGING AND PROCEDURES  Imaging and Procedures for 01/25/20           ASSESSMENT/PLAN:  No problem-specific Assessment & Plan notes found for this encounter.      ICD-10-CM   1. Macular pucker, right eye  H35.371 Color Fundus Photography Optos - OU - Both Eyes    1.  The patient is instructed 3 days prior to surgery which is July 2021  2.  Preoperative delivered to the patient patient proceed to surgical center [facility] January 31, 2020  3.  Ophthalmic Meds Ordered this visit:  No orders of the defined types were placed in this encounter.      No follow-ups on file.  There are no Patient Instructions on file for this visit.   Explained the diagnoses, plan, and follow up with the patient and they expressed understanding.  Patient expressed understanding of the importance of proper follow up care.   Clent Demark Keaghan Bowens M.D. Diseases & Surgery of the Retina and Vitreous Retina & Diabetic [place] 01/25/20     Abbreviations: M myopia (nearsighted); A astigmatism; H hyperopia (farsighted); P presbyopia; Mrx spectacle prescription;  CTL contact lenses; OD right eye; OS left eye; OU both eyes  XT exotropia; ET esotropia; PEK punctate epithelial keratitis; PEE punctate epithelial erosions; DES dry eye syndrome; MGD meibomian gland dysfunction; ATs artificial tears; PFAT's preservative free artificial tears; [place] nuclear sclerotic cataract; PSC posterior subcapsular cataract; ERM epi-retinal membrane; PVD posterior vitreous detachment; RD retinal detachment; DM diabetes mellitus; DR diabetic retinopathy; NPDR non-proliferative diabetic  retinopathy; PDR proliferative diabetic retinopathy; CSME clinically significant macular edema; DME diabetic macular edema; dbh dot blot hemorrhages; CWS cotton wool spot; POAG primary open angle glaucoma; C/D cup-to-disc ratio; HVF humphrey visual field; GVF goldmann visual field; OCT optical coherence tomography; IOP intraocular pressure; BRVO Branch retinal vein occlusion; CRVO central retinal vein occlusion; CRAO central retinal artery occlusion; BRAO branch retinal artery occlusion; RT retinal tear; SB scleral buckle; PPV pars plana vitrectomy; VH Vitreous hemorrhage; PRP panretinal laser photocoagulation; IVK intravitreal kenalog; VMT vitreomacular traction; MH Macular hole;  NVD neovascularization of the disc; NVE neovascularization elsewhere; AREDS age related eye disease study; ARMD age related macular degeneration; POAG primary open angle glaucoma; EBMD epithelial/anterior basement membrane dystrophy; ACIOL anterior chamber intraocular lens; IOL intraocular lens; PCIOL posterior chamber intraocular lens; Phaco/IOL phacoemulsification with intraocular lens placement; [place] photorefractive keratectomy; LASIK laser assisted in situ keratomileusis; HTN hypertension; DM diabetes mellitus; COPD chronic obstructive pulmonary disease

## 2020-01-30 DIAGNOSIS — L820 Inflamed seborrheic keratosis: Secondary | ICD-10-CM | POA: Diagnosis not present

## 2020-01-30 DIAGNOSIS — Z1283 Encounter for screening for malignant neoplasm of skin: Secondary | ICD-10-CM | POA: Diagnosis not present

## 2020-01-30 DIAGNOSIS — X32XXXD Exposure to sunlight, subsequent encounter: Secondary | ICD-10-CM | POA: Diagnosis not present

## 2020-01-30 DIAGNOSIS — L821 Other seborrheic keratosis: Secondary | ICD-10-CM | POA: Diagnosis not present

## 2020-01-30 DIAGNOSIS — L670 Trichorrhexis nodosa: Secondary | ICD-10-CM | POA: Diagnosis not present

## 2020-01-30 DIAGNOSIS — L308 Other specified dermatitis: Secondary | ICD-10-CM | POA: Diagnosis not present

## 2020-01-31 ENCOUNTER — Encounter (INDEPENDENT_AMBULATORY_CARE_PROVIDER_SITE_OTHER): Payer: PPO | Admitting: Ophthalmology

## 2020-01-31 DIAGNOSIS — H35371 Puckering of macula, right eye: Secondary | ICD-10-CM

## 2020-02-01 ENCOUNTER — Encounter (INDEPENDENT_AMBULATORY_CARE_PROVIDER_SITE_OTHER): Payer: Self-pay | Admitting: Ophthalmology

## 2020-02-01 ENCOUNTER — Ambulatory Visit (INDEPENDENT_AMBULATORY_CARE_PROVIDER_SITE_OTHER): Payer: PPO | Admitting: Ophthalmology

## 2020-02-01 ENCOUNTER — Other Ambulatory Visit: Payer: Self-pay

## 2020-02-01 DIAGNOSIS — H35371 Puckering of macula, right eye: Secondary | ICD-10-CM

## 2020-02-01 NOTE — Progress Notes (Signed)
02/01/2020     CHIEF COMPLAINT Patient presents for Post-op Follow-up   HISTORY OF PRESENT ILLNESS: Joseph [name] is a 85 y.o. male who presents to the clinic today for:   HPI    Post-op Follow-up    In right eye.  Vision is stable.          Comments    1 Day POV s/p PPV/MP for ERM OD  Pt denies ocular pain. Pt denies nausea or vomiting following sx.       Last edited by Rockie Neighbours, [place] on 02/01/2020  8:06 AM. (History)      Referring physician: Deland Pretty, MD [place],   [id number]  HISTORICAL INFORMATION:   Selected notes from the MEDICAL RECORD NUMBER       CURRENT MEDICATIONS: Current Outpatient Medications (Ophthalmic Drugs)  Medication Sig  . tobramycin-dexamethasone (TOBRADEX) ophthalmic solution Place 1-2 drops into the right eye in the morning, at noon, in the evening, and at bedtime for 23 days.   No current facility-administered medications for this visit. (Ophthalmic Drugs)   Current Outpatient Medications (Other)  Medication Sig  . albuterol (PROAIR HFA) 108 (90 Base) MCG/ACT inhaler Inhale 2 puffs into the lungs every 6 (six) hours as needed for wheezing or shortness of breath.   Marland Kitchen amLODipine (NORVASC) 10 MG tablet Take 0.5 tablets (5 mg total) by mouth every evening. (Patient taking differently: Take 10 mg by mouth every evening. )  . aspirin 81 MG chewable tablet Chew 81 mg by mouth daily.  . Dutasteride-Tamsulosin HCl (JALYN) 0.5-0.4 MG CAPS Take 1 capsule by mouth daily.   Marland Kitchen levothyroxine (SYNTHROID) 137 MCG tablet Take 137 mcg by mouth daily before breakfast. Was told on 11/06/19 by PCP not to take  . lisinopril (ZESTRIL) 10 MG tablet Take 1 tablet (10 mg total) by mouth in the morning and at bedtime.  . lovastatin (MEVACOR) 40 MG tablet Take 40 mg by mouth daily.   . magnesium oxide (MAG-OX) 400 MG tablet Take 400 mg by mouth daily.  . meloxicam (MOBIC) 7.5 MG tablet Take 7.5 mg by mouth daily as needed  for pain.   . nitroGLYCERIN (NITROSTAT) 0.4 MG SL tablet Place 1 tablet (0.4 mg total) under the tongue every 5 (five) minutes as needed for chest pain.  Marland Kitchen omeprazole (PRILOSEC) 40 MG capsule Take 40 mg by mouth daily before breakfast.  . umeclidinium-vilanterol (ANORO ELLIPTA) 62.5-25 MCG/INH AEPB Inhale 1 puff into the lungs daily.   No current facility-administered medications for this visit. (Other)      REVIEW OF SYSTEMS:    ALLERGIES Allergies  Allergen Reactions  . Ciprofloxacin Anaphylaxis and Other (See Comments)    Caused sores & blisters and a trip to the hospital- was also taking Aldara at the same time, however  . Ciprocin-Fluocin-Procin [Fluocinolone Acetonide] Other (See Comments)    Blisters and sores  . Fluocinolone Acetonide Other (See Comments)    Reaction not recalled  . Quinolones Other (See Comments)    Pt had to go to hospital (sores and blisters)  . Statins Other (See Comments)    Made the legs hurt  . Imiquimod Other (See Comments)    (Aldara) Caused sores that landed him in the hospital for 3 days- was taking Cipro at the same time, however  . Penicillins Itching, Rash and Other (See Comments)    Has patient had a PCN reaction causing immediate rash, facial/tongue/throat swelling, SOB or lightheadedness  with hypotension: Yes Has patient had a PCN reaction causing severe rash involving mucus membranes or skin necrosis: No Has patient had a PCN reaction that required hospitalization: No Has patient had a PCN reaction occurring within the last 10 years: No If all of the above answers are "NO", then may proceed with Cephalosporin use.     PAST MEDICAL HISTORY Past Medical History:  Diagnosis Date  . Arthritis   . COPD (chronic obstructive pulmonary disease) ([place])   . Dyspnea    on exertion  . Hyperlipidemia   . Hypertension   . Pneumothorax, spontaneous, tension 1960  . SOB (shortness of breath) on exertion    Past Surgical History:  Procedure  Laterality Date  . EYE SURGERY     bilateral cataract with lens implants  . LEFT HEART CATH AND CORONARY ANGIOGRAPHY N/A 11/28/2019   Procedure: LEFT HEART CATH AND CORONARY ANGIOGRAPHY;  Surgeon: Nigel Mormon, MD;  Location: [facility];  Service: Cardiovascular;  Laterality: N/A;  . LUNG SURGERY     40 years ago  . TOE SURGERY    . TOTAL KNEE ARTHROPLASTY Left 10/06/2017   Procedure: LEFT TOTAL KNEE ARTHROPLASTY;  Surgeon: Susa Day, MD;  Location: [facility];  Service: Orthopedics;  Laterality: Left;  Adductor Block    FAMILY HISTORY History reviewed. No pertinent family history.  SOCIAL HISTORY Social History   Tobacco Use  . Smoking status: Former Smoker    Packs/day: 1.00    Years: 5.00    Pack years: 5.00    Types: Cigarettes    Quit date: 11/24/1958    Years since quitting: 61.2  . Smokeless tobacco: Never Used  Vaping Use  . Vaping Use: Never used  Substance Use Topics  . Alcohol use: No  . Drug use: No         OPHTHALMIC EXAM:  Base Eye Exam    Visual Acuity (ETDRS)      Right Left   Dist [place] 20/80 +2 20/25 -2   Dist ph [place] NI        Tonometry (Tonopen, 8:12 AM)      Right Left   Pressure 05 10       Pupils      Dark Light Shape React APD   Right 8 8 Round dilated None   Left 4 3 Round Brisk None       Extraocular Movement      Right Left    Full Full       Neuro/Psych    Oriented x3: Yes   Mood/Affect: Normal       Dilation    Right eye: 1.0% Mydriacyl, 2.5% Phenylephrine @ 8:12 AM        Slit Lamp and Fundus Exam    External Exam      Right Left   External Normal Normal       Slit Lamp Exam      Right Left   Lids/Lashes Normal Normal   Conjunctiva/Sclera White and quiet White and quiet   Cornea Clear Clear   Anterior Chamber Deep and quiet Deep and quiet   Iris Round and reactive Round and reactive   Lens Posterior chamber intraocular lens Posterior chamber intraocular lens   Anterior Vitreous Normal Normal        Fundus Exam      Right Left   Posterior Vitreous Vitrectomized, very mild haze.    Disc Normal    C/D Ratio 0.5  Macula Less less topo distortion    Vessels Normal    Periphery Normal, retinopexy present superotemporal, no new retinal tears           IMAGING AND PROCEDURES  Imaging and Procedures for 02/01/20           ASSESSMENT/PLAN:  No problem-specific Assessment & Plan notes found for this encounter.    No diagnosis found.  1. Start Tobradex in the right eye four times a day.  2.  3.  Ophthalmic Meds Ordered this visit:  No orders of the defined types were placed in this encounter.      Return in about 1 week (around 02/08/2020) for POST OP, OD, OCT.  There are no Patient Instructions on file for this visit.   Explained the diagnoses, plan, and follow up with the patient and they expressed understanding.  Patient expressed understanding of the importance of proper follow up care.   Clent Demark Temisha Murley M.D. Diseases & Surgery of the Retina and Vitreous Retina & Diabetic [place] 02/01/20     Abbreviations: M myopia (nearsighted); A astigmatism; H hyperopia (farsighted); P presbyopia; Mrx spectacle prescription;  CTL contact lenses; OD right eye; OS left eye; OU both eyes  XT exotropia; ET esotropia; PEK punctate epithelial keratitis; PEE punctate epithelial erosions; DES dry eye syndrome; MGD meibomian gland dysfunction; ATs artificial tears; PFAT's preservative free artificial tears; [place] nuclear sclerotic cataract; PSC posterior subcapsular cataract; ERM epi-retinal membrane; PVD posterior vitreous detachment; RD retinal detachment; DM diabetes mellitus; DR diabetic retinopathy; NPDR non-proliferative diabetic retinopathy; PDR proliferative diabetic retinopathy; CSME clinically significant macular edema; DME diabetic macular edema; dbh dot blot hemorrhages; CWS cotton wool spot; POAG primary open angle glaucoma; C/D cup-to-disc ratio; HVF humphrey  visual field; GVF goldmann visual field; OCT optical coherence tomography; IOP intraocular pressure; BRVO Branch retinal vein occlusion; CRVO central retinal vein occlusion; CRAO central retinal artery occlusion; BRAO branch retinal artery occlusion; RT retinal tear; SB scleral buckle; PPV pars plana vitrectomy; VH Vitreous hemorrhage; PRP panretinal laser photocoagulation; IVK intravitreal kenalog; VMT vitreomacular traction; MH Macular hole;  NVD neovascularization of the disc; NVE neovascularization elsewhere; AREDS age related eye disease study; ARMD age related macular degeneration; POAG primary open angle glaucoma; EBMD epithelial/anterior basement membrane dystrophy; ACIOL anterior chamber intraocular lens; IOL intraocular lens; PCIOL posterior chamber intraocular lens; Phaco/IOL phacoemulsification with intraocular lens placement; [place] photorefractive keratectomy; LASIK laser assisted in situ keratomileusis; HTN hypertension; DM diabetes mellitus; COPD chronic obstructive pulmonary disease

## 2020-02-03 DIAGNOSIS — I10 Essential (primary) hypertension: Secondary | ICD-10-CM | POA: Diagnosis not present

## 2020-02-08 ENCOUNTER — Encounter (INDEPENDENT_AMBULATORY_CARE_PROVIDER_SITE_OTHER): Payer: Self-pay | Admitting: Ophthalmology

## 2020-02-08 ENCOUNTER — Ambulatory Visit (INDEPENDENT_AMBULATORY_CARE_PROVIDER_SITE_OTHER): Payer: PPO | Admitting: Ophthalmology

## 2020-02-08 ENCOUNTER — Other Ambulatory Visit: Payer: Self-pay

## 2020-02-08 ENCOUNTER — Other Ambulatory Visit: Payer: Self-pay | Admitting: Cardiology

## 2020-02-08 DIAGNOSIS — H35371 Puckering of macula, right eye: Secondary | ICD-10-CM

## 2020-02-08 DIAGNOSIS — Z09 Encounter for follow-up examination after completed treatment for conditions other than malignant neoplasm: Secondary | ICD-10-CM

## 2020-02-08 DIAGNOSIS — I10 Essential (primary) hypertension: Secondary | ICD-10-CM

## 2020-02-08 NOTE — Assessment & Plan Note (Signed)
No lifting and bending for 1 week. No water in the eye for 10 days. Do not rub the eye. Wear shield at night for 1-3 days.  Wear your CPAP as normal, if instructed by your doctor.  Continue your topical medications for a total of 3 weeks.  Do not refill your postoperative medications unless instructed.  Complete meds in next two weeks.   Anatomy vastly improved.

## 2020-02-08 NOTE — Progress Notes (Signed)
02/08/2020     CHIEF COMPLAINT Patient presents for Post-op Follow-up   HISTORY OF PRESENT ILLNESS: Joseph [name] is a 85 y.o. male who presents to the clinic today for:   HPI    Post-op Follow-up    In right eye.  Discomfort includes none.  Negative for pain.  Vision is stable.  I, the attending physician,  performed the HPI with the patient and updated documentation appropriately.          Comments    1 Week s\p Vitrectomy and Membrane Peel OD for ERM  Pt states vision is stable. Denies and pain. Using gtts as directed       Last edited by Tilda Franco on 02/08/2020  9:59 AM. (History)      Referring physician: Deland Pretty, MD [place],  [place] [address]  HISTORICAL INFORMATION:   Selected notes from the MEDICAL RECORD NUMBER       CURRENT MEDICATIONS: Current Outpatient Medications (Ophthalmic Drugs)  Medication Sig  . tobramycin-dexamethasone (TOBRADEX) ophthalmic solution Place 1-2 drops into the right eye in the morning, at noon, in the evening, and at bedtime for 23 days.   No current facility-administered medications for this visit. (Ophthalmic Drugs)   Current Outpatient Medications (Other)  Medication Sig  . albuterol (PROAIR HFA) 108 (90 Base) MCG/ACT inhaler Inhale 2 puffs into the lungs every 6 (six) hours as needed for wheezing or shortness of breath.   Marland Kitchen amLODipine (NORVASC) 10 MG tablet Take 0.5 tablets (5 mg total) by mouth every evening. (Patient taking differently: Take 10 mg by mouth every evening. )  . aspirin 81 MG chewable tablet Chew 81 mg by mouth daily.  . Dutasteride-Tamsulosin HCl (JALYN) 0.5-0.4 MG CAPS Take 1 capsule by mouth daily.   Marland Kitchen levothyroxine (SYNTHROID) 137 MCG tablet Take 137 mcg by mouth daily before breakfast. Was told on 11/06/19 by PCP not to take  . lisinopril (ZESTRIL) 10 MG tablet TAKE 1 TABLET(10 MG) BY MOUTH IN THE MORNING AND AT BEDTIME  . lovastatin (MEVACOR) 40 MG tablet Take 40 mg  by mouth daily.   . magnesium oxide (MAG-OX) 400 MG tablet Take 400 mg by mouth daily.  . meloxicam (MOBIC) 7.5 MG tablet Take 7.5 mg by mouth daily as needed for pain.   . nitroGLYCERIN (NITROSTAT) 0.4 MG SL tablet Place 1 tablet (0.4 mg total) under the tongue every 5 (five) minutes as needed for chest pain.  Marland Kitchen omeprazole (PRILOSEC) 40 MG capsule Take 40 mg by mouth daily before breakfast.  . umeclidinium-vilanterol (ANORO ELLIPTA) 62.5-25 MCG/INH AEPB Inhale 1 puff into the lungs daily.   No current facility-administered medications for this visit. (Other)      REVIEW OF SYSTEMS:    ALLERGIES Allergies  Allergen Reactions  . Ciprofloxacin Anaphylaxis and Other (See Comments)    Caused sores & blisters and a trip to the hospital- was also taking Aldara at the same time, however  . Ciprocin-Fluocin-Procin [Fluocinolone Acetonide] Other (See Comments)    Blisters and sores  . Fluocinolone Acetonide Other (See Comments)    Reaction not recalled  . Quinolones Other (See Comments)    Pt had to go to hospital (sores and blisters)  . Statins Other (See Comments)    Made the legs hurt  . Imiquimod Other (See Comments)    (Aldara) Caused sores that landed him in the hospital for 3 days- was taking Cipro at the same time, however  .  Penicillins Itching, Rash and Other (See Comments)    Has patient had a PCN reaction causing immediate rash, facial/tongue/throat swelling, SOB or lightheadedness with hypotension: Yes Has patient had a PCN reaction causing severe rash involving mucus membranes or skin necrosis: No Has patient had a PCN reaction that required hospitalization: No Has patient had a PCN reaction occurring within the last 10 years: No If all of the above answers are "NO", then may proceed with Cephalosporin use.     PAST MEDICAL HISTORY Past Medical History:  Diagnosis Date  . Arthritis   . COPD (chronic obstructive pulmonary disease) ([place])   . Dyspnea    on exertion  .  Hyperlipidemia   . Hypertension   . Pneumothorax, spontaneous, tension 1960  . SOB (shortness of breath) on exertion    Past Surgical History:  Procedure Laterality Date  . EYE SURGERY     bilateral cataract with lens implants  . LEFT HEART CATH AND CORONARY ANGIOGRAPHY N/A 11/28/2019   Procedure: LEFT HEART CATH AND CORONARY ANGIOGRAPHY;  Surgeon: Nigel Mormon, MD;  Location: [facility];  Service: Cardiovascular;  Laterality: N/A;  . LUNG SURGERY     40 years ago  . TOE SURGERY    . TOTAL KNEE ARTHROPLASTY Left 10/06/2017   Procedure: LEFT TOTAL KNEE ARTHROPLASTY;  Surgeon: Susa Day, MD;  Location: [facility];  Service: Orthopedics;  Laterality: Left;  Adductor Block    FAMILY HISTORY History reviewed. No pertinent family history.  SOCIAL HISTORY Social History   Tobacco Use  . Smoking status: Former Smoker    Packs/day: 1.00    Years: 5.00    Pack years: 5.00    Types: Cigarettes    Quit date: 11/24/1958    Years since quitting: 61.2  . Smokeless tobacco: Never Used  Vaping Use  . Vaping Use: Never used  Substance Use Topics  . Alcohol use: No  . Drug use: No         OPHTHALMIC EXAM:  Base Eye Exam    Visual Acuity (Snellen - Linear)      Right Left   Dist [place] 20/40 -2 20/30 -2   Dist ph  NI 20/25 -1       Tonometry (Tonopen, 10:05 AM)      Right Left   Pressure 14 10       Pupils      Pupils Dark Light Shape React APD   Right PERRL 3 3 Round Minimal None   Left PERRL 3 3 Round Minimal None       Visual Fields (Counting fingers)      Left Right    Full Full       Neuro/Psych    Oriented x3: Yes   Mood/Affect: Normal       Dilation    Right eye: 1.0% Mydriacyl, 2.5% Phenylephrine @ 10:05 AM        Slit Lamp and Fundus Exam    External Exam      Right Left   External Normal Normal       Slit Lamp Exam      Right Left   Lids/Lashes Normal Normal   Conjunctiva/Sclera White and quiet White and quiet   Cornea Clear  Clear   Anterior Chamber Deep and quiet Deep and quiet   Iris Round and reactive Round and reactive   Lens Posterior chamber intraocular lens Posterior chamber intraocular lens   Anterior Vitreous Normal Normal  Fundus Exam      Right Left   Posterior Vitreous Vitrectomized, clear    Disc Normal    C/D Ratio 0.5    Macula Less topo distortion    Vessels Normal    Periphery Normal, retinopexy present superotemporal, no new retinal tears           IMAGING AND PROCEDURES  Imaging and Procedures for 02/08/20  OCT, Retina - OU - Both Eyes       Right Eye Quality was good. Scan locations included subfoveal.   Left Eye Quality was good. Scan locations included subfoveal.                 ASSESSMENT/PLAN:  Macular pucker, right eye No lifting and bending for 1 week. No water in the eye for 10 days. Do not rub the eye. Wear shield at night for 1-3 days.  Wear your CPAP as normal, if instructed by your doctor.  Continue your topical medications for a total of 3 weeks.  Do not refill your postoperative medications unless instructed.  Complete meds in next two weeks.   Anatomy vastly improved.      ICD-10-CM   1. Macular pucker, right eye  H35.371 OCT, Retina - OU - Both Eyes  2. Follow-up examination after eye surgery  Z09     1.  2.  3.  Ophthalmic Meds Ordered this visit:  No orders of the defined types were placed in this encounter.      Return in about 8 weeks (around 04/04/2020) for dilate, OD, OCT, POST OP.  There are no Patient Instructions on file for this visit.   Explained the diagnoses, plan, and follow up with the patient and they expressed understanding.  Patient expressed understanding of the importance of proper follow up care.   Clent Demark Aydin Cavalieri M.D. Diseases & Surgery of the Retina and Vitreous Retina & Diabetic [place] 02/08/20     Abbreviations: M myopia (nearsighted); A astigmatism; H hyperopia (farsighted); P presbyopia;  Mrx spectacle prescription;  CTL contact lenses; OD right eye; OS left eye; OU both eyes  XT exotropia; ET esotropia; PEK punctate epithelial keratitis; PEE punctate epithelial erosions; DES dry eye syndrome; MGD meibomian gland dysfunction; ATs artificial tears; PFAT's preservative free artificial tears; [place] nuclear sclerotic cataract; PSC posterior subcapsular cataract; ERM epi-retinal membrane; PVD posterior vitreous detachment; RD retinal detachment; DM diabetes mellitus; DR diabetic retinopathy; NPDR non-proliferative diabetic retinopathy; PDR proliferative diabetic retinopathy; CSME clinically significant macular edema; DME diabetic macular edema; dbh dot blot hemorrhages; CWS cotton wool spot; POAG primary open angle glaucoma; C/D cup-to-disc ratio; HVF humphrey visual field; GVF goldmann visual field; OCT optical coherence tomography; IOP intraocular pressure; BRVO Branch retinal vein occlusion; CRVO central retinal vein occlusion; CRAO central retinal artery occlusion; BRAO branch retinal artery occlusion; RT retinal tear; SB scleral buckle; PPV pars plana vitrectomy; VH Vitreous hemorrhage; PRP panretinal laser photocoagulation; IVK intravitreal kenalog; VMT vitreomacular traction; MH Macular hole;  NVD neovascularization of the disc; NVE neovascularization elsewhere; AREDS age related eye disease study; ARMD age related macular degeneration; POAG primary open angle glaucoma; EBMD epithelial/anterior basement membrane dystrophy; ACIOL anterior chamber intraocular lens; IOL intraocular lens; PCIOL posterior chamber intraocular lens; Phaco/IOL phacoemulsification with intraocular lens placement; [place] photorefractive keratectomy; LASIK laser assisted in situ keratomileusis; HTN hypertension; DM diabetes mellitus; COPD chronic obstructive pulmonary disease

## 2020-02-14 ENCOUNTER — Other Ambulatory Visit: Payer: Self-pay | Admitting: Cardiology

## 2020-02-14 DIAGNOSIS — I10 Essential (primary) hypertension: Secondary | ICD-10-CM

## 2020-02-20 DIAGNOSIS — E039 Hypothyroidism, unspecified: Secondary | ICD-10-CM | POA: Diagnosis not present

## 2020-02-20 DIAGNOSIS — Z0000 Encounter for general adult medical examination without abnormal findings: Secondary | ICD-10-CM | POA: Diagnosis not present

## 2020-02-20 DIAGNOSIS — I10 Essential (primary) hypertension: Secondary | ICD-10-CM | POA: Diagnosis not present

## 2020-02-20 DIAGNOSIS — K219 Gastro-esophageal reflux disease without esophagitis: Secondary | ICD-10-CM | POA: Diagnosis not present

## 2020-02-20 DIAGNOSIS — R7301 Impaired fasting glucose: Secondary | ICD-10-CM | POA: Diagnosis not present

## 2020-03-05 DIAGNOSIS — I10 Essential (primary) hypertension: Secondary | ICD-10-CM | POA: Diagnosis not present

## 2020-03-08 ENCOUNTER — Ambulatory Visit: Payer: PPO | Admitting: Cardiology

## 2020-03-08 DIAGNOSIS — R7303 Prediabetes: Secondary | ICD-10-CM | POA: Diagnosis not present

## 2020-03-08 DIAGNOSIS — I10 Essential (primary) hypertension: Secondary | ICD-10-CM | POA: Diagnosis not present

## 2020-03-15 ENCOUNTER — Encounter: Payer: Self-pay | Admitting: Cardiology

## 2020-03-15 ENCOUNTER — Ambulatory Visit: Payer: PPO | Admitting: Cardiology

## 2020-03-15 ENCOUNTER — Other Ambulatory Visit: Payer: Self-pay

## 2020-03-15 VITALS — BP 125/73 | HR 79 | Ht 72.0 in | Wt 186.0 lb

## 2020-03-15 DIAGNOSIS — R0609 Other forms of dyspnea: Secondary | ICD-10-CM

## 2020-03-15 DIAGNOSIS — E782 Mixed hyperlipidemia: Secondary | ICD-10-CM

## 2020-03-15 DIAGNOSIS — Z87891 Personal history of nicotine dependence: Secondary | ICD-10-CM | POA: Diagnosis not present

## 2020-03-15 DIAGNOSIS — I129 Hypertensive chronic kidney disease with stage 1 through stage 4 chronic kidney disease, or unspecified chronic kidney disease: Secondary | ICD-10-CM | POA: Diagnosis not present

## 2020-03-15 DIAGNOSIS — N183 Chronic kidney disease, stage 3 unspecified: Secondary | ICD-10-CM | POA: Diagnosis not present

## 2020-03-15 NOTE — Progress Notes (Signed)
 Joseph [name] Date of Birth: 05/27/1935 MRN: [id number] Primary Care Provider:Pharr, Aleksandr, MD  Date: 03/15/20 Last Office Visit: 12/07/2019  Chief Complaint  Patient presents with  . Shortness of Breath    HPI  Joseph [name] is a 85 y.o.  male who presents to the office with a chief complaint of " evaluation of shortness of breath." Patient's past medical history and cardiovascular risk factors include: Hypertension, hyperlipidemia, advanced age,former smoker, asthma.  Patient has undergone an extensive cardiovascular evaluation in the recent past due to symptoms of chest discomfort and shortness of breath.  He has undergone an echocardiogram, stress test and left heart catheterization findings which are noted below.  At the last office visit patient was recommended to discuss noncardiac causes of dyspnea with his PCP and possibly consider pulmonary evaluation.  Since last visit patient states that his symptoms of effort related dyspnea has improved majority of the time but does have few bad days.  He has not seen pulmonary since her last office visit.  Patient states that he uses his inhalers regularly as prescribed which helps improve his symptoms.  He does have underlying asthma.  His overall functional status remains stable since last visit.  He has not used sublingual nitroglycerin tablets.  No recent ER or urgent care visits for cardiovascular symptoms.  At the last office visit we discussed possibly undergoing a Zio patch again to reevaluate for underlying dysrhythmias.  However, patient states that his symptoms have overall remained stable and would like to continue the current management.  FUNCTIONAL STATUS: walks 1.5 miles on a daily basis.   ALLERGIES: Allergies  Allergen Reactions  . Ciprofloxacin Anaphylaxis and Other (See Comments)    Caused sores & blisters and a trip to the hospital- was also taking Aldara at the same time, however  . Ciprocin-Fluocin-Procin  [Fluocinolone Acetonide] Other (See Comments)    Blisters and sores  . Fluocinolone Acetonide Other (See Comments)    Reaction not recalled  . Quinolones Other (See Comments)    Pt had to go to hospital (sores and blisters)  . Statins Other (See Comments)    Made the legs hurt  . Imiquimod Other (See Comments)    (Aldara) Caused sores that landed him in the hospital for 3 days- was taking Cipro at the same time, however  . Penicillins Itching, Rash and Other (See Comments)    Has patient had a PCN reaction causing immediate rash, facial/tongue/throat swelling, SOB or lightheadedness with hypotension: Yes Has patient had a PCN reaction causing severe rash involving mucus membranes or skin necrosis: No Has patient had a PCN reaction that required hospitalization: No Has patient had a PCN reaction occurring within the last 10 years: No If all of the above answers are "NO", then may proceed with Cephalosporin use.      MEDICATION LIST PRIOR TO VISIT: Current Outpatient Medications on File Prior to Visit  Medication Sig Dispense Refill  . albuterol (PROAIR HFA) 108 (90 Base) MCG/ACT inhaler Inhale 2 puffs into the lungs every 6 (six) hours as needed for wheezing or shortness of breath.     . amLODipine (NORVASC) 10 MG tablet Take 10 mg by mouth daily.    . aspirin 81 MG chewable tablet Chew 81 mg by mouth daily.    . Dutasteride-Tamsulosin HCl (JALYN) 0.5-0.4 MG CAPS Take 1 capsule by mouth daily.     . levothyroxine (SYNTHROID) 137 MCG tablet Take 137 mcg by mouth daily before breakfast. Was   told on 11/06/19 by PCP not to take    . lisinopril (ZESTRIL) 10 MG tablet TAKE 1 TABLET(10 MG) BY MOUTH IN THE MORNING AND AT BEDTIME 180 tablet 0  . lovastatin (MEVACOR) 40 MG tablet Take 40 mg by mouth daily.     . magnesium oxide (MAG-OX) 400 MG tablet Take 400 mg by mouth daily.    . meloxicam (MOBIC) 7.5 MG tablet Take 7.5 mg by mouth daily as needed for pain.     . omeprazole (PRILOSEC) 40 MG  capsule Take 40 mg by mouth daily before breakfast.    . Fluticasone-Umeclidin-Vilant (TRELEGY ELLIPTA) 100-62.5-25 MCG/INH AEPB Inhale into the lungs.    . nitroGLYCERIN (NITROSTAT) 0.4 MG SL tablet Place 1 tablet (0.4 mg total) under the tongue every 5 (five) minutes as needed for chest pain. 30 tablet 3  . umeclidinium-vilanterol (ANORO ELLIPTA) 62.5-25 MCG/INH AEPB Inhale 1 puff into the lungs daily. (Patient not taking: Reported on 03/15/2020)     No current facility-administered medications on file prior to visit.    PAST MEDICAL HISTORY: Past Medical History:  Diagnosis Date  . Arthritis   . COPD (chronic obstructive pulmonary disease) (HCC)   . Dyspnea    on exertion  . Hyperlipidemia   . Hypertension   . Pneumothorax, spontaneous, tension 1960  . SOB (shortness of breath) on exertion     PAST SURGICAL HISTORY: Past Surgical History:  Procedure Laterality Date  . EYE SURGERY     bilateral cataract with lens implants  . LEFT HEART CATH AND CORONARY ANGIOGRAPHY N/A 11/28/2019   Procedure: LEFT HEART CATH AND CORONARY ANGIOGRAPHY;  Surgeon: Patwardhan, Manish J, MD;  Location: [facility];  Service: Cardiovascular;  Laterality: N/A;  . LUNG SURGERY     40 years ago  . TOE SURGERY    . TOTAL KNEE ARTHROPLASTY Left 10/06/2017   Procedure: LEFT TOTAL KNEE ARTHROPLASTY;  Surgeon: Beane, Jeffrey, MD;  Location: [facility];  Service: Orthopedics;  Laterality: Left;  Adductor Block    FAMILY HISTORY: The patient's family history is not on file.   SOCIAL HISTORY:  The patient  reports that he quit smoking about 61 years ago. His smoking use included cigarettes. He has a 5.00 pack-year smoking history. He has never used smokeless tobacco. He reports that he does not drink alcohol and does not use drugs.  Review of Systems  Constitutional: Positive for malaise/fatigue (stable). Negative for chills and fever.  HENT: Negative for ear discharge, ear pain and nosebleeds.   Eyes:  Negative for blurred vision and discharge.  Cardiovascular: Positive for dyspnea on exertion (improved. ). Negative for chest pain, claudication, leg swelling, near-syncope, orthopnea, palpitations, paroxysmal nocturnal dyspnea and syncope.  Respiratory: Positive for snoring. Negative for cough and shortness of breath.   Endocrine: Negative for polydipsia, polyphagia and polyuria.  Hematologic/Lymphatic: Negative for bleeding problem.  Skin: Negative for flushing and nail changes.  Musculoskeletal: Negative for muscle cramps, muscle weakness and myalgias.  Gastrointestinal: Negative for abdominal pain, dysphagia, hematemesis, hematochezia, melena, nausea and vomiting.  Neurological: Negative for dizziness, focal weakness and light-headedness.    PHYSICAL EXAM: Vitals with BMI 03/15/2020 12/07/2019 11/28/2019  Height 6' 0" 6' 0" -  Weight 186 lbs 190 lbs -  BMI 25.22 25.76 -  Systolic 125 128 131  Diastolic 73 68 68  Pulse 79 64 72   Orthostatic VS for the past 72 hrs (Last 3 readings):  Patient Position BP Location Cuff Size  03/15/20   1001 Sitting Left Arm Normal    CONSTITUTIONAL: Well-developed and well-nourished. No acute distress.  SKIN: Skin is warm and dry. No rash noted. No cyanosis. No pallor. No jaundice HEAD: Normocephalic and atraumatic.  EYES: No scleral icterus MOUTH/THROAT: Moist oral membranes.  NECK: No JVD present. No thyromegaly noted. No carotid bruits  LYMPHATIC: No visible cervical adenopathy.  CHEST Normal respiratory effort. No intercostal retractions  LUNGS: Expiratory wheezes noted in the right posterior chest wall.  Equal rise and fall in chest cavity.  No rales or rhonchi's. CARDIOVASCULAR: Regular rate and rhythm, positive S1-S2, no murmurs rubs or gallops appreciated. ABDOMINAL: Soft, nontender, nondistended, positive bowel sounds in all 4 quadrants.  No apparent ascites.  EXTREMITIES: No peripheral edema, compression stockings present. HEMATOLOGIC: No  significant bruising NEUROLOGIC: Oriented to person, place, and time. Nonfocal. Normal muscle tone.  PSYCHIATRIC: Normal mood and affect. Normal behavior. Cooperative  EKG: 09/08/2019 0937: Normal sinus rhythm with a ventricular rate of 82 bpm, normal axis deviation, no underlying ischemia or injury pattern. 45/10/2019: Normal sinus rhythm, 72 bpm, normal axis, without underlying ischemia or injury pattern.  Echocardiogram: 09/2019: LVEF 55-60%, mild LVH, normal diastolic filling pressures, normal left atrial pressures, mildly dilated left atrium, mild MR, mild TR. Large liver cyst (6.9x7.8cm) noted. Consider dedicated imaging, if clinically indicated.  Stress Testing:  Lexiscan 09/11/2019: Nondiagnostic ECG stress. The baseline blood pressure was 80/54 mmHg and decreased to 78/54 mmHg at peak infusion, which is a physiologic response to Intravenous Lexiscan. Patient asymptomatic. Resting EKG demonstrated normal sinus rhythm. Non-specific ST-T abnormality. Peak EKG revealed no significant ST-T change from baseline abnormality. Myocardial perfusion is normal. Stress LV EF: 52%. No previous exam available for comparison. Low risk study.   Heart Catheterization: 11/28/2019 by Dr. Joya Gaskins Patwardhan: Normal epicardial coronary arteries.  Normal LVEDP.  Carotid Duplex: 56/70/1410: Peak systolic velocities in the right bifurcation, internal, external and common carotid arteries are within normal limits. Minimal stenosis in the left internal carotid artery (minimal) with homogenous plaque.  Right vertebral artery flow is not visualized. Antegrade left vertebral artery flow.  14 - day [facility] Tele[facility]. Enrollment Period: 10/18/2019-10/31/2019 Predominant rhythm normal sinus with heart rate ranging from 45 - 121 bpm, average heart rate 69 bpm. No pauses greater than or equal to 2.5 seconds. No atrial fibrillation detected during the monitoring period.   Total supraventricular ectopic  burden <1% (323 supraventricular ectopy, 16 supraventricular runs). Longest run was 22 beats in duration at 162 bpm. The fastest run was 3 beats in duration at 171 bpm. Total ventricular ectopic burden <1% (6925 ventricular ectopy, 0 ventricular pairs and 0 ventricular runs). Heart rate < 60 bpm for 27.8% of the recording. Heart rate > 100 bpm for 3.4% of the recording. No Auto triggered events.   21 Patient triggered events reviewed, not associated with any significant arrhythmia (baseline artifact in many tracings).  Recent labs: 09/08/2019: Glucose 110, BUN/Cr 130.93 EGFR >60. Na/K 140/4.1.  H/H 15.6/48.8.   LABORATORY DATA: CBC Latest Ref Rng & Units 11/28/2019 09/08/2019 06/09/2018  WBC 4.0 - 10.5 K/uL 9.1 7.0 6.1  Hemoglobin 13.0 - 17.0 g/dL 13.9 15.6 14.6  Hematocrit 39 - 52 % 42.3 48.8 44.4  Platelets 150 - 400 K/uL 193 178 149    CMP Latest Ref Rng & Units 11/24/2019 10/05/2019 09/25/2019  Glucose 65 - 99 mg/dL 105(H) 115(H) 121(H)  BUN 8 - 27 mg/dL _0 Creatinine 0.76 - 1.27 mg/dL 0.96 1.06 1.16  Sodium  134 - 144 mmol/L 141 140 141  Potassium 3.5 - 5.2 mmol/L 4.6 4.4 3.6  Chloride 96 - 106 mmol/L 104 100 100  CO2 20 - 29 mmol/L 18(L) 22 24  Calcium 8.6 - 10.2 mg/dL 9.9 9.9 9.7  Total Protein 6.1 - 8.1 g/dL - - -  Total Bilirubin 0.2 - 1.2 mg/dL - - -  Alkaline Phos 39 - 117 U/L - - -  AST 10 - 35 U/L - - -  ALT 9 - 46 U/L - - -    Lipid Panel     Component Value Date/Time   CHOL 139 09/25/2019 0813   TRIG 122 09/25/2019 0813   HDL 45 09/25/2019 0813   LDLCALC 72 09/25/2019 0813   LABVLDL 22 09/25/2019 0813   FINAL MEDICATION LIST END OF ENCOUNTER: No orders of the defined types were placed in this encounter.   Medications Discontinued During This Encounter  Medication Reason  . amLODipine (NORVASC) 10 MG tablet Dose change     Current Outpatient Medications:  .  albuterol (PROAIR HFA) 108 (90 Base) MCG/ACT inhaler, Inhale 2 puffs into the lungs every 6  (six) hours as needed for wheezing or shortness of breath. , Disp: , Rfl:  .  amLODipine (NORVASC) 10 MG tablet, Take 10 mg by mouth daily., Disp: , Rfl:  .  aspirin 81 MG chewable tablet, Chew 81 mg by mouth daily., Disp: , Rfl:  .  Dutasteride-Tamsulosin HCl (JALYN) 0.5-0.4 MG CAPS, Take 1 capsule by mouth daily. , Disp: , Rfl:  .  levothyroxine (SYNTHROID) 137 MCG tablet, Take 137 mcg by mouth daily before breakfast. Was told on 11/06/19 by PCP not to take, Disp: , Rfl:  .  lisinopril (ZESTRIL) 10 MG tablet, TAKE 1 TABLET(10 MG) BY MOUTH IN THE MORNING AND AT BEDTIME, Disp: 180 tablet, Rfl: 0 .  lovastatin (MEVACOR) 40 MG tablet, Take 40 mg by mouth daily. , Disp: , Rfl:  .  magnesium oxide (MAG-OX) 400 MG tablet, Take 400 mg by mouth daily., Disp: , Rfl:  .  meloxicam (MOBIC) 7.5 MG tablet, Take 7.5 mg by mouth daily as needed for pain. , Disp: , Rfl:  .  omeprazole (PRILOSEC) 40 MG capsule, Take 40 mg by mouth daily before breakfast., Disp: , Rfl:  .  Fluticasone-Umeclidin-Vilant (TRELEGY ELLIPTA) 100-62.5-25 MCG/INH AEPB, Inhale into the lungs., Disp: , Rfl:  .  nitroGLYCERIN (NITROSTAT) 0.4 MG SL tablet, Place 1 tablet (0.4 mg total) under the tongue every 5 (five) minutes as needed for chest pain., Disp: 30 tablet, Rfl: 3 .  umeclidinium-vilanterol (ANORO ELLIPTA) 62.5-25 MCG/INH AEPB, Inhale 1 puff into the lungs daily. (Patient not taking: Reported on 03/15/2020), Disp: , Rfl:   IMPRESSION:    ICD-10-CM   1. Dyspnea on exertion  R06.00   2. Benign hypertension with CKD (chronic kidney disease) stage III  I12.9    N18.30   3. Mixed hyperlipidemia  E78.2   4. Former smoker  Z87.891      RECOMMENDATIONS: Joseph [name] is a 85 y.o. male whose past medical history and cardiovascular risk factors include: Hypertension, hyperlipidemia, advanced age,former smoker.  Dyspnea on exertion:  From a cardiovascular standpoint patient has undergone an echo, stress test, and invasive  angiography results are stated above for further reference.  Since last office visit patient has not been evaluated by pulmonary medicine as recommended given his history of smoking and underlying asthma which may be contributing to his effort related dyspnea.    Patient states that his symptoms have remained stable since last office visit.  We even entertain the idea of repeating a 14-day monitor to evaluate for any underlying dysrhythmias (such as atrial fibrillation).  However, patient states that symptoms have remained stable he would like to continue his current medical therapy at this time without any additional testing.  We will continue to monitor the symptoms.  Medications reconciled.  Benign essential hypertension . Office blood pressure is at goal.  . Medication reconciled.  . Low salt diet recommended. A diet that is rich in fruits, vegetables, legumes, and low-fat dairy products and low in snacks, sweets, and meats (such as the Dietary Approaches to Stop Hypertension [DASH] diet).   Mixed hyperlipidemia: Continue statin therapy.  Currently managed by primary care provider.  Former smoker: Educated on the importance of continued smoking cessation.   No orders of the defined types were placed in this encounter.  --Continue cardiac medications as reconciled in final medication list. --Return in about 6 months (around 09/12/2020) for Reevaluation of, Dyspnea. Or sooner if needed. --Continue follow-up with your primary care physician regarding the management of your other chronic comorbid conditions.  Patient's questions and concerns were addressed to his satisfaction. He voices understanding of the instructions provided during this encounter.   This note was created using a voice recognition software as a result there may be grammatical errors inadvertently enclosed that do not reflect the nature of this encounter. Every attempt is made to correct such errors.  Total time spent:  20 minutes.  Sunit Tolia, DO, FACC  Pager: [phone] Office: [phone]  

## 2020-03-25 ENCOUNTER — Encounter (INDEPENDENT_AMBULATORY_CARE_PROVIDER_SITE_OTHER): Payer: PPO | Admitting: Ophthalmology

## 2020-03-27 DIAGNOSIS — B078 Other viral warts: Secondary | ICD-10-CM | POA: Diagnosis not present

## 2020-04-04 ENCOUNTER — Encounter (INDEPENDENT_AMBULATORY_CARE_PROVIDER_SITE_OTHER): Payer: Self-pay | Admitting: Ophthalmology

## 2020-04-04 ENCOUNTER — Ambulatory Visit (INDEPENDENT_AMBULATORY_CARE_PROVIDER_SITE_OTHER): Payer: PPO | Admitting: Ophthalmology

## 2020-04-04 ENCOUNTER — Other Ambulatory Visit: Payer: Self-pay

## 2020-04-04 DIAGNOSIS — H353131 Nonexudative age-related macular degeneration, bilateral, early dry stage: Secondary | ICD-10-CM

## 2020-04-04 DIAGNOSIS — H35371 Puckering of macula, right eye: Secondary | ICD-10-CM

## 2020-04-04 NOTE — Progress Notes (Signed)
04/04/2020     CHIEF COMPLAINT Patient presents for Post-op Follow-up   HISTORY OF PRESENT ILLNESS: Joseph [name] is a 85 y.o. male who presents to the clinic today for:   HPI    Post-op Follow-up    In right eye.  Vision is improved.          Comments    8 Week Post Op OD ,   Pt c/o burning OU at night when reading. Pt reports stable VA OU.       Last edited by Hurman Horn, MD on 04/04/2020 11:24 AM. (History)      Referring physician: Deland Pretty, MD [address] [place],  [place] [address]  HISTORICAL INFORMATION:   Selected notes from the [facility] [place]: No current outpatient medications on file. (Ophthalmic Drugs)   No current facility-administered medications for this visit. (Ophthalmic Drugs)   Current Outpatient Medications (Other)  Medication Sig  . albuterol (PROAIR HFA) 108 (90 Base) MCG/ACT inhaler Inhale 2 puffs into the lungs every 6 (six) hours as needed for wheezing or shortness of breath.   Marland Kitchen amLODipine (NORVASC) 10 MG tablet Take 10 mg by mouth daily.  Marland Kitchen aspirin 81 MG chewable tablet Chew 81 mg by mouth daily.  . Dutasteride-Tamsulosin HCl (JALYN) 0.5-0.4 MG CAPS Take 1 capsule by mouth daily.   . Fluticasone-Umeclidin-Vilant (TRELEGY ELLIPTA) 100-62.5-25 MCG/INH AEPB Inhale into the lungs.  Marland Kitchen levothyroxine (SYNTHROID) 137 MCG tablet Take 137 mcg by mouth daily before breakfast. Was told on 11/06/19 by PCP not to take  . lisinopril (ZESTRIL) 10 MG tablet TAKE 1 TABLET(10 MG) BY MOUTH IN THE MORNING AND AT BEDTIME  . lovastatin (MEVACOR) 40 MG tablet Take 40 mg by mouth daily.   . magnesium oxide (MAG-OX) 400 MG tablet Take 400 mg by mouth daily.  . meloxicam (MOBIC) 7.5 MG tablet Take 7.5 mg by mouth daily as needed for pain.   . nitroGLYCERIN (NITROSTAT) 0.4 MG SL tablet Place 1 tablet (0.4 mg total) under the tongue every 5 (five) minutes as needed for chest pain.  Marland Kitchen omeprazole (PRILOSEC) 40  MG capsule Take 40 mg by mouth daily before breakfast.  . umeclidinium-vilanterol (ANORO ELLIPTA) 62.5-25 MCG/INH AEPB Inhale 1 puff into the lungs daily. (Patient not taking: Reported on 03/15/2020)   No current facility-administered medications for this visit. (Other)      REVIEW OF SYSTEMS:    ALLERGIES Allergies  Allergen Reactions  . Ciprofloxacin Anaphylaxis and Other (See Comments)    Caused sores & blisters and a trip to the hospital- was also taking Aldara at the same time, however  . Ciprocin-Fluocin-Procin [Fluocinolone Acetonide] Other (See Comments)    Blisters and sores  . Fluocinolone Acetonide Other (See Comments)    Reaction not recalled  . Quinolones Other (See Comments)    Pt had to go to hospital (sores and blisters)  . Statins Other (See Comments)    Made the legs hurt  . Imiquimod Other (See Comments)    (Aldara) Caused sores that landed him in the hospital for 3 days- was taking Cipro at the same time, however  . Penicillins Itching, Rash and Other (See Comments)    Has patient had a PCN reaction causing immediate rash, facial/tongue/throat swelling, SOB or lightheadedness with hypotension: Yes Has patient had a PCN reaction causing severe rash involving mucus membranes or skin necrosis: No Has patient had a PCN reaction  that required hospitalization: No Has patient had a PCN reaction occurring within the last 10 years: No If all of the above answers are "NO", then may proceed with Cephalosporin use.     PAST MEDICAL HISTORY Past Medical History:  Diagnosis Date  . Arthritis   . COPD (chronic obstructive pulmonary disease) ([facility])   . Dyspnea    on exertion  . Hyperlipidemia   . Hypertension   . Pneumothorax, spontaneous, tension 1960  . SOB (shortness of breath) on exertion    Past Surgical History:  Procedure Laterality Date  . EYE SURGERY     bilateral cataract with lens implants  . LEFT HEART CATH AND CORONARY ANGIOGRAPHY N/A 11/28/2019    Procedure: LEFT HEART CATH AND CORONARY ANGIOGRAPHY;  Surgeon: Nigel Mormon, MD;  Location: [facility];  Service: Cardiovascular;  Laterality: N/A;  . LUNG SURGERY     40 years ago  . TOE SURGERY    . TOTAL KNEE ARTHROPLASTY Left 10/06/2017   Procedure: LEFT TOTAL KNEE ARTHROPLASTY;  Surgeon: Susa Day, MD;  Location: [facility];  Service: Orthopedics;  Laterality: Left;  Adductor Block    FAMILY HISTORY History reviewed. No pertinent family history.  SOCIAL HISTORY Social History   Tobacco Use  . Smoking status: Former Smoker    Packs/day: 1.00    Years: 5.00    Pack years: 5.00    Types: Cigarettes    Quit date: 11/24/1958    Years since quitting: 61.4  . Smokeless tobacco: Never Used  Vaping Use  . Vaping Use: Never used  Substance Use Topics  . Alcohol use: No  . Drug use: No         OPHTHALMIC EXAM:  Base Eye Exam    Visual Acuity (ETDRS)      Right Left   Dist [place] 20/30 -2 20/40 +2   Dist ph [place] NI 20/20 -2       Tonometry (Tonopen, 10:16 AM)      Right Left   Pressure 15 15       Pupils      Pupils Dark Light Shape React APD   Right PERRL 3 3 Round Minimal None   Left PERRL 3 3 Round Minimal None       Visual Fields (Counting fingers)      Left Right    Full Full       Extraocular Movement      Right Left    Full Full       Neuro/Psych    Oriented x3: Yes   Mood/Affect: Normal       Dilation    Right eye: 1.0% Mydriacyl, 2.5% Phenylephrine @ 10:19 AM        Slit Lamp and Fundus Exam    External Exam      Right Left   External Normal Normal       Slit Lamp Exam      Right Left   Lids/Lashes Normal Normal   Conjunctiva/Sclera White and quiet White and quiet   Cornea Clear Clear   Anterior Chamber Deep and quiet Deep and quiet   Iris Round and reactive Round and reactive   Lens Posterior chamber intraocular lens Posterior chamber intraocular lens   Anterior Vitreous Normal Normal       Fundus Exam      Right  Left   Posterior Vitreous Vitrectomized, clear    Disc Normal    C/D Ratio 0.5  Macula Less topo distortion, Soft drusen, Early age related macular degeneration    Vessels Normal    Periphery Normal, retinopexy present superotemporal, no new retinal tears           IMAGING AND PROCEDURES  Imaging and Procedures for 04/04/20  OCT, Retina - OU - Both Eyes       Right Eye Quality was good. Scan locations included subfoveal. Central Foveal Thickness: 358. Progression has improved.   Left Eye Quality was good. Central Foveal Thickness: 260. Progression has improved.   Notes Much improved anatomy OD post vit                ASSESSMENT/PLAN:  Macular pucker, right eye Status post vitrectomy membrane peel, 11/23/2019, visual acuity improving as well as anatomic findings by clinical examination and OCT  Early stage nonexudative age-related macular degeneration of both eyes No progression or changes      ICD-10-CM   1. Macular pucker, right eye  H35.371 OCT, Retina - OU - Both Eyes  2. Early stage nonexudative age-related macular degeneration of both eyes  H35.3131     1.  OU, will monitor for ARMD over the coming years  2.  3.  Ophthalmic Meds Ordered this visit:  No orders of the defined types were placed in this encounter.      Return in about 1 year (around 04/04/2021) for DILATE OU, OCT.  There are no Patient Instructions on file for this visit.   Explained the diagnoses, plan, and follow up with the patient and they expressed understanding.  Patient expressed understanding of the importance of proper follow up care.   Clent Demark Nazareth Norenberg M.D. Diseases & Surgery of the Retina and Vitreous [facility] 04/04/20     Abbreviations: M myopia (nearsighted); A astigmatism; H hyperopia (farsighted); P presbyopia; Mrx spectacle prescription;  CTL contact lenses; OD right eye; OS left eye; OU both eyes  XT exotropia; ET esotropia; PEK punctate  epithelial keratitis; PEE punctate epithelial erosions; DES dry eye syndrome; MGD meibomian gland dysfunction; ATs artificial tears; PFAT's preservative free artificial tears; [place] nuclear sclerotic cataract; PSC posterior subcapsular cataract; ERM epi-retinal membrane; PVD posterior vitreous detachment; RD retinal detachment; DM diabetes mellitus; DR diabetic retinopathy; NPDR non-proliferative diabetic retinopathy; PDR proliferative diabetic retinopathy; CSME clinically significant macular edema; DME diabetic macular edema; dbh dot blot hemorrhages; CWS cotton wool spot; POAG primary open angle glaucoma; C/D cup-to-disc ratio; HVF humphrey visual field; GVF goldmann visual field; OCT optical coherence tomography; IOP intraocular pressure; BRVO Branch retinal vein occlusion; CRVO central retinal vein occlusion; CRAO central retinal artery occlusion; BRAO branch retinal artery occlusion; RT retinal tear; SB scleral buckle; PPV pars plana vitrectomy; VH Vitreous hemorrhage; PRP panretinal laser photocoagulation; IVK intravitreal kenalog; VMT vitreomacular traction; MH Macular hole;  NVD neovascularization of the disc; NVE neovascularization elsewhere; AREDS age related eye disease study; ARMD age related macular degeneration; POAG primary open angle glaucoma; EBMD epithelial/anterior basement membrane dystrophy; ACIOL anterior chamber intraocular lens; IOL intraocular lens; PCIOL posterior chamber intraocular lens; Phaco/IOL phacoemulsification with intraocular lens placement; [place] photorefractive keratectomy; LASIK laser assisted in situ keratomileusis; HTN hypertension; DM diabetes mellitus; COPD chronic obstructive pulmonary disease

## 2020-04-04 NOTE — Assessment & Plan Note (Signed)
Status post vitrectomy membrane peel, 11/23/2019, visual acuity improving as well as anatomic findings by clinical examination and OCT

## 2020-04-04 NOTE — Assessment & Plan Note (Signed)
No progression or changes

## 2020-04-12 ENCOUNTER — Other Ambulatory Visit: Payer: Self-pay | Admitting: Cardiology

## 2020-04-16 ENCOUNTER — Ambulatory Visit: Payer: PPO | Admitting: Emergency Medicine

## 2020-04-16 ENCOUNTER — Other Ambulatory Visit: Payer: Self-pay

## 2020-04-16 ENCOUNTER — Encounter: Payer: Self-pay | Admitting: Emergency Medicine

## 2020-04-16 DIAGNOSIS — J432 Centrilobular emphysema: Secondary | ICD-10-CM

## 2020-04-16 MED ORDER — STIOLTO RESPIMAT 2.5-2.5 MCG/ACT IN AERS
2.0000 | INHALATION_SPRAY | Freq: Every day | RESPIRATORY_TRACT | 0 refills | Status: DC
Start: 2020-04-16 — End: 2020-04-30

## 2020-04-16 NOTE — Assessment & Plan Note (Signed)
Progressive overall dyspnea over the last year.  He transitioned from Anoro to Trelegy recently per his PCP.  Difficult to really tell if the Trelegy is superior because he has not been taking it every day, more like every other day.  It sounds like he may not of been taking the Anoro reliably either.  He does not have flaring symptoms, does not have exacerbations and he does not have purulent bronchitis so I do not think he needs the inhaled corticosteroid.  We could change him back to LABA/LAMA.  If he did not feel that he got a lot of benefit from the Anoro we can try Stiolto as an alternative-he may get better deposition of the mist formulation as opposed to the powder of Anoro.  We will try samples.  If he prefers the Stiolto then we will order it through his pharmacy.  If he wants to go back to the Anoro then we will continue this.  Emphasized the importance of taking his maintenance medication every day.

## 2020-04-16 NOTE — Progress Notes (Signed)
Subjective:    Patient ID: Joseph [name], male    DOB: 1934/12/22, 85 y.o.   MRN: [id number]  HPI  ROV 03/22/2019 --this follow-up visit for patient with a minimal tobacco history, mild COPD with obstruction noted on spirometry.  He is currently managed on Anoro, does feel that he benefits from it. He does have some exertional fatigue, suspect dyspnea, with heavier chores, yard work. Recovers w rest after just few minutes. Has albuterol, rarely uses. PNA shot and flu shot UTD. No flares, overall well.   ROV 04/16/20 --85 year old gentleman with history of mild COPD who had been managed on Anoro.  He returns today for annual follow-up.  Today he reports that he was changed to Trelegy a couple weeks ago, but that he is only taking every other day. He continues to have exertional SOB and limitation. He has to stop to rest after working in the yard. Sometimes he tolerates better - is able to walk over a mile. No cough. No wheeze. Has not pred or abx.    Review of Systems  Constitutional: Negative for fever and unexpected weight change.  HENT: Negative for congestion, dental problem, ear pain, nosebleeds, postnasal drip, rhinorrhea, sinus pressure, sneezing, sore throat and trouble swallowing.   Eyes: Negative for redness and itching.  Respiratory: Positive for shortness of breath. Negative for cough, chest tightness and wheezing.   Cardiovascular: Negative for palpitations and leg swelling.  Gastrointestinal: Negative for nausea and vomiting.  Genitourinary: Negative for dysuria.  Musculoskeletal: Negative for joint swelling.  Skin: Negative for rash.  Neurological: Negative for headaches.  Hematological: Does not bruise/bleed easily.  Psychiatric/Behavioral: Negative for dysphoric mood. The patient is not nervous/anxious.         Objective:   Physical Exam  Vitals:   04/16/20 1511  BP: 134/72  Pulse: 88  Temp: 97.8 F (36.6 C)  TempSrc: Temporal  SpO2: 98%  Weight: 184 lb 9.6  oz (83.7 kg)  Height: 6' (1.829 m)   Gen: Thin man, in no distress,  normal affect  ENT: No lesions,  mouth clear,  oropharynx clear, no postnasal drip  Neck: No JVD, no stridor  Lungs: No use of accessory muscles, no wheeze or crackles  Cardiovascular: RRR, heart sounds normal, no murmur or gallops, no peripheral edema  Musculoskeletal: No deformities, L knee larger than R, non-tender  Neuro: alert, non focal  Skin: Warm, no lesions or rash      Assessment & Plan:  COPD (chronic obstructive pulmonary disease) ([facility]) Progressive overall dyspnea over the last year.  He transitioned from Anoro to Trelegy recently per his PCP.  Difficult to really tell if the Trelegy is superior because he has not been taking it every day, more like every other day.  It sounds like he may not of been taking the Anoro reliably either.  He does not have flaring symptoms, does not have exacerbations and he does not have purulent bronchitis so I do not think he needs the inhaled corticosteroid.  We could change him back to LABA/LAMA.  If he did not feel that he got a lot of benefit from the Anoro we can try Stiolto as an alternative-he may get better deposition of the mist formulation as opposed to the powder of Anoro.  We will try samples.  If he prefers the Stiolto then we will order it through his pharmacy.  If he wants to go back to the Anoro then we will continue this.  Emphasized the  importance of taking his maintenance medication every day.  Baltazar Apo, MD, PhD 04/16/2020, 5:10 PM [facility] [phone] or if no answer [phone]

## 2020-04-16 NOTE — Patient Instructions (Addendum)
Stop Trelegy We will do a trial of Stiolto 2 puffs once daily to see if this medication helps you, if you prefer it.  If so and if it is well covered by your insurance then we will prescribe for you.  Please call us and let us know. Alternatively if we do not decide to continue the Stiolto, then change back to Anoro 1 inhalation once daily.  Take this every day reliably on a schedule. Keep albuterol available to use 2 puffs up to every 4 hours if needed for shortness of breath, chest tightness, wheezing.  Vaccines are up-to-date Get the Moderna COVID-19 booster when available Follow with Dr. Lamonte Sakai in 12 months or sooner if you have any problems.

## 2020-04-26 DIAGNOSIS — L570 Actinic keratosis: Secondary | ICD-10-CM | POA: Diagnosis not present

## 2020-04-26 DIAGNOSIS — X32XXXD Exposure to sunlight, subsequent encounter: Secondary | ICD-10-CM | POA: Diagnosis not present

## 2020-04-30 ENCOUNTER — Telehealth: Payer: Self-pay | Admitting: Emergency Medicine

## 2020-04-30 MED ORDER — STIOLTO RESPIMAT 2.5-2.5 MCG/ACT IN AERS: 2.0000 | INHALATION_SPRAY | Freq: Every day | RESPIRATORY_TRACT | 11 refills | Status: DC

## 2020-04-30 NOTE — Telephone Encounter (Signed)
Spoke with the pt  He feels that the Stiolto is helping  Rx was sent to pharm  He will call back if any issues obtaining med

## 2020-05-02 ENCOUNTER — Telehealth: Payer: Self-pay | Admitting: Emergency Medicine

## 2020-05-02 NOTE — Telephone Encounter (Signed)
Spoke with pt. He states that we should be getting paperwork from his insurance company about [employer] today. Advised him that we would keep a look out for the fax.

## 2020-05-02 NOTE — Telephone Encounter (Signed)
Medication name and strength: Stiolto Provider: Lamonte Sakai Pharmacy: [facility] Patient insurance ID: [id number]   Was the PA started on CMM?  yes  Donovan Kail (Key: [id number])

## 2020-05-10 NOTE — Telephone Encounter (Signed)
Checked CMM and the PA was still being processed. Called [employer] at [phone] and spoke to a rep and was advised the PA for Stiolto was approved from 05/02/20-07/05/21. PA#: [id number].   lmtcb for pt.

## 2020-05-15 DIAGNOSIS — B078 Other viral warts: Secondary | ICD-10-CM | POA: Diagnosis not present

## 2020-05-15 DIAGNOSIS — L570 Actinic keratosis: Secondary | ICD-10-CM | POA: Diagnosis not present

## 2020-05-15 DIAGNOSIS — L820 Inflamed seborrheic keratosis: Secondary | ICD-10-CM | POA: Diagnosis not present

## 2020-05-15 DIAGNOSIS — X32XXXD Exposure to sunlight, subsequent encounter: Secondary | ICD-10-CM | POA: Diagnosis not present

## 2020-05-15 NOTE — Telephone Encounter (Signed)
Lm x2 for patient.  Will close encounter per office protocol.   

## 2020-05-16 ENCOUNTER — Telehealth: Payer: Self-pay | Admitting: Emergency Medicine

## 2020-05-16 NOTE — Telephone Encounter (Signed)
Called and spoke with pt letting him know that the PA for stiolto was approved and he should be able to get Rx from pharmacy. Pt verbalized understanding. Nothing further needed.

## 2020-05-19 ENCOUNTER — Other Ambulatory Visit: Payer: Self-pay | Admitting: Cardiology

## 2020-05-19 DIAGNOSIS — I10 Essential (primary) hypertension: Secondary | ICD-10-CM

## 2020-05-24 NOTE — Telephone Encounter (Signed)
error 

## 2020-06-05 DIAGNOSIS — X32XXXD Exposure to sunlight, subsequent encounter: Secondary | ICD-10-CM | POA: Diagnosis not present

## 2020-06-05 DIAGNOSIS — L570 Actinic keratosis: Secondary | ICD-10-CM | POA: Diagnosis not present

## 2020-06-05 DIAGNOSIS — A630 Anogenital (venereal) warts: Secondary | ICD-10-CM | POA: Diagnosis not present

## 2020-06-21 DIAGNOSIS — X32XXXD Exposure to sunlight, subsequent encounter: Secondary | ICD-10-CM | POA: Diagnosis not present

## 2020-06-21 DIAGNOSIS — L570 Actinic keratosis: Secondary | ICD-10-CM | POA: Diagnosis not present

## 2020-06-21 DIAGNOSIS — A630 Anogenital (venereal) warts: Secondary | ICD-10-CM | POA: Diagnosis not present

## 2020-07-15 ENCOUNTER — Telehealth: Payer: Self-pay | Admitting: Emergency Medicine

## 2020-07-15 NOTE — Telephone Encounter (Signed)
Spoke to patient, who is questioning if he can take pedialyte to increase energy level.  He states that he has been experiencing decrease energy and increased fatigue and weakness x59mo He has not discussed sx with PCP. Patient is okay with waiting until 07/16/2020 for response.   Dr. Lamonte Sakai, please advise.

## 2020-07-17 NOTE — Telephone Encounter (Signed)
I suppose it is ok for him to use pedialyte. It should help prevent dehydration if he uses 1-2x a day. I'm not sure how it will impact his energy level. He will have to see how he feels. Would not recommend using more than 2x a day.

## 2020-07-17 NOTE — Telephone Encounter (Signed)
Spoke with the pt and notified of recs per RB  He verbalized understanding and nothing further needed 

## 2020-07-24 ENCOUNTER — Telehealth: Payer: Self-pay | Admitting: Emergency Medicine

## 2020-07-24 NOTE — Telephone Encounter (Signed)
Set up ov with Dr. Lamonte Sakai  08/06/20 at 1545 to discuss COPD, dyspnea and inhalers.

## 2020-07-24 NOTE — Telephone Encounter (Signed)
Patient is returning Joseph Edison NP phone call. Patient phone number is [phone].

## 2020-07-24 NOTE — Telephone Encounter (Signed)
Tried to call , patient having phone difficulties

## 2020-07-25 ENCOUNTER — Encounter (INDEPENDENT_AMBULATORY_CARE_PROVIDER_SITE_OTHER): Payer: Self-pay | Admitting: Ophthalmology

## 2020-07-25 ENCOUNTER — Ambulatory Visit (INDEPENDENT_AMBULATORY_CARE_PROVIDER_SITE_OTHER): Payer: PPO | Admitting: Ophthalmology

## 2020-07-25 ENCOUNTER — Other Ambulatory Visit: Payer: Self-pay

## 2020-07-25 ENCOUNTER — Encounter (INDEPENDENT_AMBULATORY_CARE_PROVIDER_SITE_OTHER): Payer: PPO | Admitting: Ophthalmology

## 2020-07-25 DIAGNOSIS — H35371 Puckering of macula, right eye: Secondary | ICD-10-CM | POA: Diagnosis not present

## 2020-07-25 DIAGNOSIS — H33321 Round hole, right eye: Secondary | ICD-10-CM

## 2020-07-25 DIAGNOSIS — H43812 Vitreous degeneration, left eye: Secondary | ICD-10-CM

## 2020-07-25 NOTE — Assessment & Plan Note (Signed)
Acuity improvement continues baseline at 2080 now 20/20 minus allowing for visual perception of wavy lines. Patient to understands now that this is related to healing process and the acuity is now good enough and improved enough to be able to detect a wavy line.  I assured the patient he does not require further treatment at this time. Hopefully visual adaptation will occur through the CNS.

## 2020-07-25 NOTE — Assessment & Plan Note (Signed)

## 2020-07-25 NOTE — Progress Notes (Signed)
07/25/2020     CHIEF COMPLAINT Patient presents for Blurred Vision (WIP wavy lines OD//Pt sts he notices wavy lines OD when closing OS only x 3 months approx. Pt sts wavy lines have gradually worsened over time. Pt denies any other new symptoms OU. VA stable OS.)   HISTORY OF PRESENT ILLNESS: Joseph [name] is a 86 y.o. male who presents to the clinic today for:   HPI    Blurred Vision    In right eye.  Onset was gradual.  Vision is distorted.  Severity is moderate.  This started 3 months ago.  Occurring intermittently.  It is worse throughout the day.  Since onset it is stable.  Treatments tried include no treatments.  Response to treatment was no improvement. Additional comments: WIP wavy lines OD  Pt sts he notices wavy lines OD when closing OS only x 3 months approx. Pt sts wavy lines have gradually worsened over time. Pt denies any other new symptoms OU. VA stable OS.       Last edited by Ileana Roup, COA on 07/25/2020  9:00 AM. (History)      Referring physician: Merri Brunette, MD [address] [place],  [place] [address]  HISTORICAL INFORMATION:   Selected notes from the MEDICAL RECORD NUMBER       CURRENT MEDICATIONS: No current outpatient medications on file. (Ophthalmic Drugs)   No current facility-administered medications for this visit. (Ophthalmic Drugs)   Current Outpatient Medications (Other)  Medication Sig  . albuterol (PROAIR HFA) 108 (90 Base) MCG/ACT inhaler Inhale 2 puffs into the lungs every 6 (six) hours as needed for wheezing or shortness of breath.  (Patient not taking: Reported on 04/16/2020)  . amLODipine (NORVASC) 10 MG tablet TAKE 1 TABLET BY MOUTH EVERY EVENING.  Marland Kitchen aspirin 81 MG chewable tablet Chew 81 mg by mouth daily.  . Dutasteride-Tamsulosin HCl (JALYN) 0.5-0.4 MG CAPS Take 1 capsule by mouth daily.   . Fluticasone-Umeclidin-Vilant (TRELEGY ELLIPTA) 100-62.5-25 MCG/INH AEPB Inhale into the lungs every other day.   .  levothyroxine (SYNTHROID) 137 MCG tablet Take 137 mcg by mouth daily before breakfast. Was told on 11/06/19 by PCP not to take  . lisinopril (ZESTRIL) 10 MG tablet TAKE 1 TABLET(10 MG) BY MOUTH IN THE MORNING AND AT BEDTIME  . lovastatin (MEVACOR) 40 MG tablet Take 40 mg by mouth daily.   . magnesium oxide (MAG-OX) 400 MG tablet Take 400 mg by mouth daily.  . meloxicam (MOBIC) 7.5 MG tablet Take 7.5 mg by mouth daily as needed for pain.   . nitroGLYCERIN (NITROSTAT) 0.4 MG SL tablet Place 1 tablet (0.4 mg total) under the tongue every 5 (five) minutes as needed for chest pain.  Marland Kitchen omeprazole (PRILOSEC) 40 MG capsule Take 40 mg by mouth daily before breakfast.  . Tiotropium Bromide-Olodaterol (STIOLTO RESPIMAT) 2.5-2.5 MCG/ACT AERS Inhale 2 puffs into the lungs daily.  Marland Kitchen umeclidinium-vilanterol (ANORO ELLIPTA) 62.5-25 MCG/INH AEPB Inhale 1 puff into the lungs daily.  (Patient not taking: Reported on 04/16/2020)   No current facility-administered medications for this visit. (Other)      REVIEW OF SYSTEMS:    ALLERGIES Allergies  Allergen Reactions  . Ciprofloxacin Anaphylaxis and Other (See Comments)    Caused sores & blisters and a Joseph to the hospital- was also taking Aldara at the same time, however  . Ciprocin-Fluocin-Procin [Fluocinolone Acetonide] Other (See Comments)    Blisters and sores  . Fluocinolone Acetonide Other (See Comments)  Reaction not recalled  . Quinolones Other (See Comments)    Pt had to go to hospital (sores and blisters)  . Statins Other (See Comments)    Made the legs hurt  . Imiquimod Other (See Comments)    (Aldara) Caused sores that landed him in the hospital for 3 days- was taking Cipro at the same time, however  . Penicillins Itching, Rash and Other (See Comments)    Has patient had a PCN reaction causing immediate rash, facial/tongue/throat swelling, SOB or lightheadedness with hypotension: Yes Has patient had a PCN reaction causing severe rash  involving mucus membranes or skin necrosis: No Has patient had a PCN reaction that required hospitalization: No Has patient had a PCN reaction occurring within the last 10 years: No If all of the above answers are "NO", then may proceed with Cephalosporin use.     PAST MEDICAL HISTORY Past Medical History:  Diagnosis Date  . Arthritis   . COPD (chronic obstructive pulmonary disease) (HCC)   . Dyspnea    on exertion  . Hyperlipidemia   . Hypertension   . Pneumothorax, spontaneous, tension 1960  . SOB (shortness of breath) on exertion    Past Surgical History:  Procedure Laterality Date  . EYE SURGERY     bilateral cataract with lens implants  . LEFT HEART CATH AND CORONARY ANGIOGRAPHY N/A 11/28/2019   Procedure: LEFT HEART CATH AND CORONARY ANGIOGRAPHY;  Surgeon: Elder Negus, MD;  Location: [facility];  Service: Cardiovascular;  Laterality: N/A;  . LUNG SURGERY     40 years ago  . TOE SURGERY    . TOTAL KNEE ARTHROPLASTY Left 10/06/2017   Procedure: LEFT TOTAL KNEE ARTHROPLASTY;  Surgeon: Jene Every, MD;  Location: [facility];  Service: Orthopedics;  Laterality: Left;  Adductor Block    FAMILY HISTORY History reviewed. No pertinent family history.  SOCIAL HISTORY Social History   Tobacco Use  . Smoking status: Former Smoker    Packs/day: 1.00    Years: 5.00    Pack years: 5.00    Types: Cigarettes    Quit date: 11/24/1958    Years since quitting: 61.7  . Smokeless tobacco: Never Used  Vaping Use  . Vaping Use: Never used  Substance Use Topics  . Alcohol use: No  . Drug use: No         OPHTHALMIC EXAM:  Base Eye Exam    Visual Acuity (ETDRS)      Right Left   Dist [place] 20/25 +1 20/20 -1       Tonometry (Tonopen, 9:00 AM)      Right Left   Pressure 10 10       Pupils      Pupils Dark Light Shape React APD   Right PERRL 3 3 Round Minimal None   Left PERRL 3 3 Round Minimal None       Visual Fields (Counting fingers)      Left Right     Full Full       Extraocular Movement      Right Left    Full Full       Neuro/Psych    Oriented x3: Yes   Mood/Affect: Normal       Dilation    Right eye: 1.0% Mydriacyl, 2.5% Phenylephrine @ 9:04 AM        Slit Lamp and Fundus Exam    External Exam      Right Left   External Normal  Normal       Slit Lamp Exam      Right Left   Lids/Lashes Normal Normal   Conjunctiva/Sclera White and quiet White and quiet   Cornea Clear Clear   Anterior Chamber Deep and quiet Deep and quiet   Iris Round and reactive Round and reactive   Lens  Centered posterior chamber intraocular lens, 1+ Posterior capsular opacification inf to vis axis Posterior chamber intraocular lens   Anterior Vitreous Normal Normal       Fundus Exam      Right Left   Posterior Vitreous Vitrectomized, clear    Disc Normal    C/D Ratio 0.55    Macula Less topo distortion, Soft drusen, 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 Scan locations included subfoveal. Central Foveal Thickness: 352. Progression has improved. Findings include abnormal foveal contour.   Left Eye Quality was good. Scan locations included subfoveal. Central Foveal Thickness: 270. Progression has been stable. Findings include normal foveal contour, no SRF, retinal drusen , no IRF.   Notes No active disease OD  Vastly improved macular anatomy status post membrane peel removal of ILM July 2021. No shaded with acuity improvement OD                ASSESSMENT/PLAN:  Macular pucker, right eye Acuity improvement continues baseline at 2080 now 20/20 minus allowing for visual perception of wavy lines. Patient to understands now that this is related to healing process and the acuity is now good  enough and improved enough to be able to detect a wavy line.  I assured the patient he does not require further treatment at this time. Hopefully visual adaptation will occur through the CNS.  Posterior vitreous detachment of left eye   The nature of posterior vitreous detachment was discussed with the patient as well as its physiology, its age prevalence, and its possible implication regarding retinal breaks and detachment.  An informational brochure was given to the patient.  All the patient's questions were answered.  The patient was asked to return if new or different flashes or floaters develops.   Patient was instructed to contact office immediately if any changes were noticed. I explained to the patient that vitreous inside the eye is similar to jello inside a bowl. As the jello melts it can start to pull away from the bowl, similarly the vitreous throughout our lives can begin to pull away from the retina. That process is called a posterior vitreous detachment. In some cases, the vitreous can tug hard enough on the retina to form a retinal tear. I discussed with the patient the signs and symptoms of a retinal detachment.  Do not rub the eye.      ICD-10-CM   1. Macular pucker, right eye  H35.371 OCT, Retina - OU - Both Eyes  2. Posterior vitreous detachment of left eye  H43.812   3. Round hole of right eye  H33.321     1. No active new disease. Visual acuity in the right eye continues with slow improvement over the last 5 months post vitrectomy membrane peel  2.  3.  Ophthalmic Meds Ordered this visit:  No orders of  the defined types were placed in this encounter.      Return in about 1 year (around 07/25/2021) for DILATE OU, COLOR FP, OCT.  There are no Patient Instructions on file for this visit.   Explained the diagnoses, plan, and follow up with the patient and they expressed understanding.  Patient expressed understanding of the importance of proper follow up care.    Clent Demark Joselle Deeds M.D. Diseases & Surgery of the Retina and Vitreous [facility] [place] 07/25/20     Abbreviations: M myopia (nearsighted); A astigmatism; H hyperopia (farsighted); P presbyopia; Mrx spectacle prescription;  CTL contact lenses; OD right eye; OS left eye; OU both eyes  XT exotropia; ET esotropia; PEK punctate epithelial keratitis; PEE punctate epithelial erosions; DES dry eye syndrome; MGD meibomian gland dysfunction; ATs artificial tears; PFAT's preservative free artificial tears; [place] nuclear sclerotic cataract; PSC posterior subcapsular cataract; ERM epi-retinal membrane; PVD posterior vitreous detachment; RD retinal detachment; DM diabetes mellitus; DR diabetic retinopathy; NPDR non-proliferative diabetic retinopathy; PDR proliferative diabetic retinopathy; CSME clinically significant macular edema; DME diabetic macular edema; dbh dot blot hemorrhages; CWS cotton wool spot; POAG primary open angle glaucoma; C/D cup-to-disc ratio; HVF humphrey visual field; GVF goldmann visual field; OCT optical coherence tomography; IOP intraocular pressure; BRVO Branch retinal vein occlusion; CRVO central retinal vein occlusion; CRAO central retinal artery occlusion; BRAO branch retinal artery occlusion; RT retinal tear; SB scleral buckle; PPV pars plana vitrectomy; VH Vitreous hemorrhage; PRP panretinal laser photocoagulation; IVK intravitreal kenalog; VMT vitreomacular traction; MH Macular hole;  NVD neovascularization of the disc; NVE neovascularization elsewhere; AREDS age related eye disease study; ARMD age related macular degeneration; POAG primary open angle glaucoma; EBMD epithelial/anterior basement membrane dystrophy; ACIOL anterior chamber intraocular lens; IOL intraocular lens; PCIOL posterior chamber intraocular lens; Phaco/IOL phacoemulsification with intraocular lens placement; [place] photorefractive keratectomy; LASIK laser assisted in situ keratomileusis; HTN hypertension; DM  diabetes mellitus; COPD chronic obstructive pulmonary disease

## 2020-08-03 DIAGNOSIS — E039 Hypothyroidism, unspecified: Secondary | ICD-10-CM | POA: Diagnosis not present

## 2020-08-03 DIAGNOSIS — I2510 Atherosclerotic heart disease of native coronary artery without angina pectoris: Secondary | ICD-10-CM | POA: Diagnosis not present

## 2020-08-03 DIAGNOSIS — E7800 Pure hypercholesterolemia, unspecified: Secondary | ICD-10-CM | POA: Diagnosis not present

## 2020-08-03 DIAGNOSIS — I10 Essential (primary) hypertension: Secondary | ICD-10-CM | POA: Diagnosis not present

## 2020-08-06 ENCOUNTER — Other Ambulatory Visit: Payer: Self-pay

## 2020-08-06 ENCOUNTER — Encounter: Payer: Self-pay | Admitting: Emergency Medicine

## 2020-08-06 ENCOUNTER — Ambulatory Visit: Payer: PPO | Admitting: Emergency Medicine

## 2020-08-06 DIAGNOSIS — J432 Centrilobular emphysema: Secondary | ICD-10-CM | POA: Diagnosis not present

## 2020-08-06 NOTE — Progress Notes (Signed)
Subjective:    Patient ID: Joseph [name], male    DOB: 09/14/1934, 86 y.o.   MRN: [id number]  HPI  ROV 03/22/2019 --this follow-up visit for patient with a minimal tobacco history, mild COPD with obstruction noted on spirometry.  He is currently managed on Anoro, does feel that he benefits from it. He does have some exertional fatigue, suspect dyspnea, with heavier chores, yard work. Recovers w rest after just few minutes. Has albuterol, rarely uses. PNA shot and flu shot UTD. No flares, overall well.   ROV 04/16/20 --86 year old gentleman with history of mild COPD who had been managed on Anoro.  He returns today for annual follow-up.  Today he reports that he was changed to Trelegy a couple weeks ago, but that he is only taking every other day. He continues to have exertional SOB and limitation. He has to stop to rest after working in the yard. Sometimes he tolerates better - is able to walk over a mile. No cough. No wheeze. Has not pred or abx.   ROV 08/06/20 --86 year old man with mild COPD, normal FEV1 with evidence for obstruction on pulmonary function testing from 2019.  At his last visit in October he reported that he had been changed to Trelegy.  I then change him to [facility] as I did not believe he needed the ICS component.  Today he reports that he has felt a bit more dyspneic - over the last few months he has been more tired, weak. Having trouble even moving through the house. He had a L heart cath in 11/2019 >> clean coronaries. Gets his thyroid checked by Dr Shelia Media, has been ok.  MDM: Reviewed cardiac catheterization from 11/2019, lab work from 11/2019 Reviewed cardiology notes from 03/15/2020    CBC    Component Value Date/Time   WBC 9.1 11/28/2019 1249   RBC 4.89 11/28/2019 1249   HGB 13.9 11/28/2019 1249   HCT 42.3 11/28/2019 1249   PLT 193 11/28/2019 1249   MCV 86.5 11/28/2019 1249   MCH 28.4 11/28/2019 1249   MCHC 32.9 11/28/2019 1249   RDW 15.1 11/28/2019 1249   LYMPHSABS  1.7 08/17/2008 1555   MONOABS 0.7 08/17/2008 1555   EOSABS 0.0 08/17/2008 1555   BASOSABS 0.0 08/17/2008 1555     Review of Systems  Constitutional: Negative for fever and unexpected weight change.  HENT: Negative for congestion, dental problem, ear pain, nosebleeds, postnasal drip, rhinorrhea, sinus pressure, sneezing, sore throat and trouble swallowing.   Eyes: Negative for redness and itching.  Respiratory: Positive for shortness of breath. Negative for cough, chest tightness and wheezing.   Cardiovascular: Negative for palpitations and leg swelling.  Gastrointestinal: Negative for nausea and vomiting.  Genitourinary: Negative for dysuria.  Musculoskeletal: Negative for joint swelling.  Skin: Negative for rash.  Neurological: Negative for headaches.  Hematological: Does not bruise/bleed easily.  Psychiatric/Behavioral: Negative for dysphoric mood. The patient is not nervous/anxious.         Objective:   Physical Exam  Vitals:   08/06/20 1539  BP: 116/72  Pulse: 86  Temp: (!) 97.3 F (36.3 C)  SpO2: 99%  Weight: 185 lb (83.9 kg)  Height: 6' (1.829 m)   Gen: Thin man, in no distress,  normal affect  ENT: No lesions,  mouth clear,  oropharynx clear, no postnasal drip  Neck: No JVD, mild UA noise on exp  Lungs: No use of accessory muscles, no wheeze or crackles  Cardiovascular: RRR, heart sounds normal, no  murmur or gallops, no peripheral edema  Musculoskeletal: No deformities, L knee larger than R, non-tender  Neuro: alert, non focal  Skin: Warm, no lesions or rash      Assessment & Plan:  COPD (chronic obstructive pulmonary disease) (HCC) Significant dyspnea, decreased functional capacity that seems to be out of proportion related to his pulmonary function testing from 2019.  His cardiac catheterization in May was clear.  Unclear cause.  We will check a CBC, TSH, repeat his pulmonary function testing.  He wants to change back from [employer] to [employer].  The  prescription was written by Dr. Shelia Media.  We will change your Stiolto back to Anoro. Use one inhalation once a day.  Keep albuterol available to use 2 puffs if you need it for shortness of breath.  We will repeat your pulmonary function testing at your next office visit Blood work today.  Depending on how your testing looks we may decide to perform a cardiopulmonary exercise test at some point in the future. Follow with Dr. Lamonte Sakai next available with full pulmonary function testing on the same day.   Baltazar Apo, MD, PhD 08/06/2020, 3:55 PM [facility] [phone] or if no answer [phone]

## 2020-08-06 NOTE — Patient Instructions (Addendum)
We will change your Stiolto back to Anoro. Use one inhalation once a day.  Keep albuterol available to use 2 puffs if you need it for shortness of breath.  We will repeat your pulmonary function testing at your next office visit Blood work today.  Depending on how your testing looks we may decide to perform a cardiopulmonary exercise test at some point in the future. Follow with Dr. Lamonte Sakai next available with full pulmonary function testing on the same day.

## 2020-08-06 NOTE — Assessment & Plan Note (Signed)
Significant dyspnea, decreased functional capacity that seems to be out of proportion related to his pulmonary function testing from 2019.  His cardiac catheterization in May was clear.  Unclear cause.  We will check a CBC, TSH, repeat his pulmonary function testing.  He wants to change back from [employer] to [employer].  The prescription was written by Dr. Shelia Media.  We will change your Stiolto back to Anoro. Use one inhalation once a day.  Keep albuterol available to use 2 puffs if you need it for shortness of breath.  We will repeat your pulmonary function testing at your next office visit Blood work today.  Depending on how your testing looks we may decide to perform a cardiopulmonary exercise test at some point in the future. Follow with Dr. Lamonte Sakai next available with full pulmonary function testing on the same day.

## 2020-08-07 ENCOUNTER — Other Ambulatory Visit: Payer: Self-pay | Admitting: Cardiology

## 2020-08-07 DIAGNOSIS — I10 Essential (primary) hypertension: Secondary | ICD-10-CM

## 2020-08-07 LAB — CBC WITH DIFFERENTIAL/PLATELET
Basophils Absolute: 0 10*3/uL (ref 0.0–0.1)
Basophils Relative: 0.5 % (ref 0.0–3.0)
Eosinophils Absolute: 0.2 10*3/uL (ref 0.0–0.7)
Eosinophils Relative: 1.6 % (ref 0.0–5.0)
HCT: 47.8 % (ref 39.0–52.0)
Hemoglobin: 15.8 g/dL (ref 13.0–17.0)
Lymphocytes Relative: 36.8 % (ref 12.0–46.0)
Lymphs Abs: 3.4 10*3/uL (ref 0.7–4.0)
MCHC: 33 g/dL (ref 30.0–36.0)
MCV: 88.2 fl (ref 78.0–100.0)
Monocytes Absolute: 0.8 10*3/uL (ref 0.1–1.0)
Monocytes Relative: 8.5 % (ref 3.0–12.0)
Neutro Abs: 4.9 10*3/uL (ref 1.4–7.7)
Neutrophils Relative %: 52.6 % (ref 43.0–77.0)
Platelets: 164 10*3/uL (ref 150.0–400.0)
RBC: 5.42 Mil/uL (ref 4.22–5.81)
RDW: 16 % — ABNORMAL HIGH (ref 11.5–15.5)
WBC: 9.3 10*3/uL (ref 4.0–10.5)

## 2020-08-07 LAB — TSH: TSH: 0.19 u[IU]/mL — ABNORMAL LOW (ref 0.35–4.50)

## 2020-09-01 DIAGNOSIS — Z20822 Contact with and (suspected) exposure to covid-19: Secondary | ICD-10-CM | POA: Diagnosis not present

## 2020-09-02 ENCOUNTER — Other Ambulatory Visit (HOSPITAL_COMMUNITY): Payer: PPO

## 2020-09-02 DIAGNOSIS — E7800 Pure hypercholesterolemia, unspecified: Secondary | ICD-10-CM | POA: Diagnosis not present

## 2020-09-02 DIAGNOSIS — E039 Hypothyroidism, unspecified: Secondary | ICD-10-CM | POA: Diagnosis not present

## 2020-09-02 DIAGNOSIS — I10 Essential (primary) hypertension: Secondary | ICD-10-CM | POA: Diagnosis not present

## 2020-09-02 DIAGNOSIS — I2510 Atherosclerotic heart disease of native coronary artery without angina pectoris: Secondary | ICD-10-CM | POA: Diagnosis not present

## 2020-09-04 ENCOUNTER — Ambulatory Visit: Payer: PPO | Admitting: Emergency Medicine

## 2020-09-04 ENCOUNTER — Encounter: Payer: Self-pay | Admitting: Emergency Medicine

## 2020-09-04 ENCOUNTER — Ambulatory Visit (INDEPENDENT_AMBULATORY_CARE_PROVIDER_SITE_OTHER): Payer: PPO

## 2020-09-04 ENCOUNTER — Other Ambulatory Visit: Payer: Self-pay

## 2020-09-04 ENCOUNTER — Ambulatory Visit (INDEPENDENT_AMBULATORY_CARE_PROVIDER_SITE_OTHER): Payer: PPO | Admitting: Emergency Medicine

## 2020-09-04 VITALS — BP 118/68 | HR 83 | Temp 97.5°F | Ht 72.0 in | Wt 182.0 lb

## 2020-09-04 DIAGNOSIS — J432 Centrilobular emphysema: Secondary | ICD-10-CM

## 2020-09-04 DIAGNOSIS — R0602 Shortness of breath: Secondary | ICD-10-CM

## 2020-09-04 LAB — PULMONARY FUNCTION TEST
DL/VA % pred: 75 %
DL/VA: 2.86 ml/min/mmHg/L
DLCO cor % pred: 71 %
DLCO cor: 18.27 ml/min/mmHg
DLCO unc % pred: 74 %
DLCO unc: 18.86 ml/min/mmHg
FEF 25-75 Post: 2.3 L/sec
FEF 25-75 Pre: 2.23 L/sec
FEF2575-%Change-Post: 2 %
FEF2575-%Pred-Post: 113 %
FEF2575-%Pred-Pre: 110 %
FEV1-%Change-Post: 0 %
FEV1-%Pred-Post: 103 %
FEV1-%Pred-Pre: 102 %
FEV1-Post: 3.12 L
FEV1-Pre: 3.09 L
FEV1FVC-%Change-Post: 0 %
FEV1FVC-%Pred-Pre: 102 %
FEV6-%Change-Post: 0 %
FEV6-%Pred-Post: 107 %
FEV6-%Pred-Pre: 106 %
FEV6-Post: 4.24 L
FEV6-Pre: 4.23 L
FEV6FVC-%Pred-Post: 107 %
FEV6FVC-%Pred-Pre: 107 %
FVC-%Change-Post: 0 %
FVC-%Pred-Post: 100 %
FVC-%Pred-Pre: 99 %
FVC-Post: 4.24 L
FVC-Pre: 4.23 L
Post FEV1/FVC ratio: 73 %
Post FEV6/FVC ratio: 100 %
Pre FEV1/FVC ratio: 73 %
Pre FEV6/FVC Ratio: 100 %

## 2020-09-04 NOTE — Progress Notes (Signed)
Subjective:    Patient ID: Joseph [name], male    DOB: Dec 12, 1934, 86 y.o.   MRN: [id number]  HPI  ROV 08/06/20 --86 year old man with mild COPD, normal FEV1 with evidence for obstruction on pulmonary function testing from 2019.  At his last visit in October he reported that he had been changed to Trelegy.  I then changed him to [facility] as I did not believe he needed the ICS component.  Today he reports that he has felt a bit more dyspneic - over the last few months he has been more tired, weak. Having trouble even moving through the house. He had a L heart cath in 11/2019 >> clean coronaries. Gets his thyroid checked by Dr Shelia Media, has been ok.  ROV 09/04/20 --follow-up visit for 86 year old gentleman with mild COPD, mild obstruction based on prior pulmonary function testing.  He has had some increasing exertional dyspnea which prompted [place] to change his bronchodilators from Anoro to Trelegy and most recently 2 Stiolto.  I changed him back to Anoro a month ago because he seemed to get the best benefit from this. He feels about the same since the change. His exertional tolerance is about the same. He has dizziness going from sitting to standing. No cough or wheeze.   We repeated his pulmonary function testing today because he was still having dyspnea.  I have reviewed.  This shows normal airflows without a bronchodilator response.  FEV1 3.09 L, 102% predicted.  Lung volumes were performed by gas volume dilution and were consistent with restriction.  Diffusion capacity was slightly decreased and did not fully correct to the normal range when adjusted for alveolar volume.   CXR 09/08/19 reviewed and was normal Labs 08/06/2020  >> TSH 0.19, hemoglobin 15.8   CBC    Component Value Date/Time   WBC 9.3 08/06/2020 1602   RBC 5.42 08/06/2020 1602   HGB 15.8 08/06/2020 1602   HCT 47.8 08/06/2020 1602   PLT 164.0 08/06/2020 1602   MCV 88.2 08/06/2020 1602   MCH 28.4 11/28/2019 1249   MCHC 33.0 08/06/2020  1602   RDW 16.0 (H) 08/06/2020 1602   LYMPHSABS 3.4 08/06/2020 1602   MONOABS 0.8 08/06/2020 1602   EOSABS 0.2 08/06/2020 1602   BASOSABS 0.0 08/06/2020 1602     Review of Systems  Constitutional: Negative for fever and unexpected weight change.  HENT: Negative for congestion, dental problem, ear pain, nosebleeds, postnasal drip, rhinorrhea, sinus pressure, sneezing, sore throat and trouble swallowing.   Eyes: Negative for redness and itching.  Respiratory: Positive for shortness of breath. Negative for cough, chest tightness and wheezing.   Cardiovascular: Negative for palpitations and leg swelling.  Gastrointestinal: Negative for nausea and vomiting.  Genitourinary: Negative for dysuria.  Musculoskeletal: Negative for joint swelling.  Skin: Negative for rash.  Neurological: Negative for headaches.  Hematological: Does not bruise/bleed easily.  Psychiatric/Behavioral: Negative for dysphoric mood. The patient is not nervous/anxious.        Objective:   Physical Exam  Vitals:   09/04/20 1600  BP: 118/68  Pulse: 83  Temp: (!) 97.5 F (36.4 C)  TempSrc: Temporal  SpO2: 96%  Weight: 182 lb (82.6 kg)  Height: 6' (1.829 m)   Gen: Thin man, in no distress,  normal affect  ENT: No lesions,  mouth clear,  oropharynx clear, no postnasal drip  Neck: No JVD, mild UA noise on exp  Lungs: No use of accessory muscles, no wheeze or crackles  Cardiovascular: RRR,  heart sounds normal, no murmur or gallops, no peripheral edema  Musculoskeletal: No deformities, L knee larger than R, non-tender  Neuro: alert, non focal  Skin: Warm, no lesions or rash      Assessment & Plan:  COPD (chronic obstructive pulmonary disease) (HCC) Minimal obstruction, in fact his current spirometry is normal.  Not surprised that he is not getting a lot of benefit from bronchodilator therapy.  He does think that Anoro helps him and helps him better than the alternatives we have tried.  He can continue  this.  Need to rule out occult desaturation and we will do this today.  Check a chest x-ray to ensure no evolving interstitial disease given restriction noted on his lung volumes.  If present then we will perform CT chest.  If he continues to have exertional dyspnea then would consider a cardiopulmonary exercise test  Baltazar Apo, MD, PhD 09/04/2020, 4:31 PM [facility] [phone] or if no answer [phone]

## 2020-09-04 NOTE — Progress Notes (Signed)
Full PFT performed today.Patient declined pleth; N2 performed. 

## 2020-09-04 NOTE — Assessment & Plan Note (Signed)
Minimal obstruction, in fact his current spirometry is normal.  Not surprised that he is not getting a lot of benefit from bronchodilator therapy.  He does think that Anoro helps him and helps him better than the alternatives we have tried.  He can continue this.  Need to rule out occult desaturation and we will do this today.  Check a chest x-ray to ensure no evolving interstitial disease given restriction noted on his lung volumes.  If present then we will perform CT chest.  If he continues to have exertional dyspnea then would consider a cardiopulmonary exercise test

## 2020-09-04 NOTE — Patient Instructions (Signed)
Your pulmonary function testing is stable compared with your priors. Continue Anoro 1 inhalation once daily as you have been taking it. Chest x-ray today Walking oximetry on room air today Depending on how your breathing progresses we may decide to perform a cardiopulmonary exercise test Follow with Dr Lamonte Sakai in 3 months or sooner if you have any problems.

## 2020-09-04 NOTE — Addendum Note (Signed)
Addended by: Dierdre Highman on: 09/04/2020 04:38 PM   Modules accepted: Orders

## 2020-09-05 DIAGNOSIS — R7303 Prediabetes: Secondary | ICD-10-CM | POA: Diagnosis not present

## 2020-09-05 DIAGNOSIS — I10 Essential (primary) hypertension: Secondary | ICD-10-CM | POA: Diagnosis not present

## 2020-09-05 DIAGNOSIS — R748 Abnormal levels of other serum enzymes: Secondary | ICD-10-CM | POA: Diagnosis not present

## 2020-09-05 DIAGNOSIS — J449 Chronic obstructive pulmonary disease, unspecified: Secondary | ICD-10-CM | POA: Diagnosis not present

## 2020-09-05 DIAGNOSIS — E039 Hypothyroidism, unspecified: Secondary | ICD-10-CM | POA: Diagnosis not present

## 2020-09-12 ENCOUNTER — Ambulatory Visit: Payer: PPO | Admitting: Cardiology

## 2020-10-03 DIAGNOSIS — I2510 Atherosclerotic heart disease of native coronary artery without angina pectoris: Secondary | ICD-10-CM | POA: Diagnosis not present

## 2020-10-03 DIAGNOSIS — E039 Hypothyroidism, unspecified: Secondary | ICD-10-CM | POA: Diagnosis not present

## 2020-10-03 DIAGNOSIS — I10 Essential (primary) hypertension: Secondary | ICD-10-CM | POA: Diagnosis not present

## 2020-10-03 DIAGNOSIS — E7800 Pure hypercholesterolemia, unspecified: Secondary | ICD-10-CM | POA: Diagnosis not present

## 2020-10-14 ENCOUNTER — Ambulatory Visit: Payer: PPO | Admitting: Cardiology

## 2020-10-14 ENCOUNTER — Other Ambulatory Visit: Payer: Self-pay

## 2020-10-14 ENCOUNTER — Encounter: Payer: Self-pay | Admitting: Cardiology

## 2020-10-14 VITALS — BP 109/63 | HR 88 | Temp 98.0°F | Resp 16 | Ht 72.0 in | Wt 185.2 lb

## 2020-10-14 DIAGNOSIS — Z87891 Personal history of nicotine dependence: Secondary | ICD-10-CM

## 2020-10-14 DIAGNOSIS — R0600 Dyspnea, unspecified: Secondary | ICD-10-CM

## 2020-10-14 DIAGNOSIS — R0609 Other forms of dyspnea: Secondary | ICD-10-CM

## 2020-10-14 DIAGNOSIS — E782 Mixed hyperlipidemia: Secondary | ICD-10-CM

## 2020-10-14 DIAGNOSIS — N183 Chronic kidney disease, stage 3 unspecified: Secondary | ICD-10-CM | POA: Diagnosis not present

## 2020-10-14 DIAGNOSIS — I10 Essential (primary) hypertension: Secondary | ICD-10-CM

## 2020-10-14 DIAGNOSIS — I129 Hypertensive chronic kidney disease with stage 1 through stage 4 chronic kidney disease, or unspecified chronic kidney disease: Secondary | ICD-10-CM

## 2020-10-14 MED ORDER — LISINOPRIL 10 MG PO TABS: 10.0000 mg | ORAL_TABLET | Freq: Every morning | ORAL | 0 refills | Status: DC

## 2020-10-14 NOTE — Progress Notes (Signed)
Joseph [name] Date of Birth: Jul 14, 1934 MRN: [id number] Primary Care Provider:Pharr, Thayer Jew, MD  Date: 10/14/20 Last Office Visit: 03/15/2020  Chief Complaint  Patient presents with  . Dyspnea on exertion  . Follow-up    6 months     HPI  Joseph [name] is a 86 y.o.  male who presents to the office with a chief complaint of "50-monthfollow-up for shortness of breath evaluation" Patient's past medical history and cardiovascular risk factors include: Hypertension, hyperlipidemia, advanced age,former smoker, asthma.  Patient has had a extensive cardiovascular evaluation given his underlying dyspnea on exertion.  He is undergone echocardiogram stress test as well as left heart catheterization.  It was deemed that his shortness of breath was most likely noncardiac in origin and therefore has also establish care with pulmonology since last office visit.  He states that he underwent multiple pulmonary testing and has not heard back.  He has follow-up appointment scheduled.  Since last office visit he has not required the use of sublingual nitroglycerin tablet.  His blood pressure is relatively stable.  And he is no longer taking lisinopril twice a day.  FUNCTIONAL STATUS: walks 1.5 miles on a daily basis.   ALLERGIES: Allergies  Allergen Reactions  . Ciprofloxacin Anaphylaxis and Other (See Comments)    Caused sores & blisters and a trip to the hospital- was also taking Aldara at the same time, however  . Ciprocin-Fluocin-Procin [Fluocinolone Acetonide] Other (See Comments)    Blisters and sores  . Fluocinolone Acetonide Other (See Comments)    Reaction not recalled  . Quinolones Other (See Comments)    Pt had to go to hospital (sores and blisters)  . Statins Other (See Comments)    Made the legs hurt  . Imiquimod Other (See Comments)    (Aldara) Caused sores that landed him in the hospital for 3 days- was taking Cipro at the same time, however  . Penicillins Itching, Rash  and Other (See Comments)    Has patient had a PCN reaction causing immediate rash, facial/tongue/throat swelling, SOB or lightheadedness with hypotension: Yes Has patient had a PCN reaction causing severe rash involving mucus membranes or skin necrosis: No Has patient had a PCN reaction that required hospitalization: No Has patient had a PCN reaction occurring within the last 10 years: No If all of the above answers are "NO", then may proceed with Cephalosporin use.      MEDICATION LIST PRIOR TO VISIT: Current Outpatient Medications on File Prior to Visit  Medication Sig Dispense Refill  . albuterol (VENTOLIN HFA) 108 (90 Base) MCG/ACT inhaler Inhale 2 puffs into the lungs every 6 (six) hours as needed for wheezing or shortness of breath.    .Marland KitchenamLODipine (NORVASC) 10 MG tablet TAKE 1 TABLET BY MOUTH EVERY EVENING. 90 tablet 3  . aspirin 81 MG chewable tablet Chew 81 mg by mouth daily.    .Marland Kitchenaugmented betamethasone dipropionate (DIPROLENE-AF) 0.05 % cream Apply topically.    . Dutasteride-Tamsulosin HCl 0.5-0.4 MG CAPS Take 1 capsule by mouth daily.     .Marland Kitchenlevothyroxine (SYNTHROID) 137 MCG tablet Take 137 mcg by mouth daily before breakfast. Was told on 11/06/19 by PCP not to take    . lovastatin (MEVACOR) 40 MG tablet Take 40 mg by mouth daily.     . magnesium oxide (MAG-OX) 400 MG tablet Take 400 mg by mouth daily.    . meloxicam (MOBIC) 7.5 MG tablet Take 7.5 mg by mouth daily as needed  for pain.     . nitroGLYCERIN (NITROSTAT) 0.4 MG SL tablet Place 1 tablet (0.4 mg total) under the tongue every 5 (five) minutes as needed for chest pain. 30 tablet 3  . omeprazole (PRILOSEC) 40 MG capsule Take 20 mg by mouth daily before breakfast.    . umeclidinium-vilanterol (ANORO ELLIPTA) 62.5-25 MCG/INH AEPB Inhale 1 puff into the lungs daily.     No current facility-administered medications on file prior to visit.    PAST MEDICAL HISTORY: Past Medical History:  Diagnosis Date  . Arthritis   .  COPD (chronic obstructive pulmonary disease) ([place])   . Dyspnea    on exertion  . Hyperlipidemia   . Hypertension   . Pneumothorax, spontaneous, tension 1960  . SOB (shortness of breath) on exertion     PAST SURGICAL HISTORY: Past Surgical History:  Procedure Laterality Date  . EYE SURGERY     bilateral cataract with lens implants  . LEFT HEART CATH AND CORONARY ANGIOGRAPHY N/A 11/28/2019   Procedure: LEFT HEART CATH AND CORONARY ANGIOGRAPHY;  Surgeon: Nigel Mormon, MD;  Location: [facility];  Service: Cardiovascular;  Laterality: N/A;  . LUNG SURGERY     40 years ago  . TOE SURGERY    . TOTAL KNEE ARTHROPLASTY Left 10/06/2017   Procedure: LEFT TOTAL KNEE ARTHROPLASTY;  Surgeon: Susa Day, MD;  Location: [facility];  Service: Orthopedics;  Laterality: Left;  Adductor Block    FAMILY HISTORY: The patient's family history is not on file.   SOCIAL HISTORY:  The patient  reports that he quit smoking about 61 years ago. His smoking use included cigarettes. He has a 5.00 pack-year smoking history. He has never used smokeless tobacco. He reports that he does not drink alcohol and does not use drugs.  Review of Systems  Constitutional: Negative for chills, fever and malaise/fatigue.  HENT: Negative for ear discharge, ear pain and nosebleeds.   Eyes: Negative for blurred vision and discharge.  Cardiovascular: Negative for chest pain, claudication, dyspnea on exertion, leg swelling, near-syncope, orthopnea, palpitations, paroxysmal nocturnal dyspnea and syncope.  Respiratory: Positive for shortness of breath (chronic and stable). Negative for cough and snoring.   Endocrine: Negative for polydipsia, polyphagia and polyuria.  Hematologic/Lymphatic: Negative for bleeding problem.  Skin: Negative for flushing and nail changes.  Musculoskeletal: Negative for muscle cramps, muscle weakness and myalgias.  Gastrointestinal: Negative for abdominal pain, dysphagia, hematemesis,  hematochezia, melena, nausea and vomiting.  Neurological: Negative for dizziness, focal weakness and light-headedness.    PHYSICAL EXAM: Vitals with BMI 10/14/2020 09/04/2020 08/06/2020  Height '6\' 0"'  '6\' 0"'  '6\' 0"'   Weight 185 lbs 3 oz 182 lbs 185 lbs  BMI 25.11 56.31 49.70  Systolic 263 785 885  Diastolic 63 68 72  Pulse 88 83 86   CONSTITUTIONAL: Well-developed and well-nourished. No acute distress.  SKIN: Skin is warm and dry. No rash noted. No cyanosis. No pallor. No jaundice HEAD: Normocephalic and atraumatic.  EYES: No scleral icterus MOUTH/THROAT: Moist oral membranes.  NECK: No JVD present. No thyromegaly noted. No carotid bruits  LYMPHATIC: No visible cervical adenopathy.  CHEST Normal respiratory effort. No intercostal retractions  LUNGS: Expiratory wheezes noted in the right posterior chest wall.  Equal rise and fall in chest cavity.  No rales or rhonchi's. CARDIOVASCULAR: Regular rate and rhythm, positive S1-S2, no murmurs rubs or gallops appreciated. ABDOMINAL: Soft, nontender, nondistended, positive bowel sounds in all 4 quadrants.  No apparent ascites.  EXTREMITIES: No peripheral edema,  compression stockings present. HEMATOLOGIC: No significant bruising NEUROLOGIC: Oriented to person, place, and time. Nonfocal. Normal muscle tone.  PSYCHIATRIC: Normal mood and affect. Normal behavior. Cooperative  EKG: 10/14/2020: Normal sinus rhythm, 72 bpm, without underlying ischemia or injury pattern.  Echocardiogram: 09/2019: LVEF 55-60%, mild LVH, normal diastolic filling pressures, normal left atrial pressures, mildly dilated left atrium, mild MR, mild TR. Large liver cyst (6.9x7.8cm) noted. Consider dedicated imaging, if clinically indicated.  Stress Testing:  Lexiscan 09/11/2019: Nondiagnostic ECG stress. The baseline blood pressure was 80/54 mmHg and decreased to 78/54 mmHg at peak infusion, which is a physiologic response to Intravenous Lexiscan. Patient asymptomatic. Resting EKG  demonstrated normal sinus rhythm. Non-specific ST-T abnormality. Peak EKG revealed no significant ST-T change from baseline abnormality. Myocardial perfusion is normal. Stress LV EF: 52%. No previous exam available for comparison. Low risk study.   Heart Catheterization: 11/28/2019 by Dr. Joya Gaskins Patwardhan: Normal epicardial coronary arteries.  Normal LVEDP.  Carotid Duplex: 03/70/4888: Peak systolic velocities in the right bifurcation, internal, external and common carotid arteries are within normal limits. Minimal stenosis in the left internal carotid artery (minimal) with homogenous plaque.  Right vertebral artery flow is not visualized. Antegrade left vertebral artery flow.  14 - day [facility] Tele[facility]. Enrollment Period: 10/18/2019-10/31/2019 Predominant rhythm normal sinus with heart rate ranging from 45 - 121 bpm, average heart rate 69 bpm. No pauses greater than or equal to 2.5 seconds. No atrial fibrillation detected during the monitoring period.   Total supraventricular ectopic burden <1% (323 supraventricular ectopy, 16 supraventricular runs). Longest run was 22 beats in duration at 162 bpm. The fastest run was 3 beats in duration at 171 bpm. Total ventricular ectopic burden <1% (6925 ventricular ectopy, 0 ventricular pairs and 0 ventricular runs). Heart rate < 60 bpm for 27.8% of the recording. Heart rate > 100 bpm for 3.4% of the recording. No Auto triggered events.   21 Patient triggered events reviewed, not associated with any significant arrhythmia (baseline artifact in many tracings).  Recent labs: 09/08/2019: Glucose 110, BUN/Cr 130.93 EGFR >60. Na/K 140/4.1.  H/H 15.6/48.8.   LABORATORY DATA: CBC Latest Ref Rng & Units 08/06/2020 11/28/2019 09/08/2019  WBC 4.0 - 10.5 K/uL 9.3 9.1 7.0  Hemoglobin 13.0 - 17.0 g/dL 15.8 13.9 15.6  Hematocrit 39.0 - 52.0 % 47.8 42.3 48.8  Platelets 150.0 - 400.0 K/uL 164.0 193 178    CMP Latest Ref Rng & Units 11/24/2019  10/05/2019 09/25/2019  Glucose 65 - 99 mg/dL 105(H) 115(H) 121(H)  BUN 8 - 27 mg/dL '16 18 17  ' Creatinine 0.76 - 1.27 mg/dL 0.96 1.06 1.16  Sodium 134 - 144 mmol/L 141 140 141  Potassium 3.5 - 5.2 mmol/L 4.6 4.4 3.6  Chloride 96 - 106 mmol/L 104 100 100  CO2 20 - 29 mmol/L 18(L) 22 24  Calcium 8.6 - 10.2 mg/dL 9.9 9.9 9.7  Total Protein 6.1 - 8.1 g/dL - - -  Total Bilirubin 0.2 - 1.2 mg/dL - - -  Alkaline Phos 39 - 117 U/L - - -  AST 10 - 35 U/L - - -  ALT 9 - 46 U/L - - -    Lipid Panel     Component Value Date/Time   CHOL 139 09/25/2019 0813   TRIG 122 09/25/2019 0813   HDL 45 09/25/2019 0813   LDLCALC 72 09/25/2019 0813   LABVLDL 22 09/25/2019 0813   FINAL MEDICATION LIST END OF ENCOUNTER: Meds ordered this encounter  Medications  .  lisinopril (ZESTRIL) 10 MG tablet    Sig: Take 1 tablet (10 mg total) by mouth every morning.    Dispense:  90 tablet    Refill:  0    ZERO refills remain on this prescription. Your patient is requesting advance approval of refills for this medication to [facility]    Medications Discontinued During This Encounter  Medication Reason  . Fluticasone-Umeclidin-Vilant (TRELEGY ELLIPTA) 100-62.5-25 MCG/INH AEPB Error  . Tiotropium Bromide-Olodaterol (STIOLTO RESPIMAT) 2.5-2.5 MCG/ACT AERS Error  . lisinopril (ZESTRIL) 10 MG tablet      Current Outpatient Medications:  .  albuterol (VENTOLIN HFA) 108 (90 Base) MCG/ACT inhaler, Inhale 2 puffs into the lungs every 6 (six) hours as needed for wheezing or shortness of breath., Disp: , Rfl:  .  amLODipine (NORVASC) 10 MG tablet, TAKE 1 TABLET BY MOUTH EVERY EVENING., Disp: 90 tablet, Rfl: 3 .  aspirin 81 MG chewable tablet, Chew 81 mg by mouth daily., Disp: , Rfl:  .  augmented betamethasone dipropionate (DIPROLENE-AF) 0.05 % cream, Apply topically., Disp: , Rfl:  .  Dutasteride-Tamsulosin HCl 0.5-0.4 MG CAPS, Take 1 capsule by mouth daily. , Disp: , Rfl:  .  levothyroxine (SYNTHROID) 137  MCG tablet, Take 137 mcg by mouth daily before breakfast. Was told on 11/06/19 by PCP not to take, Disp: , Rfl:  .  lovastatin (MEVACOR) 40 MG tablet, Take 40 mg by mouth daily. , Disp: , Rfl:  .  magnesium oxide (MAG-OX) 400 MG tablet, Take 400 mg by mouth daily., Disp: , Rfl:  .  meloxicam (MOBIC) 7.5 MG tablet, Take 7.5 mg by mouth daily as needed for pain. , Disp: , Rfl:  .  nitroGLYCERIN (NITROSTAT) 0.4 MG SL tablet, Place 1 tablet (0.4 mg total) under the tongue every 5 (five) minutes as needed for chest pain., Disp: 30 tablet, Rfl: 3 .  omeprazole (PRILOSEC) 40 MG capsule, Take 20 mg by mouth daily before breakfast., Disp: , Rfl:  .  umeclidinium-vilanterol (ANORO ELLIPTA) 62.5-25 MCG/INH AEPB, Inhale 1 puff into the lungs daily., Disp: , Rfl:  .  lisinopril (ZESTRIL) 10 MG tablet, Take 1 tablet (10 mg total) by mouth every morning., Disp: 90 tablet, Rfl: 0  IMPRESSION:    ICD-10-CM   1. Dyspnea on exertion  R06.00 EKG 12-Lead  2. Essential hypertension, benign  I10 lisinopril (ZESTRIL) 10 MG tablet  3. Benign hypertension with CKD (chronic kidney disease) stage III (HCC)  I12.9    N18.30   4. Mixed hyperlipidemia  E78.2   5. Former smoker  Z87.891      RECOMMENDATIONS: Joseph [name] is a 86 y.o. male whose past medical history and cardiovascular risk factors include: Hypertension, hyperlipidemia, advanced age,former smoker.  Dyspnea on exertion:  From a cardiovascular standpoint patient has undergone an echo, stress test, and invasive angiography results are stated above for further reference.  Has established care with pulmonary medicine since last office visit.  Underwent additional pulmonary testing and awaiting results.  He continues to have expiratory wheezes predominant on the right compared to the left side.  I suspect if he has underlying asthma that still needs to be aggressively treated.  Will defer to pulmonary medicine.  In the past we discussed rechecking a  monitor to evaluate for PVC burden.  At this time patient would like to hold off on it.    We will continue to monitor the symptoms.  Medications reconciled.  Benign essential hypertension . Office blood pressure  is at goal.  . Medication reconciled.  . Patient states that he feels better taking lisinopril 10 mg p.o. daily as opposed to twice daily.  Will update MAR  . Low salt diet recommended. A diet that is rich in fruits, vegetables, legumes, and low-fat dairy products and low in snacks, sweets, and meats (such as the Dietary Approaches to Stop Hypertension [DASH] diet).   Mixed hyperlipidemia: Continue statin therapy.  Currently managed by primary care provider.  Former smoker: Educated on the importance of continued smoking cessation.   Orders Placed This Encounter  Procedures  . EKG 12-Lead   --Continue cardiac medications as reconciled in final medication list. --Return in about 6 months (around 04/15/2021) for Follow up , Dyspnea. Or sooner if needed. --Continue follow-up with your primary care physician regarding the management of your other chronic comorbid conditions.  Patient's questions and concerns were addressed to his satisfaction. He voices understanding of the instructions provided during this encounter.   This note was created using a voice recognition software as a result there may be grammatical errors inadvertently enclosed that do not reflect the nature of this encounter. Every attempt is made to correct such errors.  Total time spent: 30 minutes.  Rex Kras, [place], [facility]  Pager: [phone] Office: [phone]

## 2020-11-02 DIAGNOSIS — I2510 Atherosclerotic heart disease of native coronary artery without angina pectoris: Secondary | ICD-10-CM | POA: Diagnosis not present

## 2020-11-02 DIAGNOSIS — E039 Hypothyroidism, unspecified: Secondary | ICD-10-CM | POA: Diagnosis not present

## 2020-11-02 DIAGNOSIS — E7800 Pure hypercholesterolemia, unspecified: Secondary | ICD-10-CM | POA: Diagnosis not present

## 2020-11-02 DIAGNOSIS — I10 Essential (primary) hypertension: Secondary | ICD-10-CM | POA: Diagnosis not present

## 2020-11-04 ENCOUNTER — Encounter (HOSPITAL_COMMUNITY): Payer: Self-pay | Admitting: Pharmacy Technician

## 2020-11-04 ENCOUNTER — Emergency Department (HOSPITAL_COMMUNITY): Payer: PPO

## 2020-11-04 ENCOUNTER — Other Ambulatory Visit: Payer: Self-pay

## 2020-11-04 ENCOUNTER — Emergency Department (HOSPITAL_COMMUNITY)
Admission: EM | Admit: 2020-11-04 | Discharge: 2020-11-04 | Disposition: A | Discharge status: Home / Self Care | Payer: PPO | Attending: Emergency Medicine | Admitting: Emergency Medicine

## 2020-11-04 DIAGNOSIS — Z79899 Other long term (current) drug therapy: Secondary | ICD-10-CM | POA: Insufficient documentation

## 2020-11-04 DIAGNOSIS — G319 Degenerative disease of nervous system, unspecified: Secondary | ICD-10-CM | POA: Diagnosis not present

## 2020-11-04 DIAGNOSIS — S42211A Unspecified displaced fracture of surgical neck of right humerus, initial encounter for closed fracture: Secondary | ICD-10-CM

## 2020-11-04 DIAGNOSIS — W01198A Fall on same level from slipping, tripping and stumbling with subsequent striking against other object, initial encounter: Secondary | ICD-10-CM | POA: Insufficient documentation

## 2020-11-04 DIAGNOSIS — Z87891 Personal history of nicotine dependence: Secondary | ICD-10-CM | POA: Diagnosis not present

## 2020-11-04 DIAGNOSIS — Y9301 Activity, walking, marching and hiking: Secondary | ICD-10-CM | POA: Diagnosis not present

## 2020-11-04 DIAGNOSIS — J449 Chronic obstructive pulmonary disease, unspecified: Secondary | ICD-10-CM | POA: Insufficient documentation

## 2020-11-04 DIAGNOSIS — S4991XA Unspecified injury of right shoulder and upper arm, initial encounter: Secondary | ICD-10-CM | POA: Diagnosis present

## 2020-11-04 DIAGNOSIS — E039 Hypothyroidism, unspecified: Secondary | ICD-10-CM | POA: Insufficient documentation

## 2020-11-04 DIAGNOSIS — Z96652 Presence of left artificial knee joint: Secondary | ICD-10-CM | POA: Insufficient documentation

## 2020-11-04 DIAGNOSIS — S0990XA Unspecified injury of head, initial encounter: Secondary | ICD-10-CM | POA: Diagnosis not present

## 2020-11-04 DIAGNOSIS — Z7982 Long term (current) use of aspirin: Secondary | ICD-10-CM | POA: Diagnosis not present

## 2020-11-04 DIAGNOSIS — I10 Essential (primary) hypertension: Secondary | ICD-10-CM | POA: Insufficient documentation

## 2020-11-04 DIAGNOSIS — Y92009 Unspecified place in unspecified non-institutional (private) residence as the place of occurrence of the external cause: Secondary | ICD-10-CM | POA: Insufficient documentation

## 2020-11-04 DIAGNOSIS — J3489 Other specified disorders of nose and nasal sinuses: Secondary | ICD-10-CM | POA: Diagnosis not present

## 2020-11-04 MED ORDER — ACETAMINOPHEN 500 MG PO TABS
1000.0000 mg | ORAL_TABLET | Freq: Once | ORAL | Status: AC
  Administered 2020-11-04: 1000 mg via ORAL
  Filled 2020-11-04: qty 2

## 2020-11-04 NOTE — Discharge Instructions (Signed)
Wear your shoulder immobilizer at all times. You can alternate ibuprofen and Tylenol as needed for pain. Ice can also help with pain and swelling. Follow-up with Dr. Lucia Gaskins within 1 week, this is the bone doctor. Come back to the emergency department if you experience numbness, tingling, severe swelling, or any other medical emergency.

## 2020-11-04 NOTE — ED Triage Notes (Signed)
Pt here POV with reports of mechanical fall and hitting his R shoulder on the couch. Pt states pain with movement. CNS intact.

## 2020-11-04 NOTE — ED Provider Notes (Signed)
[facility] Provider Note   CSN: [id number] Arrival date & time: 11/04/20  1259     History Chief Complaint  Patient presents with  . Shoulder Injury    OTHER Joseph [name] is a 86 y.o. male.  HPI 86 year old male with history of hypertension, COPD, and hyperlipidemia presents the emergency department for right upper arm pain.  This has been present since 9 AM this morning.  Intermittent with waxing and waning intensity.  Rest completely resolves the pain, movement makes it becomes severe.  Denies history of pain like this previously.  States it started suddenly after he tripped and fell over a rug in his house.  Denies hitting his head or losing consciousness.  States he landed on his right shoulder.  Has not taken anything for the pain.  Denies blood thinner use.  Denies other symptoms at this time.    Past Medical History:  Diagnosis Date  . Arthritis   . COPD (chronic obstructive pulmonary disease) ([place])   . Dyspnea    on exertion  . Hyperlipidemia   . Hypertension   . Pneumothorax, spontaneous, tension 1960  . SOB (shortness of breath) on exertion     Patient Active Problem List   Diagnosis Date Noted  . Posterior vitreous detachment of left eye 01/11/2020  . Retinal hemorrhage of left eye 01/11/2020  . Pseudophakia of both eyes 01/11/2020  . Round hole of right eye 01/11/2020  . Macular pucker, right eye 01/11/2020  . Early stage nonexudative age-related macular degeneration of both eyes 01/11/2020  . Angina pectoris ([place])   . Former smoker   . Infection of prosthetic left knee joint ([place]) 02/17/2018  . Stiffness of left knee 10/26/2017  . Benign hypertension 10/13/2017  . Hypothyroidism due to acquired atrophy of thyroid 10/13/2017  . GERD without esophagitis 10/13/2017  . BPH with obstruction/lower urinary tract symptoms 10/13/2017  . Chronic constipation 10/13/2017  . S/P total knee arthroplasty, left 10/12/2017  . Left  knee DJD 10/06/2017  . Lung nodule 01/09/2016  . COPD (chronic obstructive pulmonary disease) ([place]) 01/09/2016  . ED (erectile dysfunction) of organic origin 02/15/2012  . Elevated prostate specific antigen (PSA) 02/15/2012  . Hyperlipidemia   . Pneumothorax, spontaneous, tension   . SOB (shortness of breath) on exertion     Past Surgical History:  Procedure Laterality Date  . EYE SURGERY     bilateral cataract with lens implants  . LEFT HEART CATH AND CORONARY ANGIOGRAPHY N/A 11/28/2019   Procedure: LEFT HEART CATH AND CORONARY ANGIOGRAPHY;  Surgeon: Nigel Mormon, MD;  Location: [facility];  Service: Cardiovascular;  Laterality: N/A;  . LUNG SURGERY     40 years ago  . TOE SURGERY    . TOTAL KNEE ARTHROPLASTY Left 10/06/2017   Procedure: LEFT TOTAL KNEE ARTHROPLASTY;  Surgeon: Susa Day, MD;  Location: [facility];  Service: Orthopedics;  Laterality: Left;  Adductor Block       No family history on file.  Social History   Tobacco Use  . Smoking status: Former Smoker    Packs/day: 1.00    Years: 5.00    Pack years: 5.00    Types: Cigarettes    Quit date: 11/24/1958    Years since quitting: 61.9  . Smokeless tobacco: Never Used  Vaping Use  . Vaping Use: Never used  Substance Use Topics  . Alcohol use: No  . Drug use: No    Home Medications Prior to  Admission medications   Medication Sig Start Date End Date Taking? Authorizing Provider  albuterol (VENTOLIN HFA) 108 (90 Base) MCG/ACT inhaler Inhale 2 puffs into the lungs every 6 (six) hours as needed for wheezing or shortness of breath.    [provider]  amLODipine (NORVASC) 10 MG tablet TAKE 1 TABLET BY MOUTH EVERY EVENING. 04/12/20   Tolia, Sunit, DO  aspirin 81 MG chewable tablet Chew 81 mg by mouth daily.    [provider]  augmented betamethasone dipropionate (DIPROLENE-AF) 0.05 % cream Apply topically. 09/11/20   [provider]  Dutasteride-Tamsulosin HCl 0.5-0.4 MG CAPS  Take 1 capsule by mouth daily.     [provider]  levothyroxine (SYNTHROID) 137 MCG tablet Take 137 mcg by mouth daily before breakfast. Was told on 11/06/19 by PCP not to take    [provider]  lisinopril (ZESTRIL) 10 MG tablet Take 1 tablet (10 mg total) by mouth every morning. 10/14/20 01/12/21  Tolia, Sunit, DO  lovastatin (MEVACOR) 40 MG tablet Take 40 mg by mouth daily.  10/31/17   [provider]  magnesium oxide (MAG-OX) 400 MG tablet Take 400 mg by mouth daily.    [provider]  meloxicam (MOBIC) 7.5 MG tablet Take 7.5 mg by mouth daily as needed for pain.  08/10/19   [provider]  nitroGLYCERIN (NITROSTAT) 0.4 MG SL tablet Place 1 tablet (0.4 mg total) under the tongue every 5 (five) minutes as needed for chest pain. 09/08/19 12/07/19  Patwardhan, Reynold Bowen, MD  omeprazole (PRILOSEC) 40 MG capsule Take 20 mg by mouth daily before breakfast.    [provider]  umeclidinium-vilanterol (ANORO ELLIPTA) 62.5-25 MCG/INH AEPB Inhale 1 puff into the lungs daily.    [provider]    Allergies    Ciprofloxacin, Ciprocin-fluocin-procin [fluocinolone acetonide], Fluocinolone acetonide, Quinolones, Statins, Imiquimod, and Penicillins  Review of Systems   Review of Systems  Constitutional: Negative for chills and fever.  HENT: Negative for ear pain and sore throat.   Eyes: Negative for pain and visual disturbance.  Respiratory: Negative for cough and shortness of breath.   Cardiovascular: Negative for chest pain and palpitations.  Gastrointestinal: Negative for abdominal pain and vomiting.  Genitourinary: Negative for dysuria and hematuria.  Musculoskeletal: Positive for arthralgias. Negative for back pain.  Skin: Negative for color change and rash.  Neurological: Negative for seizures and syncope.  All other systems reviewed and are negative.   Physical Exam Updated Vital Signs BP (!) 147/78 (BP Location: Left Arm)   Pulse 88    Temp 98 F (36.7 C) (Oral)   Resp 20   SpO2 98%   Physical Exam Vitals and nursing note reviewed.  Constitutional:      Appearance: Normal appearance.  HENT:     Head: Normocephalic and atraumatic.     Right Ear: External ear normal.     Left Ear: External ear normal.     Nose: Nose normal.     Mouth/Throat:     Mouth: Mucous membranes are moist.  Eyes:     Extraocular Movements: Extraocular movements intact.     Pupils: Pupils are equal, round, and reactive to light.  Cardiovascular:     Rate and Rhythm: Normal rate and regular rhythm.  Pulmonary:     Effort: Pulmonary effort is normal.  Abdominal:     General: Abdomen is flat.     Palpations: Abdomen is soft.     Tenderness: There is no abdominal tenderness.  There is no guarding or rebound.  Musculoskeletal:     Cervical back: Normal range of motion. No tenderness.     Comments: Tenderness palpation of proximal upper right arm.  Radial pulses equal bilaterally.  Compartments are soft.  No significant deformity.  Full range of motion in fingers, wrist, and elbow joint.  Pain with motion of shoulder joint.  Able to make thumbs up, peace sign, AB duct and adductor fingers, and A-OK sign without difficulty.  Skin:    General: Skin is warm.     Capillary Refill: Capillary refill takes less than 2 seconds.  Neurological:     General: No focal deficit present.     Mental Status: He is alert and oriented to person, place, and time.     Cranial Nerves: No cranial nerve deficit.     Sensory: No sensory deficit.  Psychiatric:        Mood and Affect: Mood normal.        Behavior: Behavior normal.     ED Results / Procedures / Treatments   Labs (all labs ordered are listed, but only abnormal results are displayed) Labs Reviewed - No data to display  EKG None  Radiology DG Shoulder Right  Result Date: 11/04/2020 CLINICAL DATA:  fall EXAM: RIGHT SHOULDER - 2+ VIEW COMPARISON:  None. FINDINGS: There is a mild displaced  impacted surgical neck fracture of the proximal humerus with likely involvement of the greater tuberosity. There is mild glenohumeral and AC joint degenerative change. IMPRESSION: Mildly displaced and impacted surgical neck fracture of the proximal humerus likely extending into the greater tuberosity. Electronically Signed   By: Maurine Simmering   On: 11/04/2020 14:46   CT Head Wo Contrast  Result Date: 11/04/2020 CLINICAL DATA:  Head trauma fall EXAM: CT HEAD WITHOUT CONTRAST TECHNIQUE: Contiguous axial images were obtained from the base of the skull through the vertex without intravenous contrast. COMPARISON:  None. FINDINGS: Brain: No acute territorial infarction, hemorrhage or intracranial mass. Mild atrophy. Nonenlarged ventricles. Vascular: No hyperdense vessels.  No unexpected calcification Skull: Normal. Negative for fracture or focal lesion. Sinuses/Orbits: Mild mucosal thickening in the sinuses. Other: None IMPRESSION: 1. No CT evidence for acute intracranial abnormality. 2. Mild atrophy Electronically Signed   By: Donavan Foil M.D.   On: 11/04/2020 16:36   DG Humerus Right  Result Date: 11/04/2020 CLINICAL DATA:  fall EXAM: RIGHT HUMERUS - 2+ VIEW COMPARISON:  None. FINDINGS: Mildly displaced and impacted surgical neck fracture of the proximal humerus with likely extension into the greater tuberosity. There is no evidence of distal humerus fracture. There is chronic heterotopic ossification adjacent to the medial epicondyle of the elbow. IMPRESSION: Mildly displaced and impacted surgical neck fracture of the proximal humerus likely extending into the greater tuberosity. No distal humerus fracture. Electronically Signed   By: Maurine Simmering   On: 11/04/2020 14:47    Procedures Procedures   Medications Ordered in ED Medications  acetaminophen (TYLENOL) tablet 1,000 mg (has no administration in time range)    ED Course  I have reviewed the triage vital signs and the nursing notes.  Pertinent  labs & imaging results that were available during my care of the patient were reviewed by me and considered in my medical decision making (see chart for details).    MDM Rules/Calculators/A&P                          Upon evaluation, vital signs  are stable.  Patient is in no acute distress.  Exam significant for right upper arm tenderness.  No signs of compartment syndrome.  Differential includes strain, sprain, fracture, dislocation  Presentation was consistent with fracture.  Seen on x-ray.  Mildly displaced.  Right-sided shoulder immobilizer applied.  Orthopedic surgery referral provided.  Patient feels comfortable going home.  We discussed symptomatic management and return precautions.  He declined any kind of narcotic pain medication.  Recommended close PCP follow-up and orthopedic surgery follow-up.   Final Clinical Impression(s) / ED Diagnoses Final diagnoses:  Closed displaced fracture of surgical neck of right humerus, unspecified fracture morphology, initial encounter    Rx / DC Orders ED Discharge Orders    None       Suzan Nailer, DO 11/04/20 1836    Drenda Freeze, MD 11/04/20 [phone]

## 2020-11-04 NOTE — Progress Notes (Signed)
Orthopedic Tech Progress Note Patient Details:  Joseph [name] 1934/11/03 [id number]  Ortho Devices Type of Ortho Device: Sling immobilizer Ortho Device/Splint Location: RUE Ortho Device/Splint Interventions: Ordered,Application,Adjustment   Post Interventions Patient Tolerated: Well Instructions Provided: Care of [place] 11/04/2020, 7:08 PM

## 2020-11-04 NOTE — ED Provider Notes (Signed)
Emergency Medicine Provider Triage Evaluation Note  Joseph [name] 86 y.o. M was evaluated in triage.  Pt complains of right shoulder pain after mechanical fall.  Reports that he was walking around in the house and states he tripped and fell, hit his shoulder on the sofa.  He also thinks he hit his head.  No LOC.  He is on aspirin.  No other blood thinners.  Reports pain when he tries to move his right arm.  No neck pain, numbness/weakness.  Review of Systems  Positive: Shoulder pain Negative: Neck pain, numbness/weakness.  Physical Exam  BP 134/82   Pulse 70   Temp 98.2 F (36.8 C) (Oral)   Resp 18   Ht 5\' 4"  (1.626 m)   Wt 65.8 kg   SpO2 100%   BMI 24.89 kg/m  Gen:   Awake, no distress   HEENT:  Atraumatic  Resp:  Normal effort  Cardiac:  Normal rate. 2+ radial pulses bilaterally. Abd:   Nondistended, nontender  MSK:   Moves extremities without difficulty. No midline cervical spine tenderness.  Limited range of motion of the right upper extremity secondary to pain.  No bony tenderness noted to right elbow, right forearm.  No tenderness palpation to left upper extremity.  Full range of motion without any difficulty. Neuro:  Speech clear   Medical Decision Making  Medically screening exam initiated at 3:55 AM.  Appropriate orders placed.  Joseph [name] was informed that the remainder of the evaluation will be completed by another provider, this initial triage assessment does not replace that evaluation, and the importance of remaining in the ED until their evaluation is complete.    Clinical Impression  Shoulder pain   Portions of this note were generated with Dragon dictation software. Dictation errors may occur despite best attempts at proofreading.     Volanda Napoleon, PA-C 11/04/20 1402    Lacretia Leigh, MD 11/05/20 [phone]

## 2020-11-11 ENCOUNTER — Telehealth: Payer: Self-pay

## 2020-11-11 DIAGNOSIS — S42294A Other nondisplaced fracture of upper end of right humerus, initial encounter for closed fracture: Secondary | ICD-10-CM | POA: Diagnosis not present

## 2020-11-11 NOTE — Telephone Encounter (Signed)
Per St, ok to stop lisinopril due to dizziness

## 2020-11-20 ENCOUNTER — Telehealth: Payer: Self-pay | Admitting: Emergency Medicine

## 2020-11-20 NOTE — Telephone Encounter (Signed)
I think we reviewed his pulmonary function testing the day he did them at his office visit on 09/04/2020 --they did not show any evidence of abnormal airflow.  This is good news and probably explains why he did not get much response from the inhaled medication that we tried.  His chest x-ray was normal and did not show any evidence of scarring.  This is also good news.  If he continues to have shortness of breath then I think it would be reasonable to consider doing a cardiopulmonary exercise test.  If he would like to do so that I can arrange it for him.

## 2020-11-20 NOTE — Telephone Encounter (Signed)
Called and spoke with patient to let him know the recs of Dr. Lamonte Sakai. Patient states that at some point he had a ECHO on 09/14/2019 , heart cath on 11/28/2019 and states that he did the chemical cardiac stress test. I see that he had a PCV Myocardial Perfusion with Lexiscan on 09/18/2019. Patient states that he is still using the Anoro inhaler but having some shortness of breath still.   Dr. Lamonte Sakai please advise

## 2020-11-20 NOTE — Telephone Encounter (Signed)
Called and spoke with patient who is calling to get his results from his PFT and CXR from 09/04/20. He states that he never heard anything in regards to them.  Dr. Lamonte Sakai please advise

## 2020-11-21 NOTE — Telephone Encounter (Signed)
Called and spoke with patient, advised of recommendations per Dr. Lamonte Sakai, he verbalized understanding.  He is unsure whether the Anoro is helping or not, he will decide one way or another.  He does not use the albuterol very often.  He states he gets sob just doing very simples things like walking to the kitchen and fixing something to eat. When he rests, the sob resolves.  He says he used to walk daily and then the sob started so he has not been active lately.  He has seen his heart doctor and had tests there as well.  He will contact his heart doctor and see if there is any other test they can do so see if it is a heart issue since the test for the lungs have not really shown anything.  Advised to call our office if he needs something or if symptoms worsen or do not resolve.  He verbalized understanding.  Nothing further needed.

## 2020-11-21 NOTE — Telephone Encounter (Signed)
His PFT don't show significant lung disease. I'm not sure I have any other recs to make. He could try stopping the Anoro if he doesn;t believe it is doing much for him.

## 2020-11-22 ENCOUNTER — Telehealth: Payer: Self-pay

## 2020-11-22 NOTE — Telephone Encounter (Signed)
Pt called regarding bp readings that were requested after stopping lisinopril.  May 10th:  132/65 am   150/86 pm  May 11th: 140/70 am   147/75 pm  May 12th: 151/77 am   141/20 pm  May 13th;  141/66 am   141/68 pm  May 14th;  143/70 am   131/68 pm  May 15th;  135/67 am   141/70 pm  May 16th:  135/71 am   146/71 pm  May 18th;  145/74 am   154/77 pm  May 19th:  153/81 am   151/77pm  May 20th;  143/75 am

## 2020-11-25 DIAGNOSIS — S42294D Other nondisplaced fracture of upper end of right humerus, subsequent encounter for fracture with routine healing: Secondary | ICD-10-CM | POA: Diagnosis not present

## 2020-11-25 NOTE — Telephone Encounter (Signed)
Error

## 2020-11-26 NOTE — Telephone Encounter (Signed)
Have him restart lisinopril in the morning. Update the MAR.

## 2020-11-27 MED ORDER — LISINOPRIL 10 MG PO TABS: 10.0000 mg | ORAL_TABLET | Freq: Every day | ORAL | 3 refills | Status: DC

## 2020-11-27 NOTE — Telephone Encounter (Signed)
Lisinopril 10mg  once daily?

## 2020-11-27 NOTE — Telephone Encounter (Signed)
Yes in morning, update mar please

## 2020-11-27 NOTE — Telephone Encounter (Signed)
Pt stated that the lisinopril makes him dizzy and "fuzzy." Pt would like to know if there is any other medication he can take instead.

## 2020-11-27 NOTE — Telephone Encounter (Signed)
D/C lisinopril  Send in script for Losartan 12.5mg  po day.  BMP and Mg level a week later. Pls place the labs and release them.

## 2020-11-29 ENCOUNTER — Other Ambulatory Visit: Payer: Self-pay | Admitting: Student

## 2020-11-29 ENCOUNTER — Telehealth: Payer: Self-pay

## 2020-11-29 MED ORDER — LOSARTAN POTASSIUM 25 MG PO TABS: 12.5000 mg | ORAL_TABLET | Freq: Every day | ORAL | 3 refills | Status: DC

## 2020-11-29 NOTE — Progress Notes (Signed)
Patient called stating pharmacy did not have prescription. Verified pharmacy and resent order for losartan.

## 2020-11-29 NOTE — Telephone Encounter (Signed)
Called pt, pt is aware. Changed medications.

## 2020-12-03 ENCOUNTER — Other Ambulatory Visit: Payer: Self-pay

## 2020-12-03 DIAGNOSIS — E7800 Pure hypercholesterolemia, unspecified: Secondary | ICD-10-CM | POA: Diagnosis not present

## 2020-12-03 DIAGNOSIS — E039 Hypothyroidism, unspecified: Secondary | ICD-10-CM | POA: Diagnosis not present

## 2020-12-03 DIAGNOSIS — I10 Essential (primary) hypertension: Secondary | ICD-10-CM | POA: Diagnosis not present

## 2020-12-03 MED ORDER — LOSARTAN POTASSIUM 25 MG PO TABS: 12.5000 mg | ORAL_TABLET | Freq: Every day | ORAL | 3 refills | Status: DC

## 2020-12-09 NOTE — Telephone Encounter (Signed)
Error

## 2020-12-16 DIAGNOSIS — S42294D Other nondisplaced fracture of upper end of right humerus, subsequent encounter for fracture with routine healing: Secondary | ICD-10-CM | POA: Diagnosis not present

## 2020-12-23 ENCOUNTER — Telehealth: Payer: Self-pay

## 2020-12-23 DIAGNOSIS — M25611 Stiffness of right shoulder, not elsewhere classified: Secondary | ICD-10-CM | POA: Diagnosis not present

## 2020-12-23 DIAGNOSIS — S42201D Unspecified fracture of upper end of right humerus, subsequent encounter for fracture with routine healing: Secondary | ICD-10-CM | POA: Diagnosis not present

## 2020-12-23 DIAGNOSIS — M6281 Muscle weakness (generalized): Secondary | ICD-10-CM | POA: Diagnosis not present

## 2020-12-25 ENCOUNTER — Telehealth: Payer: Self-pay

## 2020-12-25 NOTE — Telephone Encounter (Signed)
Patient called to let you know that his blood pressure is still high. He said that you started him on Losartan but it is noticing his blood pressure is still up over the last week. He wants to know if you want him to come in to be seen. Please advise.

## 2020-12-25 NOTE — Telephone Encounter (Signed)
Have him increase losartan to 25mg  po qday.  Order BMP in one week to check Cr and K.

## 2020-12-25 NOTE — Telephone Encounter (Signed)
Sent a message to CM please follow up and type the BP readings into the trend for reference. TY ST

## 2020-12-26 NOTE — Telephone Encounter (Signed)
Called patient, Joseph [name], LMAM

## 2020-12-27 ENCOUNTER — Other Ambulatory Visit: Payer: Self-pay

## 2020-12-27 DIAGNOSIS — N183 Chronic kidney disease, stage 3 unspecified: Secondary | ICD-10-CM

## 2020-12-27 DIAGNOSIS — I129 Hypertensive chronic kidney disease with stage 1 through stage 4 chronic kidney disease, or unspecified chronic kidney disease: Secondary | ICD-10-CM

## 2020-12-30 NOTE — Telephone Encounter (Signed)
Patient's bp  06/15 morning 142/68, night 129/57  06/16 morning 132/71, night 130/59  06/17 morning 134/71, night 141/70  06/18 morning 164/80, night 159/78  06/19 morning 154/86, night 139/54  Monday 145/76

## 2021-01-01 DIAGNOSIS — S42201D Unspecified fracture of upper end of right humerus, subsequent encounter for fracture with routine healing: Secondary | ICD-10-CM | POA: Diagnosis not present

## 2021-01-01 DIAGNOSIS — M6281 Muscle weakness (generalized): Secondary | ICD-10-CM | POA: Diagnosis not present

## 2021-01-01 DIAGNOSIS — M25611 Stiffness of right shoulder, not elsewhere classified: Secondary | ICD-10-CM | POA: Diagnosis not present

## 2021-01-01 DIAGNOSIS — R5383 Other fatigue: Secondary | ICD-10-CM | POA: Diagnosis not present

## 2021-01-01 DIAGNOSIS — I10 Essential (primary) hypertension: Secondary | ICD-10-CM | POA: Diagnosis not present

## 2021-01-02 DIAGNOSIS — I10 Essential (primary) hypertension: Secondary | ICD-10-CM | POA: Diagnosis not present

## 2021-01-02 DIAGNOSIS — E039 Hypothyroidism, unspecified: Secondary | ICD-10-CM | POA: Diagnosis not present

## 2021-01-02 DIAGNOSIS — E7800 Pure hypercholesterolemia, unspecified: Secondary | ICD-10-CM | POA: Diagnosis not present

## 2021-01-02 DIAGNOSIS — I2510 Atherosclerotic heart disease of native coronary artery without angina pectoris: Secondary | ICD-10-CM | POA: Diagnosis not present

## 2021-01-08 DIAGNOSIS — M25611 Stiffness of right shoulder, not elsewhere classified: Secondary | ICD-10-CM | POA: Diagnosis not present

## 2021-01-08 DIAGNOSIS — S42201D Unspecified fracture of upper end of right humerus, subsequent encounter for fracture with routine healing: Secondary | ICD-10-CM | POA: Diagnosis not present

## 2021-01-08 DIAGNOSIS — M6281 Muscle weakness (generalized): Secondary | ICD-10-CM | POA: Diagnosis not present

## 2021-01-13 DIAGNOSIS — S42201D Unspecified fracture of upper end of right humerus, subsequent encounter for fracture with routine healing: Secondary | ICD-10-CM | POA: Diagnosis not present

## 2021-01-15 DIAGNOSIS — R7303 Prediabetes: Secondary | ICD-10-CM | POA: Diagnosis not present

## 2021-01-15 DIAGNOSIS — E7801 Familial hypercholesterolemia: Secondary | ICD-10-CM | POA: Diagnosis not present

## 2021-01-15 DIAGNOSIS — I10 Essential (primary) hypertension: Secondary | ICD-10-CM | POA: Diagnosis not present

## 2021-01-16 DIAGNOSIS — N320 Bladder-neck obstruction: Secondary | ICD-10-CM | POA: Diagnosis not present

## 2021-01-16 DIAGNOSIS — M6281 Muscle weakness (generalized): Secondary | ICD-10-CM | POA: Diagnosis not present

## 2021-01-16 DIAGNOSIS — R972 Elevated prostate specific antigen [PSA]: Secondary | ICD-10-CM | POA: Diagnosis not present

## 2021-01-16 DIAGNOSIS — R102 Pelvic and perineal pain: Secondary | ICD-10-CM | POA: Diagnosis not present

## 2021-01-16 DIAGNOSIS — M25611 Stiffness of right shoulder, not elsewhere classified: Secondary | ICD-10-CM | POA: Diagnosis not present

## 2021-01-16 DIAGNOSIS — S42201D Unspecified fracture of upper end of right humerus, subsequent encounter for fracture with routine healing: Secondary | ICD-10-CM | POA: Diagnosis not present

## 2021-01-16 DIAGNOSIS — N529 Male erectile dysfunction, unspecified: Secondary | ICD-10-CM | POA: Diagnosis not present

## 2021-01-16 DIAGNOSIS — G8929 Other chronic pain: Secondary | ICD-10-CM | POA: Diagnosis not present

## 2021-01-16 DIAGNOSIS — N401 Enlarged prostate with lower urinary tract symptoms: Secondary | ICD-10-CM | POA: Diagnosis not present

## 2021-01-16 DIAGNOSIS — N411 Chronic prostatitis: Secondary | ICD-10-CM | POA: Diagnosis not present

## 2021-01-20 DIAGNOSIS — R7301 Impaired fasting glucose: Secondary | ICD-10-CM | POA: Diagnosis not present

## 2021-01-20 DIAGNOSIS — I700 Atherosclerosis of aorta: Secondary | ICD-10-CM | POA: Diagnosis not present

## 2021-01-20 DIAGNOSIS — J449 Chronic obstructive pulmonary disease, unspecified: Secondary | ICD-10-CM | POA: Diagnosis not present

## 2021-01-20 DIAGNOSIS — I6529 Occlusion and stenosis of unspecified carotid artery: Secondary | ICD-10-CM | POA: Diagnosis not present

## 2021-01-20 DIAGNOSIS — I2510 Atherosclerotic heart disease of native coronary artery without angina pectoris: Secondary | ICD-10-CM | POA: Diagnosis not present

## 2021-01-20 DIAGNOSIS — R748 Abnormal levels of other serum enzymes: Secondary | ICD-10-CM | POA: Diagnosis not present

## 2021-01-20 DIAGNOSIS — I10 Essential (primary) hypertension: Secondary | ICD-10-CM | POA: Diagnosis not present

## 2021-01-20 DIAGNOSIS — N401 Enlarged prostate with lower urinary tract symptoms: Secondary | ICD-10-CM | POA: Diagnosis not present

## 2021-01-20 DIAGNOSIS — E039 Hypothyroidism, unspecified: Secondary | ICD-10-CM | POA: Diagnosis not present

## 2021-01-20 DIAGNOSIS — Z0000 Encounter for general adult medical examination without abnormal findings: Secondary | ICD-10-CM | POA: Diagnosis not present

## 2021-01-20 DIAGNOSIS — R911 Solitary pulmonary nodule: Secondary | ICD-10-CM | POA: Diagnosis not present

## 2021-01-22 DIAGNOSIS — S42201D Unspecified fracture of upper end of right humerus, subsequent encounter for fracture with routine healing: Secondary | ICD-10-CM | POA: Diagnosis not present

## 2021-01-22 DIAGNOSIS — M6281 Muscle weakness (generalized): Secondary | ICD-10-CM | POA: Diagnosis not present

## 2021-01-22 DIAGNOSIS — M25611 Stiffness of right shoulder, not elsewhere classified: Secondary | ICD-10-CM | POA: Diagnosis not present

## 2021-01-27 DIAGNOSIS — M6281 Muscle weakness (generalized): Secondary | ICD-10-CM | POA: Diagnosis not present

## 2021-01-27 DIAGNOSIS — M25611 Stiffness of right shoulder, not elsewhere classified: Secondary | ICD-10-CM | POA: Diagnosis not present

## 2021-01-27 DIAGNOSIS — S42201D Unspecified fracture of upper end of right humerus, subsequent encounter for fracture with routine healing: Secondary | ICD-10-CM | POA: Diagnosis not present

## 2021-01-28 DIAGNOSIS — L820 Inflamed seborrheic keratosis: Secondary | ICD-10-CM | POA: Diagnosis not present

## 2021-01-28 DIAGNOSIS — B078 Other viral warts: Secondary | ICD-10-CM | POA: Diagnosis not present

## 2021-01-28 DIAGNOSIS — X32XXXD Exposure to sunlight, subsequent encounter: Secondary | ICD-10-CM | POA: Diagnosis not present

## 2021-01-28 DIAGNOSIS — L570 Actinic keratosis: Secondary | ICD-10-CM | POA: Diagnosis not present

## 2021-02-01 ENCOUNTER — Other Ambulatory Visit: Payer: Self-pay | Admitting: Cardiology

## 2021-02-02 DIAGNOSIS — E7800 Pure hypercholesterolemia, unspecified: Secondary | ICD-10-CM | POA: Diagnosis not present

## 2021-02-02 DIAGNOSIS — I10 Essential (primary) hypertension: Secondary | ICD-10-CM | POA: Diagnosis not present

## 2021-02-02 DIAGNOSIS — E039 Hypothyroidism, unspecified: Secondary | ICD-10-CM | POA: Diagnosis not present

## 2021-03-05 DIAGNOSIS — I2510 Atherosclerotic heart disease of native coronary artery without angina pectoris: Secondary | ICD-10-CM | POA: Diagnosis not present

## 2021-03-05 DIAGNOSIS — E7800 Pure hypercholesterolemia, unspecified: Secondary | ICD-10-CM | POA: Diagnosis not present

## 2021-03-05 DIAGNOSIS — I10 Essential (primary) hypertension: Secondary | ICD-10-CM | POA: Diagnosis not present

## 2021-03-05 DIAGNOSIS — E039 Hypothyroidism, unspecified: Secondary | ICD-10-CM | POA: Diagnosis not present

## 2021-03-18 DIAGNOSIS — M8000XA Age-related osteoporosis with current pathological fracture, unspecified site, initial encounter for fracture: Secondary | ICD-10-CM | POA: Diagnosis not present

## 2021-03-18 DIAGNOSIS — M810 Age-related osteoporosis without current pathological fracture: Secondary | ICD-10-CM | POA: Diagnosis not present

## 2021-04-04 DIAGNOSIS — E039 Hypothyroidism, unspecified: Secondary | ICD-10-CM | POA: Diagnosis not present

## 2021-04-04 DIAGNOSIS — I2510 Atherosclerotic heart disease of native coronary artery without angina pectoris: Secondary | ICD-10-CM | POA: Diagnosis not present

## 2021-04-04 DIAGNOSIS — I10 Essential (primary) hypertension: Secondary | ICD-10-CM | POA: Diagnosis not present

## 2021-04-04 DIAGNOSIS — E7800 Pure hypercholesterolemia, unspecified: Secondary | ICD-10-CM | POA: Diagnosis not present

## 2021-04-07 ENCOUNTER — Encounter (INDEPENDENT_AMBULATORY_CARE_PROVIDER_SITE_OTHER): Payer: PPO | Admitting: Ophthalmology

## 2021-04-15 ENCOUNTER — Ambulatory Visit: Payer: PPO | Admitting: Cardiology

## 2021-05-02 ENCOUNTER — Ambulatory Visit: Payer: PPO | Admitting: Cardiology

## 2021-05-05 DIAGNOSIS — I2510 Atherosclerotic heart disease of native coronary artery without angina pectoris: Secondary | ICD-10-CM | POA: Diagnosis not present

## 2021-05-05 DIAGNOSIS — I10 Essential (primary) hypertension: Secondary | ICD-10-CM | POA: Diagnosis not present

## 2021-05-05 DIAGNOSIS — E039 Hypothyroidism, unspecified: Secondary | ICD-10-CM | POA: Diagnosis not present

## 2021-05-05 DIAGNOSIS — E7800 Pure hypercholesterolemia, unspecified: Secondary | ICD-10-CM | POA: Diagnosis not present

## 2021-05-06 IMAGING — DX DG CHEST 2V
2 series · 2 of 2 positions shown · non-contrast
Comparison: 06/24/2016

CLINICAL DATA: Chest pain

EXAM:
CHEST - 2 VIEW

[chest pa]
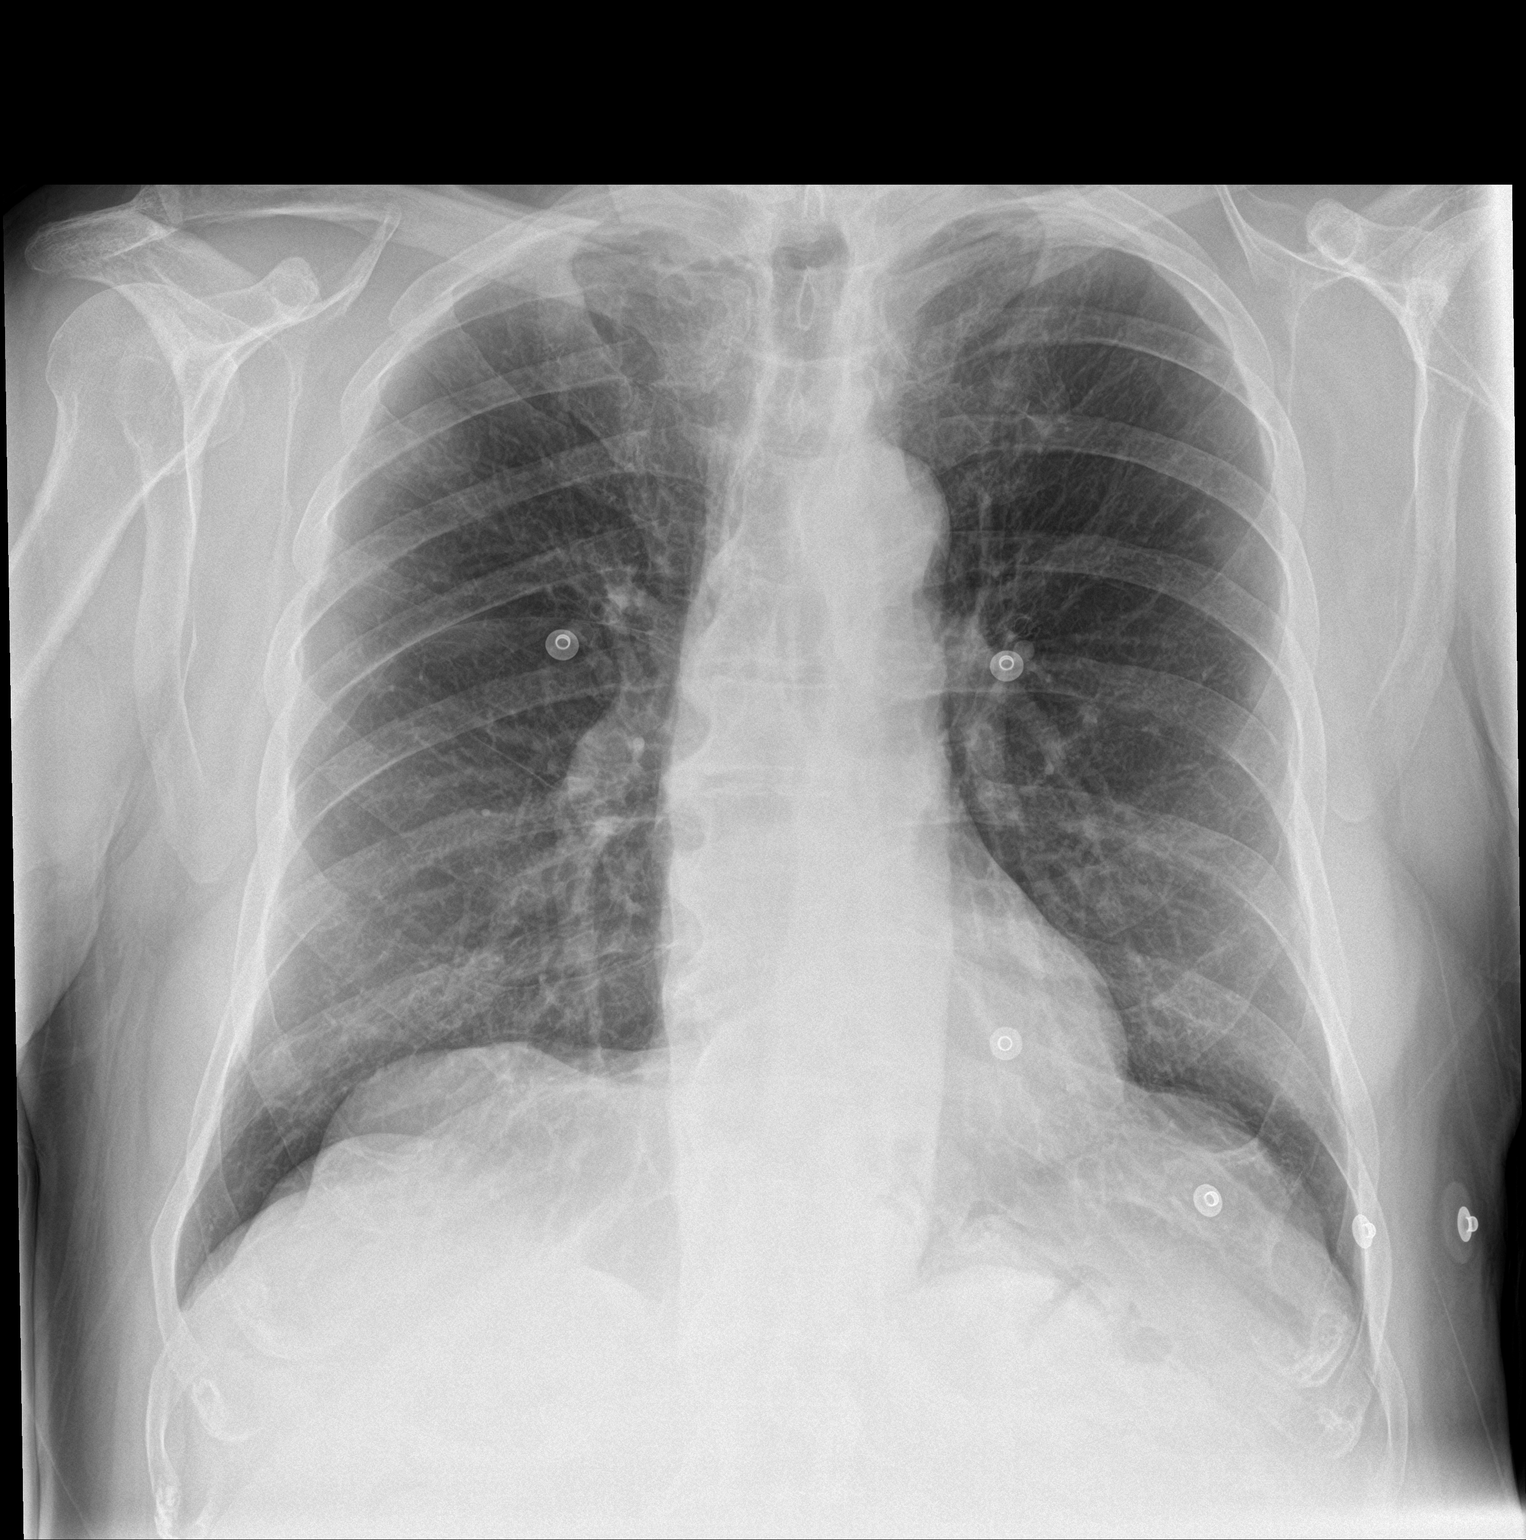

[chest lat]
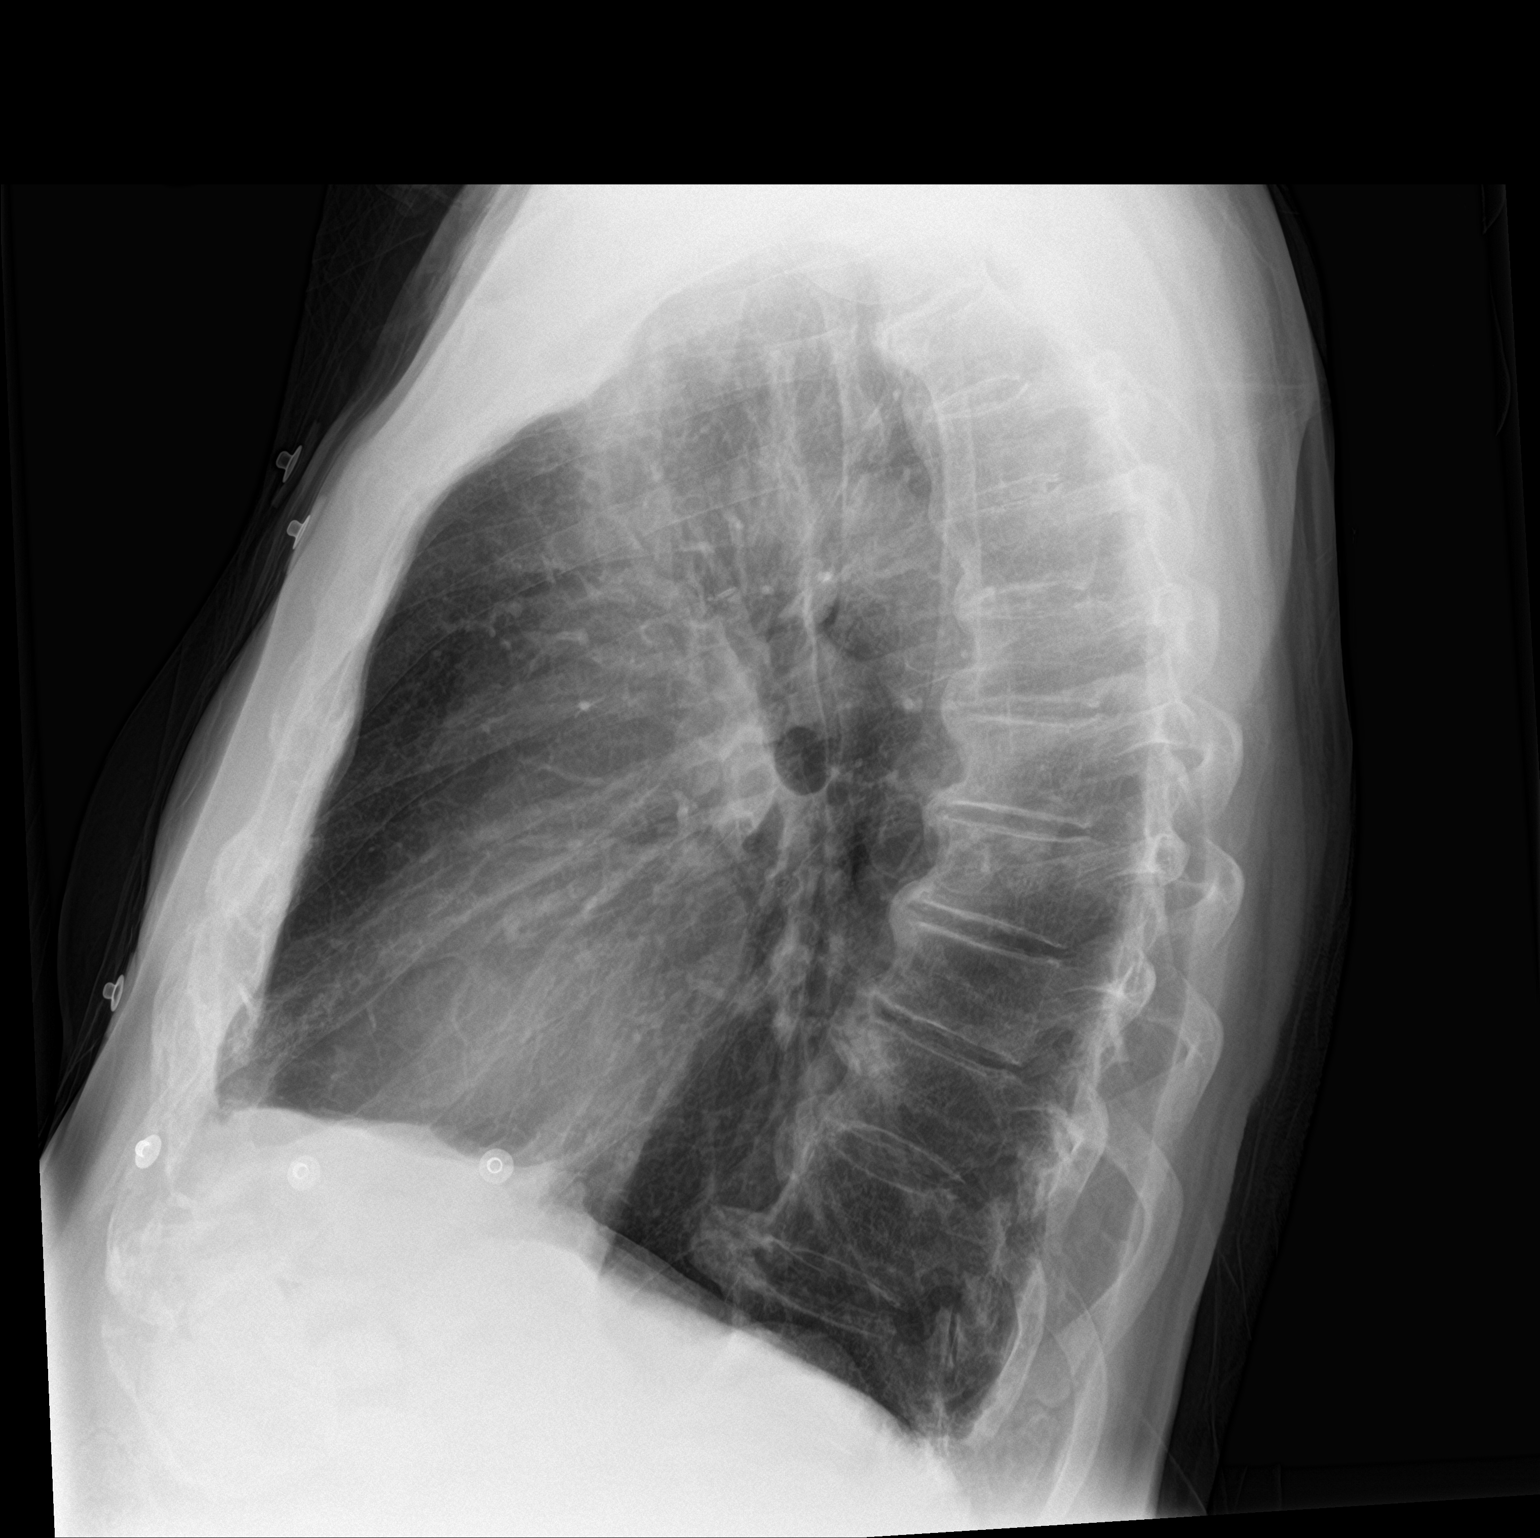

[2 of 2 positions shown; findings below may reference images not displayed]

FINDINGS: The heart size and mediastinal contours are within normal limits.
Focal eventration of the right hemidiaphragm. No focal airspace
consolidation, pleural effusion, or pneumothorax. Degenerative
changes of the thoracic spine.
IMPRESSION: No active cardiopulmonary disease.

## 2021-05-11 NOTE — Progress Notes (Signed)
Joseph [name] Date of Birth: 1934-09-20 MRN: [id number] Primary Care Provider:Pharr, Thayer Jew, MD  Date: 05/12/21 Last Office Visit: 10/14/20  Chief Complaint  Patient presents with   Dyspnea on exertion   Follow-up    HPI  Joseph [name] is a 86 y.o.  male who presents to the office with a chief complaint of " 35-monthfollow-up for shortness of breath." Patient's past medical history and cardiovascular risk factors include: Hypertension, hyperlipidemia, advanced age, former smoker, asthma.  Initially referred to the office for evaluation of dyspnea on exertion and since then has undergone an ischemic evaluation as outlined below.  He presents today for 622-monthollow-up.  No chest pain at rest or with effort related activities.  His dyspnea on exertion is improving but still present.  He no longer experiences dizziness with changing positions or with walking.  Patient used to be on inhalers but states that they did not work well for him and was recommended to stop them by pulmonary medicine.  Patient states that his home blood pressures are well controlled usually around 13734mHg systolic.  Since last office visit he has started losartan 12.5 mg p.o. daily and is tolerating the medication well without any side effects or intolerances.  No use of sublingual nitroglycerin tablets.  No hospitalizations or urgent care visits for cardiovascular symptoms.  FUNCTIONAL STATUS: walks 1.0 miles on a daily basis.   ALLERGIES: Allergies  Allergen Reactions   Ciprofloxacin Anaphylaxis and Other (See Comments)    Caused sores & blisters and a trip to the hospital- was also taking Aldara at the same time, however   Ciprocin-Fluocin-Procin [Fluocinolone Acetonide] Other (See Comments)    Blisters and sores   Fluocinolone Acetonide Other (See Comments)    Reaction not recalled   Quinolones Other (See Comments)    Pt had to go to hospital (sores and blisters)   Statins Other (See Comments)     Made the legs hurt   Imiquimod Other (See Comments)    (Aldara) Caused sores that landed him in the hospital for 3 days- was taking Cipro at the same time, however   Penicillins Itching, Rash and Other (See Comments)    Has patient had a PCN reaction causing immediate rash, facial/tongue/throat swelling, SOB or lightheadedness with hypotension: Yes Has patient had a PCN reaction causing severe rash involving mucus membranes or skin necrosis: No Has patient had a PCN reaction that required hospitalization: No Has patient had a PCN reaction occurring within the last 10 years: No If all of the above answers are "NO", then may proceed with Cephalosporin use.      MEDICATION LIST PRIOR TO VISIT: Current Outpatient Medications on File Prior to Visit  Medication Sig Dispense Refill   albuterol (VENTOLIN HFA) 108 (90 Base) MCG/ACT inhaler Inhale 2 puffs into the lungs every 6 (six) hours as needed for wheezing or shortness of breath.     amLODipine (NORVASC) 10 MG tablet TAKE 1 TABLET BY MOUTH EVERY EVENING. 90 tablet 3   aspirin 81 MG chewable tablet Chew 81 mg by mouth daily.     levothyroxine (SYNTHROID) 137 MCG tablet Take 137 mcg by mouth daily before breakfast. Was told on 11/06/19 by PCP not to take     losartan (COZAAR) 25 MG tablet Take 12.5 mg by mouth at bedtime.     lovastatin (MEVACOR) 40 MG tablet Take 40 mg by mouth daily.      magnesium oxide (MAG-OX) 400 MG tablet Take  400 mg by mouth daily.     meloxicam (MOBIC) 7.5 MG tablet Take 7.5 mg by mouth daily as needed for pain.      nitroGLYCERIN (NITROSTAT) 0.4 MG SL tablet Place 1 tablet (0.4 mg total) under the tongue every 5 (five) minutes as needed for chest pain. 30 tablet 3   omeprazole (PRILOSEC) 40 MG capsule Take 20 mg by mouth daily before breakfast.     No current facility-administered medications on file prior to visit.    PAST MEDICAL HISTORY: Past Medical History:  Diagnosis Date   Arthritis    COPD (chronic  obstructive pulmonary disease) ([place])    Dyspnea    on exertion   Hyperlipidemia    Hypertension    Pneumothorax, spontaneous, tension 1960   SOB (shortness of breath) on exertion     PAST SURGICAL HISTORY: Past Surgical History:  Procedure Laterality Date   EYE SURGERY     bilateral cataract with lens implants   LEFT HEART CATH AND CORONARY ANGIOGRAPHY N/A 11/28/2019   Procedure: LEFT HEART CATH AND CORONARY ANGIOGRAPHY;  Surgeon: Nigel Mormon, MD;  Location: [facility];  Service: Cardiovascular;  Laterality: N/A;   LUNG SURGERY     40 years ago   TOE SURGERY     TOTAL KNEE ARTHROPLASTY Left 10/06/2017   Procedure: LEFT TOTAL KNEE ARTHROPLASTY;  Surgeon: Susa Day, MD;  Location: [facility];  Service: Orthopedics;  Laterality: Left;  Adductor Block    FAMILY HISTORY: No family history of premature coronary disease or sudden cardiac death.   SOCIAL HISTORY:  The patient  reports that he quit smoking about 62 years ago. His smoking use included cigarettes. He has a 5.00 pack-year smoking history. He has never used smokeless tobacco. He reports that he does not drink alcohol and does not use drugs.  Review of Systems  Constitutional: Negative for chills, fever and malaise/fatigue.  HENT:  Negative for ear discharge, ear pain and nosebleeds.   Eyes:  Negative for blurred vision and discharge.  Cardiovascular:  Negative for chest pain, claudication, dyspnea on exertion, leg swelling, near-syncope, orthopnea, palpitations, paroxysmal nocturnal dyspnea and syncope.  Respiratory:  Positive for shortness of breath (chronic and stable). Negative for cough and snoring.   Endocrine: Negative for polydipsia, polyphagia and polyuria.  Hematologic/Lymphatic: Negative for bleeding problem.  Skin:  Negative for flushing and nail changes.  Musculoskeletal:  Negative for muscle cramps, muscle weakness and myalgias.  Gastrointestinal:  Negative for abdominal pain, dysphagia,  hematemesis, hematochezia, melena, nausea and vomiting.  Neurological:  Negative for dizziness, focal weakness and light-headedness.   PHYSICAL EXAM: Vitals with BMI 05/12/2021 11/04/2020 11/04/2020  Height _0  - -  Weight 187 lbs - -  BMI 45.40 - -  Systolic 981 191 478  Diastolic 77 69 78  Pulse 74 86 88   CONSTITUTIONAL: Well-developed and well-nourished. No acute distress.  SKIN: Skin is warm and dry. No rash noted. No cyanosis. No pallor. No jaundice HEAD: Normocephalic and atraumatic.  EYES: No scleral icterus MOUTH/THROAT: Moist oral membranes.  NECK: No JVD present. No thyromegaly noted. No carotid bruits  LYMPHATIC: No visible cervical adenopathy.  CHEST Normal respiratory effort. No intercostal retractions  LUNGS: Expiratory wheezes noted in the right posterior chest wall.  Equal rise and fall in chest cavity.  No rales or rhonchi's. CARDIOVASCULAR: Regular rate and rhythm, positive S1-S2, no murmurs rubs or gallops appreciated. ABDOMINAL: Soft, nontender, nondistended, positive bowel sounds in all 4 quadrants.  No apparent ascites.  EXTREMITIES: No peripheral edema, warm to touch. HEMATOLOGIC: No significant bruising NEUROLOGIC: Oriented to person, place, and time. Nonfocal. Normal muscle tone.  PSYCHIATRIC: Normal mood and affect. Normal behavior. Cooperative  EKG: 05/12/2021: Normal sinus rhythm, 68 bpm, nonspecific T wave abnormality, without underlying injury pattern.    Echocardiogram: 09/2019: LVEF 55-60%, mild LVH, normal diastolic filling pressures, normal left atrial pressures, mildly dilated left atrium, mild MR, mild TR. Large liver cyst (6.9x7.8cm) noted. Consider dedicated imaging, if clinically indicated.   Stress Testing:  Lexiscan 09/11/2019: Nondiagnostic ECG stress. The baseline blood pressure was 80/54 mmHg and decreased to 78/54 mmHg at peak infusion, which is a physiologic response to Intravenous Lexiscan. Patient asymptomatic. Resting EKG demonstrated  normal sinus rhythm. Non-specific ST-T abnormality. Peak EKG revealed no significant ST-T change from baseline abnormality. Myocardial perfusion is normal. Stress LV EF: 52%. No previous exam available for comparison. Low risk study.    Heart Catheterization: 11/28/2019 by Dr. Joya Gaskins Patwardhan: Normal epicardial coronary arteries.  Normal LVEDP.   Carotid Duplex: 16/57/9038: Peak systolic velocities in the right bifurcation, internal, external and common carotid arteries are within normal limits. Minimal stenosis in the left internal carotid artery (minimal) with homogenous plaque.  Right vertebral artery flow is not visualized. Antegrade left vertebral artery flow.  14 - day [facility] Tele[facility]. Enrollment Period: 10/18/2019-10/31/2019 Predominant rhythm normal sinus with heart rate ranging from 45 - 121 bpm, average heart rate 69 bpm. No pauses greater than or equal to 2.5 seconds. No atrial fibrillation detected during the monitoring period.   Total supraventricular ectopic burden <1% (323 supraventricular ectopy, 16 supraventricular runs). Longest run was 22 beats in duration at 162 bpm. The fastest run was 3 beats in duration at 171 bpm. Total ventricular ectopic burden <1% (6925 ventricular ectopy, 0 ventricular pairs and 0 ventricular runs). Heart rate < 60 bpm for 27.8% of the recording. Heart rate > 100 bpm for 3.4% of the recording. No Auto triggered events.   21 Patient triggered events reviewed, not associated with any significant arrhythmia (baseline artifact in many tracings).   Recent labs: 09/08/2019: Glucose 110, BUN/Cr 130.93 EGFR >60. Na/K 140/4.1.  H/H 15.6/48.8.   LABORATORY DATA: CBC Latest Ref Rng & Units 08/06/2020 11/28/2019 09/08/2019  WBC 4.0 - 10.5 K/uL 9.3 9.1 7.0  Hemoglobin 13.0 - 17.0 g/dL 15.8 13.9 15.6  Hematocrit 39.0 - 52.0 % 47.8 42.3 48.8  Platelets 150.0 - 400.0 K/uL 164.0 193 178    CMP Latest Ref Rng & Units 11/24/2019 10/05/2019  09/25/2019  Glucose 65 - 99 mg/dL 105(H) 115(H) 121(H)  BUN 8 - 27 mg/dL _0 Creatinine 0.76 - 1.27 mg/dL 0.96 1.06 1.16  Sodium 134 - 144 mmol/L 141 140 141  Potassium 3.5 - 5.2 mmol/L 4.6 4.4 3.6  Chloride 96 - 106 mmol/L 104 100 100  CO2 20 - 29 mmol/L 18(L) 22 24  Calcium 8.6 - 10.2 mg/dL 9.9 9.9 9.7  Total Protein 6.1 - 8.1 g/dL - - -  Total Bilirubin 0.2 - 1.2 mg/dL - - -  Alkaline Phos 39 - 117 U/L - - -  AST 10 - 35 U/L - - -  ALT 9 - 46 U/L - - -    Lipid Panel     Component Value Date/Time   CHOL 139 09/25/2019 0813   TRIG 122 09/25/2019 0813   HDL 45 09/25/2019 0813   LDLCALC 72 09/25/2019 0813   LABVLDL 22 09/25/2019 0813  FINAL MEDICATION LIST END OF ENCOUNTER: No orders of the defined types were placed in this encounter.   Medications Discontinued During This Encounter  Medication Reason   augmented betamethasone dipropionate (DIPROLENE-AF) 0.05 % cream Error   umeclidinium-vilanterol (ANORO ELLIPTA) 62.5-25 MCG/INH AEPB Error   Dutasteride-Tamsulosin HCl 0.5-0.4 MG CAPS Error      Current Outpatient Medications:    albuterol (VENTOLIN HFA) 108 (90 Base) MCG/ACT inhaler, Inhale 2 puffs into the lungs every 6 (six) hours as needed for wheezing or shortness of breath., Disp: , Rfl:    amLODipine (NORVASC) 10 MG tablet, TAKE 1 TABLET BY MOUTH EVERY EVENING., Disp: 90 tablet, Rfl: 3   aspirin 81 MG chewable tablet, Chew 81 mg by mouth daily., Disp: , Rfl:    levothyroxine (SYNTHROID) 137 MCG tablet, Take 137 mcg by mouth daily before breakfast. Was told on 11/06/19 by PCP not to take, Disp: , Rfl:    losartan (COZAAR) 25 MG tablet, Take 12.5 mg by mouth at bedtime., Disp: , Rfl:    lovastatin (MEVACOR) 40 MG tablet, Take 40 mg by mouth daily. , Disp: , Rfl:    magnesium oxide (MAG-OX) 400 MG tablet, Take 400 mg by mouth daily., Disp: , Rfl:    meloxicam (MOBIC) 7.5 MG tablet, Take 7.5 mg by mouth daily as needed for pain. , Disp: , Rfl:    nitroGLYCERIN  (NITROSTAT) 0.4 MG SL tablet, Place 1 tablet (0.4 mg total) under the tongue every 5 (five) minutes as needed for chest pain., Disp: 30 tablet, Rfl: 3   omeprazole (PRILOSEC) 40 MG capsule, Take 20 mg by mouth daily before breakfast., Disp: , Rfl:   IMPRESSION:    ICD-10-CM   1. Dyspnea on exertion  R06.09 EKG 12-Lead    2. Essential hypertension, benign  I10     3. Mixed hyperlipidemia  E78.2     4. Former smoker  Z87.891        RECOMMENDATIONS: ESAW KNIPPEL is a 86 y.o. male whose past medical history and cardiovascular risk factors include: Hypertension, hyperlipidemia, advanced age, former smoker.  Dyspnea on exertion Chronic and stable. Euvolemic and not in congestive heart failure. Office and home blood pressures are within acceptable range. Has establish care with pulmonary medicine. In the past we have discussed considering Zio patch to reevaluate PVC burden-however since he is relatively stable since last office encounter he would like to hold off on additional testing at this time. Medications reconciled.  Essential hypertension, benign Home and blood pressure home and office blood pressures are within acceptable range. Medications reconciled. Since last office visit he was started on losartan 12.5 mg p.o. daily he is tolerating the medication well. No longer experiences dizziness with changing positions or with prolonged ambulation. Reemphasized the importance of low-salt diet.  Mixed hyperlipidemia Currently on Lovastatin.   He denies myalgia or other side effects. Most recent lipids checked by PCP per patient. Encouraged to fax a copy to the office next time for review / reference. Currently managed by primary care provider.  Former smoker Educated on the importance of continued smoking cessation.   Orders Placed This Encounter  Procedures   EKG 12-Lead    --Continue cardiac medications as reconciled in final medication list. --Return in about 6 months  (around 11/09/2021) for Follow up, Dyspnea. Or sooner if needed. --Continue follow-up with your primary care physician regarding the management of your other chronic comorbid conditions.  Patient's questions and concerns were addressed to his satisfaction.  He voices understanding of the instructions provided during this encounter.   This note was created using a voice recognition software as a result there may be grammatical errors inadvertently enclosed that do not reflect the nature of this encounter. Every attempt is made to correct such errors.  Total time spent: 30 minutes.  Rex Kras, [place], [facility]  Pager: [phone] Office: [phone]

## 2021-05-12 ENCOUNTER — Ambulatory Visit: Payer: PPO | Admitting: Cardiology

## 2021-05-12 ENCOUNTER — Other Ambulatory Visit: Payer: Self-pay

## 2021-05-12 ENCOUNTER — Encounter: Payer: Self-pay | Admitting: Cardiology

## 2021-05-12 VITALS — BP 137/77 | HR 74 | Resp 16 | Ht 72.0 in | Wt 187.0 lb

## 2021-05-12 DIAGNOSIS — Z87891 Personal history of nicotine dependence: Secondary | ICD-10-CM | POA: Diagnosis not present

## 2021-05-12 DIAGNOSIS — E782 Mixed hyperlipidemia: Secondary | ICD-10-CM

## 2021-05-12 DIAGNOSIS — R0609 Other forms of dyspnea: Secondary | ICD-10-CM

## 2021-05-12 DIAGNOSIS — I10 Essential (primary) hypertension: Secondary | ICD-10-CM

## 2021-06-03 DIAGNOSIS — L820 Inflamed seborrheic keratosis: Secondary | ICD-10-CM | POA: Diagnosis not present

## 2021-06-03 DIAGNOSIS — B078 Other viral warts: Secondary | ICD-10-CM | POA: Diagnosis not present

## 2021-06-04 DIAGNOSIS — E039 Hypothyroidism, unspecified: Secondary | ICD-10-CM | POA: Diagnosis not present

## 2021-06-04 DIAGNOSIS — I2510 Atherosclerotic heart disease of native coronary artery without angina pectoris: Secondary | ICD-10-CM | POA: Diagnosis not present

## 2021-06-04 DIAGNOSIS — I10 Essential (primary) hypertension: Secondary | ICD-10-CM | POA: Diagnosis not present

## 2021-06-04 DIAGNOSIS — E7800 Pure hypercholesterolemia, unspecified: Secondary | ICD-10-CM | POA: Diagnosis not present

## 2021-07-04 DIAGNOSIS — E039 Hypothyroidism, unspecified: Secondary | ICD-10-CM | POA: Diagnosis not present

## 2021-07-04 DIAGNOSIS — I10 Essential (primary) hypertension: Secondary | ICD-10-CM | POA: Diagnosis not present

## 2021-07-04 DIAGNOSIS — E7800 Pure hypercholesterolemia, unspecified: Secondary | ICD-10-CM | POA: Diagnosis not present

## 2021-07-04 DIAGNOSIS — I2510 Atherosclerotic heart disease of native coronary artery without angina pectoris: Secondary | ICD-10-CM | POA: Diagnosis not present

## 2021-07-17 DIAGNOSIS — R3911 Hesitancy of micturition: Secondary | ICD-10-CM | POA: Diagnosis not present

## 2021-07-17 DIAGNOSIS — R3589 Other polyuria: Secondary | ICD-10-CM | POA: Diagnosis not present

## 2021-07-17 DIAGNOSIS — Z87438 Personal history of other diseases of male genital organs: Secondary | ICD-10-CM | POA: Diagnosis not present

## 2021-07-28 ENCOUNTER — Encounter (INDEPENDENT_AMBULATORY_CARE_PROVIDER_SITE_OTHER): Payer: Self-pay | Admitting: Ophthalmology

## 2021-07-28 ENCOUNTER — Ambulatory Visit (INDEPENDENT_AMBULATORY_CARE_PROVIDER_SITE_OTHER): Payer: PPO | Admitting: Ophthalmology

## 2021-07-28 ENCOUNTER — Other Ambulatory Visit: Payer: Self-pay

## 2021-07-28 DIAGNOSIS — H35371 Puckering of macula, right eye: Secondary | ICD-10-CM

## 2021-07-28 DIAGNOSIS — H353131 Nonexudative age-related macular degeneration, bilateral, early dry stage: Secondary | ICD-10-CM | POA: Diagnosis not present

## 2021-07-28 DIAGNOSIS — H33321 Round hole, right eye: Secondary | ICD-10-CM

## 2021-07-28 NOTE — Assessment & Plan Note (Signed)
OD condition resolved, irregular foveal contour remain generally good acuity

## 2021-07-28 NOTE — Progress Notes (Signed)
07/28/2021     CHIEF COMPLAINT Patient presents for  Chief Complaint  Patient presents with   Retina Evaluation      HISTORY OF PRESENT ILLNESS: Joseph [name] is a 87 y.o. male who presents to the clinic today for:   HPI     Retina Evaluation           Laterality: right eye   Onset: years ago   Context: distance vision   MD Performed: performed the HPI with the patient and updated documentation appropriately         Comments   Continued waviness of vision occurring in the right eye      Last edited by Hurman Horn, MD on 07/28/2021  8:02 AM.      Referring physician: Deland Pretty, MD [place],   [address]  HISTORICAL INFORMATION:   Selected notes from the [facility]: No current outpatient medications on file. (Ophthalmic Drugs)   No current facility-administered medications for this visit. (Ophthalmic Drugs)   Current Outpatient Medications (Other)  Medication Sig   albuterol (VENTOLIN HFA) 108 (90 Base) MCG/ACT inhaler Inhale 2 puffs into the lungs every 6 (six) hours as needed for wheezing or shortness of breath.   amLODipine (NORVASC) 10 MG tablet TAKE 1 TABLET BY MOUTH EVERY EVENING.   aspirin 81 MG chewable tablet Chew 81 mg by mouth daily.   levothyroxine (SYNTHROID) 137 MCG tablet Take 137 mcg by mouth daily before breakfast. Was told on 11/06/19 by PCP not to take   losartan (COZAAR) 25 MG tablet Take 12.5 mg by mouth at bedtime.   lovastatin (MEVACOR) 40 MG tablet Take 40 mg by mouth daily.    magnesium oxide (MAG-OX) 400 MG tablet Take 400 mg by mouth daily.   meloxicam (MOBIC) 7.5 MG tablet Take 7.5 mg by mouth daily as needed for pain.    nitroGLYCERIN (NITROSTAT) 0.4 MG SL tablet Place 1 tablet (0.4 mg total) under the tongue every 5 (five) minutes as needed for chest pain.   omeprazole (PRILOSEC) 40 MG capsule Take 20 mg by mouth daily before breakfast.   No current  facility-administered medications for this visit. (Other)      REVIEW OF SYSTEMS: ROS   Negative for: Constitutional, Gastrointestinal, Neurological, Skin, Genitourinary, Musculoskeletal, HENT, Endocrine, Cardiovascular, Eyes, Respiratory, Psychiatric, Allergic/Imm, Heme/Lymph Last edited by Hurman Horn, MD on 07/28/2021  8:02 AM.       ALLERGIES Allergies  Allergen Reactions   Ciprofloxacin Anaphylaxis and Other (See Comments)    Caused sores & blisters and a trip to the hospital- was also taking Aldara at the same time, however   Ciprocin-Fluocin-Procin [Fluocinolone Acetonide] Other (See Comments)    Blisters and sores   Fluocinolone Acetonide Other (See Comments)    Reaction not recalled   Quinolones Other (See Comments)    Pt had to go to hospital (sores and blisters)   Statins Other (See Comments)    Made the legs hurt   Imiquimod Other (See Comments)    (Aldara) Caused sores that landed him in the hospital for 3 days- was taking Cipro at the same time, however   Penicillins Itching, Rash and Other (See Comments)    Has patient had a PCN reaction causing immediate rash, facial/tongue/throat swelling, SOB or lightheadedness with hypotension: Yes Has patient had a PCN reaction causing severe rash involving mucus membranes or skin necrosis: No  Has patient had a PCN reaction that required hospitalization: No Has patient had a PCN reaction occurring within the last 10 years: No If all of the above answers are "NO", then may proceed with Cephalosporin use.     PAST MEDICAL HISTORY Past Medical History:  Diagnosis Date   Arthritis    COPD (chronic obstructive pulmonary disease) ([place])    Dyspnea    on exertion   Hyperlipidemia    Hypertension    Macular pucker, right eye 01/11/2020   The nature of macular pucker (epiretinal membrane ERM) was discussed with the patient as well as threshold criteria for vitrectomy surgery. I explained that in rare cases another surgery is  needed to actually remove a second wrinkle should it regrow.  Most often, the epiretinal membrane and underlying wrinkled internal limiting membrane are removed with the first surgery, to accomplish the goals.    Pneumothorax, spontaneous, tension 1960   SOB (shortness of breath) on exertion    Past Surgical History:  Procedure Laterality Date   EYE SURGERY     bilateral cataract with lens implants   LEFT HEART CATH AND CORONARY ANGIOGRAPHY N/A 11/28/2019   Procedure: LEFT HEART CATH AND CORONARY ANGIOGRAPHY;  Surgeon: Nigel Mormon, MD;  Location: [facility];  Service: Cardiovascular;  Laterality: N/A;   LUNG SURGERY     40 years ago   TOE SURGERY     TOTAL KNEE ARTHROPLASTY Left 10/06/2017   Procedure: LEFT TOTAL KNEE ARTHROPLASTY;  Surgeon: Susa Day, MD;  Location: [facility];  Service: Orthopedics;  Laterality: Left;  Adductor Block    FAMILY HISTORY No family history on file.  SOCIAL HISTORY Social History   Tobacco Use   Smoking status: Former    Packs/day: 1.00    Years: 5.00    Pack years: 5.00    Types: Cigarettes    Quit date: 11/24/1958    Years since quitting: 62.7   Smokeless tobacco: Never  Vaping Use   Vaping Use: Never used  Substance Use Topics   Alcohol use: No   Drug use: No         OPHTHALMIC EXAM:  Base Eye Exam     Visual Acuity (ETDRS)       Right Left   Dist [place] 20/20 -2 20/25 +2         Tonometry (Tonopen, 8:01 AM)       Right Left   Pressure 8 7         Pupils       Pupils APD   Right PERRL None   Left PERRL None         Visual Fields       Left Right    Full Full         Extraocular Movement       Right Left    Full, Ortho Full, Ortho         Neuro/Psych     Oriented x3: Yes   Mood/Affect: Normal         Dilation     Both eyes: 1.0% Mydriacyl, 2.5% Phenylephrine @ 8:02 AM           Slit Lamp and Fundus Exam     External Exam       Right Left   External Normal Normal          Slit Lamp Exam       Right Left   Lids/Lashes Normal Normal  Conjunctiva/Sclera White and quiet White and quiet   Cornea Clear Clear   Anterior Chamber Deep and quiet Deep and quiet   Iris Round and reactive Round and reactive   Lens  Centered posterior chamber intraocular lens, 1+ Posterior capsular opacification inf to vis axis Posterior chamber intraocular lens   Anterior Vitreous Normal Normal         Fundus Exam       Right Left   Posterior Vitreous Vitrectomized, clear    Disc Normal    C/D Ratio 0.55    Macula Less topo distortion, Soft drusen, Early age related macular degeneration    Vessels Normal    Periphery Normal, retinopexy present superotemporal, no new retinal tears             IMAGING AND PROCEDURES  Imaging and Procedures for 07/28/21  OCT, Retina - OU - Both Eyes       Right Eye Quality was good. Scan locations included subfoveal. Central Foveal Thickness: 365. Progression has improved. Findings include abnormal foveal contour.   Left Eye Quality was good. Scan locations included subfoveal. Central Foveal Thickness: 274. Progression has been stable. Findings include normal foveal contour, no SRF, retinal drusen , no IRF.   Notes No active disease OD No signs of wet ARMD Vastly improved macular anatomy status post membrane peel removal of ILM July 2021. No shaded with acuity improvement OD             ASSESSMENT/PLAN:  Macular pucker, right eye OD condition resolved, irregular foveal contour remain generally good acuity  Early stage nonexudative age-related macular degeneration of both eyes The nature of age--related macular degeneration was discussed with the patient as well as the distinction between dry and wet types. Checking an Amsler Grid daily with advice to return immediately should a distortion develop, was given to the patient. The patient 's smoking status now and in the past was determined and advice based on the  AREDS study was provided regarding the consumption of antioxidant supplements. AREDS 2 vitamin formulation was recommended. Consumption of dark leafy vegetables and fresh fruits of various colors was recommended. Treatment modalities for wet macular degeneration particularly the use of intravitreal injections of anti-blood vessel growth factors was discussed with the patient. Avastin, Lucentis, and Eylea are the available options. On occasion, therapy includes the use of photodynamic therapy and thermal laser. Stressed to the patient do not rub eyes.  Patient was advised to check Amsler Grid daily and return immediately if changes are noted. Instructions on using the grid were given to the patient. All patient questions were answered.  Round hole of right eye And no new breaks OD     ICD-10-CM   1. Macular pucker, right eye  H35.371 OCT, Retina - OU - Both Eyes    2. Early stage nonexudative age-related macular degeneration of both eyes  H35.3131     3. Round hole of right eye  H33.321       1.  OU with early stage ARMD.  Patient has option to use oral eye vitamins, AREDS 2 formulation  2.  History of macular pucker and repaired July 2021 with excellent visual acuity recovery and stabilization  3.  No new retinal breaks OU  Ophthalmic Meds Ordered this visit:  No orders of the defined types were placed in this encounter.      Return in about 1 year (around 07/28/2022) for DILATE OU, OCT.  There are no Patient Instructions on file  for this visit.   Explained the diagnoses, plan, and follow up with the patient and they expressed understanding.  Patient expressed understanding of the importance of proper follow up care.   Clent Demark Lindee Leason M.D. Diseases & Surgery of the Retina and Vitreous [facility] [place] 07/28/21     Abbreviations: M myopia (nearsighted); A astigmatism; H hyperopia (farsighted); P presbyopia; Mrx spectacle prescription;  CTL contact lenses; OD right  eye; OS left eye; OU both eyes  XT exotropia; ET esotropia; PEK punctate epithelial keratitis; PEE punctate epithelial erosions; DES dry eye syndrome; MGD meibomian gland dysfunction; ATs artificial tears; PFAT's preservative free artificial tears; [place] nuclear sclerotic cataract; PSC posterior subcapsular cataract; ERM epi-retinal membrane; PVD posterior vitreous detachment; RD retinal detachment; DM diabetes mellitus; DR diabetic retinopathy; NPDR non-proliferative diabetic retinopathy; PDR proliferative diabetic retinopathy; CSME clinically significant macular edema; DME diabetic macular edema; dbh dot blot hemorrhages; CWS cotton wool spot; POAG primary open angle glaucoma; C/D cup-to-disc ratio; HVF humphrey visual field; GVF goldmann visual field; OCT optical coherence tomography; IOP intraocular pressure; BRVO Branch retinal vein occlusion; CRVO central retinal vein occlusion; CRAO central retinal artery occlusion; BRAO branch retinal artery occlusion; RT retinal tear; SB scleral buckle; PPV pars plana vitrectomy; VH Vitreous hemorrhage; PRP panretinal laser photocoagulation; IVK intravitreal kenalog; VMT vitreomacular traction; MH Macular hole;  NVD neovascularization of the disc; NVE neovascularization elsewhere; AREDS age related eye disease study; ARMD age related macular degeneration; POAG primary open angle glaucoma; EBMD epithelial/anterior basement membrane dystrophy; ACIOL anterior chamber intraocular lens; IOL intraocular lens; PCIOL posterior chamber intraocular lens; Phaco/IOL phacoemulsification with intraocular lens placement; Pearson photorefractive keratectomy; LASIK laser assisted in situ keratomileusis; HTN hypertension; DM diabetes mellitus; COPD chronic obstructive pulmonary disease

## 2021-07-28 NOTE — Assessment & Plan Note (Signed)

## 2021-07-28 NOTE — Assessment & Plan Note (Signed)
And no new breaks OD

## 2021-08-06 DIAGNOSIS — R102 Pelvic and perineal pain: Secondary | ICD-10-CM | POA: Diagnosis not present

## 2021-08-06 DIAGNOSIS — G8929 Other chronic pain: Secondary | ICD-10-CM | POA: Diagnosis not present

## 2021-08-06 DIAGNOSIS — N320 Bladder-neck obstruction: Secondary | ICD-10-CM | POA: Diagnosis not present

## 2021-08-06 DIAGNOSIS — N529 Male erectile dysfunction, unspecified: Secondary | ICD-10-CM | POA: Diagnosis not present

## 2021-08-06 DIAGNOSIS — R972 Elevated prostate specific antigen [PSA]: Secondary | ICD-10-CM | POA: Diagnosis not present

## 2021-08-06 DIAGNOSIS — N411 Chronic prostatitis: Secondary | ICD-10-CM | POA: Diagnosis not present

## 2021-08-06 DIAGNOSIS — N401 Enlarged prostate with lower urinary tract symptoms: Secondary | ICD-10-CM | POA: Diagnosis not present

## 2021-08-26 DIAGNOSIS — L820 Inflamed seborrheic keratosis: Secondary | ICD-10-CM | POA: Diagnosis not present

## 2021-08-26 DIAGNOSIS — B078 Other viral warts: Secondary | ICD-10-CM | POA: Diagnosis not present

## 2021-11-02 DIAGNOSIS — E039 Hypothyroidism, unspecified: Secondary | ICD-10-CM | POA: Diagnosis not present

## 2021-11-02 DIAGNOSIS — E7800 Pure hypercholesterolemia, unspecified: Secondary | ICD-10-CM | POA: Diagnosis not present

## 2021-11-02 DIAGNOSIS — I10 Essential (primary) hypertension: Secondary | ICD-10-CM | POA: Diagnosis not present

## 2021-11-02 DIAGNOSIS — I2510 Atherosclerotic heart disease of native coronary artery without angina pectoris: Secondary | ICD-10-CM | POA: Diagnosis not present

## 2021-11-11 ENCOUNTER — Encounter: Payer: Self-pay | Admitting: Cardiology

## 2021-11-11 ENCOUNTER — Ambulatory Visit: Payer: PPO | Admitting: Cardiology

## 2021-11-11 VITALS — BP 110/57 | HR 81 | Temp 97.6°F | Resp 16 | Ht 72.0 in | Wt 190.0 lb

## 2021-11-11 DIAGNOSIS — I10 Essential (primary) hypertension: Secondary | ICD-10-CM | POA: Diagnosis not present

## 2021-11-11 DIAGNOSIS — R0609 Other forms of dyspnea: Secondary | ICD-10-CM | POA: Diagnosis not present

## 2021-11-11 DIAGNOSIS — E782 Mixed hyperlipidemia: Secondary | ICD-10-CM | POA: Diagnosis not present

## 2021-11-11 DIAGNOSIS — Z87891 Personal history of nicotine dependence: Secondary | ICD-10-CM

## 2021-11-11 NOTE — Progress Notes (Signed)
? ?Joseph [name] ?Date of Birth: 1934/10/11 ?MRN: [id number] ?Primary Care Provider:Pharr, Thayer Jew, MD ? ?Date: 11/11/21 ?Last Office Visit: 05/12/2021 ? ?Chief Complaint  ?Patient presents with  ? Shortness of Breath  ? Follow-up  ? ? ?HPI  ?Joseph [name] is a 87 y.o.  male whose past medical history and cardiovascular risk factors include: Hypertension, hyperlipidemia, advanced age, former smoker, asthma. ? ?Patient was referred to the practice for evaluation and management of dyspnea on exertion.  Since establishing care he has undergone an extensive cardiovascular work-up including invasive angiography which noted normal epicardial coronary arteries. ? ?He now presents for 21-monthfollow-up visit.  Over the last 6 months patient states that he remains asymptomatic with regards to heart failure symptoms or angina pectoris.  He is compliant with his medical therapy.  He has residual shortness of breath which has remained chronic and stable.  No use of sublingual nitroglycerin tablets or recent hospitalization. ? ?He continues to ambulate at least 1 mile per day without any significant change in overall physical endurance. ? ?ALLERGIES: ?Allergies  ?Allergen Reactions  ? Ciprofloxacin Anaphylaxis and Other (See Comments)  ?  Caused sores & blisters and a trip to the hospital- was also taking Aldara at the same time, however  ? Ciprocin-Fluocin-Procin [Fluocinolone Acetonide] Other (See Comments)  ?  Blisters and sores  ? Fluocinolone Acetonide Other (See Comments)  ?  Reaction not recalled  ? Quinolones Other (See Comments)  ?  Pt had to go to hospital (sores and blisters)  ? Statins Other (See Comments)  ?  Made the legs hurt  ? Imiquimod Other (See Comments)  ?  (Aldara) Caused sores that landed him in the hospital for 3 days- was taking Cipro at the same time, however  ? Penicillins Itching, Rash and Other (See Comments)  ?  Has patient had a PCN reaction causing immediate rash, facial/tongue/throat  swelling, SOB or lightheadedness with hypotension: Yes ?Has patient had a PCN reaction causing severe rash involving mucus membranes or skin necrosis: No ?Has patient had a PCN reaction that required hospitalization: No ?Has patient had a PCN reaction occurring within the last 10 years: No ?If all of the above answers are "NO", then may proceed with Cephalosporin use. ?  ? ? ? ?MEDICATION LIST PRIOR TO VISIT: ?Current Outpatient Medications on File Prior to Visit  ?Medication Sig Dispense Refill  ? albuterol (VENTOLIN HFA) 108 (90 Base) MCG/ACT inhaler Inhale 2 puffs into the lungs every 6 (six) hours as needed for wheezing or shortness of breath.    ? amLODipine (NORVASC) 10 MG tablet TAKE 1 TABLET BY MOUTH EVERY EVENING. 90 tablet 3  ? aspirin 81 MG chewable tablet Chew 81 mg by mouth daily.    ? levothyroxine (SYNTHROID) 137 MCG tablet Take 137 mcg by mouth daily before breakfast. Was told on 11/06/19 by PCP not to take    ? losartan (COZAAR) 25 MG tablet Take 12.5 mg by mouth at bedtime.    ? lovastatin (MEVACOR) 40 MG tablet Take 40 mg by mouth daily.     ? magnesium oxide (MAG-OX) 400 MG tablet Take 400 mg by mouth daily.    ? meloxicam (MOBIC) 7.5 MG tablet Take 7.5 mg by mouth daily as needed for pain.     ? nitroGLYCERIN (NITROSTAT) 0.4 MG SL tablet Place 1 tablet (0.4 mg total) under the tongue every 5 (five) minutes as needed for chest pain. 30 tablet 3  ? omeprazole (PRILOSEC) 40  MG capsule Take 20 mg by mouth daily before breakfast.    ? ?No current facility-administered medications on file prior to visit.  ? ? ?PAST MEDICAL HISTORY: ?Past Medical History:  ?Diagnosis Date  ? Arthritis   ? COPD (chronic obstructive pulmonary disease) ([place])   ? Dyspnea   ? on exertion  ? Hyperlipidemia   ? Hypertension   ? Macular pucker, right eye 01/11/2020  ? The nature of macular pucker (epiretinal membrane ERM) was discussed with the patient as well as threshold criteria for vitrectomy surgery. I explained that in rare  cases another surgery is needed to actually remove a second wrinkle should it regrow.  Most often, the epiretinal membrane and underlying wrinkled internal limiting membrane are removed with the first surgery, to accomplish the goals.   ? Pneumothorax, spontaneous, tension 1960  ? SOB (shortness of breath) on exertion   ? ? ?PAST SURGICAL HISTORY: ?Past Surgical History:  ?Procedure Laterality Date  ? EYE SURGERY    ? bilateral cataract with lens implants  ? LEFT HEART CATH AND CORONARY ANGIOGRAPHY N/A 11/28/2019  ? Procedure: LEFT HEART CATH AND CORONARY ANGIOGRAPHY;  Surgeon: Nigel Mormon, MD;  Location: [facility];  Service: Cardiovascular;  Laterality: N/A;  ? LUNG SURGERY    ? 40 years ago  ? TOE SURGERY    ? TOTAL KNEE ARTHROPLASTY Left 10/06/2017  ? Procedure: LEFT TOTAL KNEE ARTHROPLASTY;  Surgeon: Susa Day, MD;  Location: [facility];  Service: Orthopedics;  Laterality: Left;  Adductor Block  ? ? ?FAMILY HISTORY: ?No family history of premature coronary disease or sudden cardiac death. ?  ?SOCIAL HISTORY:  ?The patient  reports that he quit smoking about 63 years ago. His smoking use included cigarettes. He has a 5.00 pack-year smoking history. He has never used smokeless tobacco. He reports that he does not drink alcohol and does not use drugs. ? ?Review of Systems  ?Cardiovascular:  Negative for chest pain, dyspnea on exertion, leg swelling, orthopnea, palpitations, paroxysmal nocturnal dyspnea and syncope.  ?Respiratory:  Positive for shortness of breath (chronic and stable).   ? ?PHYSICAL EXAM: ? ?  11/11/2021  ?  2:08 PM 05/12/2021  ?  9:55 AM 11/04/2020  ?  6:55 PM  ?Vitals with BMI  ?Height '6\' 0"'$  '6\' 0"'$    ?Weight 190 lbs 187 lbs   ?BMI 25.76 25.36   ?Systolic 182 993 716  ?Diastolic 57 77 69  ?Pulse 81 74 86  ? ?CONSTITUTIONAL: Well-developed and well-nourished. No acute distress.  ?SKIN: Skin is warm and dry. No rash noted. No cyanosis. No pallor. No jaundice ?HEAD: Normocephalic and  atraumatic.  ?EYES: No scleral icterus ?MOUTH/THROAT: Moist oral membranes.  ?NECK: No JVD present. No thyromegaly noted. No carotid bruits  ?CHEST Normal respiratory effort. No intercostal retractions  ?LUNGS: Expiratory wheezes R>L.  Equal rise and fall in chest cavity.  No rales or rhonchi's. ?CARDIOVASCULAR: Regular rate and rhythm, positive S1-S2, no murmurs rubs or gallops appreciated. ?ABDOMINAL: Soft, nontender, nondistended, positive bowel sounds in all 4 quadrants.  No apparent ascites.  ?EXTREMITIES: No peripheral edema, warm to touch. ?HEMATOLOGIC: No significant bruising ?NEUROLOGIC: Oriented to person, place, and time. Nonfocal. Normal muscle tone.  ?PSYCHIATRIC: Normal mood and affect. Normal behavior. Cooperative ? ?EKG: ?11/11/2021: Normal sinus rhythm, 72 bpm, normal axis, without underlying ischemia injury pattern. ?  ?Echocardiogram: ?09/2019: LVEF 55-60%, mild LVH, normal diastolic filling pressures, normal left atrial pressures, mildly dilated left atrium, mild MR, mild  TR. Large liver cyst (6.9x7.8cm) noted. Consider dedicated imaging, if clinically indicated. ?  ?Stress Testing:  ?Lexiscan 09/11/2019: Nondiagnostic ECG stress. The baseline blood pressure was 80/54 mmHg and decreased to 78/54 mmHg at peak infusion, which is a physiologic response to Intravenous Lexiscan. Patient asymptomatic. Resting EKG demonstrated normal sinus rhythm. Non-specific ST-T abnormality. Peak EKG revealed no significant ST-T change from baseline abnormality. Myocardial perfusion is normal. ?Stress LV EF: 52%. No previous exam available for comparison. Low risk study.  ?  ?Heart Catheterization: ?11/28/2019 by Dr. Joya Gaskins Patwardhan: Normal epicardial coronary arteries.  Normal LVEDP. ?  ?Carotid Duplex: ?23/34/3568: Peak systolic velocities in the right bifurcation, internal, external and common carotid arteries are within normal limits. Minimal stenosis in the left internal carotid artery (minimal) with homogenous  plaque.  ?Right vertebral artery flow is not visualized. Antegrade left vertebral artery flow. ? ?14 - day [facility]ulatory Telemetry. ?Enrollment Period: 10/18/2019-10/31/2019 ?Predominant rhythm normal sinus

## 2021-11-17 ENCOUNTER — Other Ambulatory Visit: Payer: Self-pay | Admitting: Cardiology

## 2021-12-03 DIAGNOSIS — E7800 Pure hypercholesterolemia, unspecified: Secondary | ICD-10-CM | POA: Diagnosis not present

## 2021-12-03 DIAGNOSIS — I10 Essential (primary) hypertension: Secondary | ICD-10-CM | POA: Diagnosis not present

## 2021-12-03 DIAGNOSIS — E039 Hypothyroidism, unspecified: Secondary | ICD-10-CM | POA: Diagnosis not present

## 2021-12-03 DIAGNOSIS — I2510 Atherosclerotic heart disease of native coronary artery without angina pectoris: Secondary | ICD-10-CM | POA: Diagnosis not present

## 2021-12-18 DIAGNOSIS — R3589 Other polyuria: Secondary | ICD-10-CM | POA: Diagnosis not present

## 2022-01-01 DIAGNOSIS — R399 Unspecified symptoms and signs involving the genitourinary system: Secondary | ICD-10-CM | POA: Diagnosis not present

## 2022-01-02 DIAGNOSIS — J449 Chronic obstructive pulmonary disease, unspecified: Secondary | ICD-10-CM | POA: Diagnosis not present

## 2022-01-02 DIAGNOSIS — I10 Essential (primary) hypertension: Secondary | ICD-10-CM | POA: Diagnosis not present

## 2022-01-05 DIAGNOSIS — M25562 Pain in left knee: Secondary | ICD-10-CM | POA: Diagnosis not present

## 2022-01-16 DIAGNOSIS — R399 Unspecified symptoms and signs involving the genitourinary system: Secondary | ICD-10-CM | POA: Diagnosis not present

## 2022-01-20 DIAGNOSIS — R7303 Prediabetes: Secondary | ICD-10-CM | POA: Diagnosis not present

## 2022-01-20 DIAGNOSIS — E7800 Pure hypercholesterolemia, unspecified: Secondary | ICD-10-CM | POA: Diagnosis not present

## 2022-01-20 DIAGNOSIS — I10 Essential (primary) hypertension: Secondary | ICD-10-CM | POA: Diagnosis not present

## 2022-01-20 DIAGNOSIS — E039 Hypothyroidism, unspecified: Secondary | ICD-10-CM | POA: Diagnosis not present

## 2022-01-24 ENCOUNTER — Other Ambulatory Visit: Payer: Self-pay | Admitting: Cardiology

## 2022-01-28 DIAGNOSIS — Z0000 Encounter for general adult medical examination without abnormal findings: Secondary | ICD-10-CM | POA: Diagnosis not present

## 2022-01-28 DIAGNOSIS — I10 Essential (primary) hypertension: Secondary | ICD-10-CM | POA: Diagnosis not present

## 2022-01-28 DIAGNOSIS — J449 Chronic obstructive pulmonary disease, unspecified: Secondary | ICD-10-CM | POA: Diagnosis not present

## 2022-01-28 DIAGNOSIS — R911 Solitary pulmonary nodule: Secondary | ICD-10-CM | POA: Diagnosis not present

## 2022-01-28 DIAGNOSIS — K219 Gastro-esophageal reflux disease without esophagitis: Secondary | ICD-10-CM | POA: Diagnosis not present

## 2022-01-28 DIAGNOSIS — R42 Dizziness and giddiness: Secondary | ICD-10-CM | POA: Diagnosis not present

## 2022-01-28 DIAGNOSIS — E039 Hypothyroidism, unspecified: Secondary | ICD-10-CM | POA: Diagnosis not present

## 2022-01-28 DIAGNOSIS — M8000XA Age-related osteoporosis with current pathological fracture, unspecified site, initial encounter for fracture: Secondary | ICD-10-CM | POA: Diagnosis not present

## 2022-01-28 DIAGNOSIS — I6529 Occlusion and stenosis of unspecified carotid artery: Secondary | ICD-10-CM | POA: Diagnosis not present

## 2022-01-28 DIAGNOSIS — I700 Atherosclerosis of aorta: Secondary | ICD-10-CM | POA: Diagnosis not present

## 2022-01-28 DIAGNOSIS — I2510 Atherosclerotic heart disease of native coronary artery without angina pectoris: Secondary | ICD-10-CM | POA: Diagnosis not present

## 2022-01-28 DIAGNOSIS — R8271 Bacteriuria: Secondary | ICD-10-CM | POA: Diagnosis not present

## 2022-01-30 DIAGNOSIS — R399 Unspecified symptoms and signs involving the genitourinary system: Secondary | ICD-10-CM | POA: Diagnosis not present

## 2022-02-02 DIAGNOSIS — J449 Chronic obstructive pulmonary disease, unspecified: Secondary | ICD-10-CM | POA: Diagnosis not present

## 2022-02-02 DIAGNOSIS — I10 Essential (primary) hypertension: Secondary | ICD-10-CM | POA: Diagnosis not present

## 2022-02-12 DIAGNOSIS — N401 Enlarged prostate with lower urinary tract symptoms: Secondary | ICD-10-CM | POA: Diagnosis not present

## 2022-02-12 DIAGNOSIS — G8929 Other chronic pain: Secondary | ICD-10-CM | POA: Diagnosis not present

## 2022-02-12 DIAGNOSIS — N529 Male erectile dysfunction, unspecified: Secondary | ICD-10-CM | POA: Diagnosis not present

## 2022-02-12 DIAGNOSIS — R102 Pelvic and perineal pain: Secondary | ICD-10-CM | POA: Diagnosis not present

## 2022-02-12 DIAGNOSIS — N411 Chronic prostatitis: Secondary | ICD-10-CM | POA: Diagnosis not present

## 2022-02-12 DIAGNOSIS — R972 Elevated prostate specific antigen [PSA]: Secondary | ICD-10-CM | POA: Diagnosis not present

## 2022-02-16 DIAGNOSIS — R399 Unspecified symptoms and signs involving the genitourinary system: Secondary | ICD-10-CM | POA: Diagnosis not present

## 2022-02-17 DIAGNOSIS — L820 Inflamed seborrheic keratosis: Secondary | ICD-10-CM | POA: Diagnosis not present

## 2022-02-17 DIAGNOSIS — D225 Melanocytic nevi of trunk: Secondary | ICD-10-CM | POA: Diagnosis not present

## 2022-02-17 DIAGNOSIS — X32XXXD Exposure to sunlight, subsequent encounter: Secondary | ICD-10-CM | POA: Diagnosis not present

## 2022-02-17 DIAGNOSIS — L570 Actinic keratosis: Secondary | ICD-10-CM | POA: Diagnosis not present

## 2022-02-27 DIAGNOSIS — R399 Unspecified symptoms and signs involving the genitourinary system: Secondary | ICD-10-CM | POA: Diagnosis not present

## 2022-03-12 DIAGNOSIS — I10 Essential (primary) hypertension: Secondary | ICD-10-CM | POA: Diagnosis not present

## 2022-03-12 DIAGNOSIS — I951 Orthostatic hypotension: Secondary | ICD-10-CM | POA: Diagnosis not present

## 2022-03-13 DIAGNOSIS — R399 Unspecified symptoms and signs involving the genitourinary system: Secondary | ICD-10-CM | POA: Diagnosis not present

## 2022-03-18 DIAGNOSIS — I951 Orthostatic hypotension: Secondary | ICD-10-CM | POA: Diagnosis not present

## 2022-03-19 DIAGNOSIS — I10 Essential (primary) hypertension: Secondary | ICD-10-CM | POA: Diagnosis not present

## 2022-03-25 DIAGNOSIS — R399 Unspecified symptoms and signs involving the genitourinary system: Secondary | ICD-10-CM | POA: Diagnosis not present

## 2022-04-07 ENCOUNTER — Ambulatory Visit: Payer: PPO | Admitting: Cardiology

## 2022-04-07 ENCOUNTER — Encounter: Payer: Self-pay | Admitting: Cardiology

## 2022-04-07 VITALS — BP 144/70 | HR 84 | Temp 97.9°F | Resp 16 | Ht 72.0 in | Wt 177.6 lb

## 2022-04-07 DIAGNOSIS — R0609 Other forms of dyspnea: Secondary | ICD-10-CM

## 2022-04-07 DIAGNOSIS — I10 Essential (primary) hypertension: Secondary | ICD-10-CM

## 2022-04-07 DIAGNOSIS — I951 Orthostatic hypotension: Secondary | ICD-10-CM

## 2022-04-07 DIAGNOSIS — Z87891 Personal history of nicotine dependence: Secondary | ICD-10-CM

## 2022-04-07 DIAGNOSIS — E782 Mixed hyperlipidemia: Secondary | ICD-10-CM

## 2022-04-07 NOTE — Progress Notes (Signed)
Joseph [name] Date of Birth: 1934/10/18 MRN: [id number] Primary Care Provider:Pharr, Thayer Jew, MD  Date: 04/07/22 Last Office Visit: 11/11/2021  Chief Complaint  Patient presents with   Lower blood pressure    Follow-up    HPI  Joseph [name] is a 87 y.o.  male whose past medical history and cardiovascular risk factors include: Hypertension, hyperlipidemia, advanced age, former smoker, asthma.  Has been followed by the practice for dyspnea on exertion for which she is undergone a very thorough cardiovascular work-up.  Symptoms are likely secondary to underlying asthma.  Patient presents today for sooner office visit due to new onset of orthostatic hypotension. Approximately 2 weeks ago when he saw his PCP patient was noted to have hypotension with associated lightheaded and dizziness.  He was noted to have orthostatic hypotension and therefore amlodipine and losartan were discontinued.  Since the discontinuation of antihypertensive medications he is no longer experiencing lightheaded and dizziness.  He does not check his blood pressures at home.  Patient denies any near-syncope or syncope.  He stays well-hydrated, no loss of blood, consuming 3 balanced heart healthy meals.  ALLERGIES: Allergies  Allergen Reactions   Ciprofloxacin Anaphylaxis and Other (See Comments)    Caused sores & blisters and a trip to the hospital- was also taking Aldara at the same time, however   Ciprocin-Fluocin-Procin [Fluocinolone Acetonide] Other (See Comments)    Blisters and sores   Fluocinolone Acetonide Other (See Comments)    Reaction not recalled   Quinolones Other (See Comments)    Pt had to go to hospital (sores and blisters)   Statins Other (See Comments)    Made the legs hurt   Imiquimod Other (See Comments)    (Aldara) Caused sores that landed him in the hospital for 3 days- was taking Cipro at the same time, however   Penicillins Itching, Rash and Other (See Comments)    Has patient  had a PCN reaction causing immediate rash, facial/tongue/throat swelling, SOB or lightheadedness with hypotension: Yes Has patient had a PCN reaction causing severe rash involving mucus membranes or skin necrosis: No Has patient had a PCN reaction that required hospitalization: No Has patient had a PCN reaction occurring within the last 10 years: No If all of the above answers are "NO", then may proceed with Cephalosporin use.      MEDICATION LIST PRIOR TO VISIT: Current Outpatient Medications on File Prior to Visit  Medication Sig Dispense Refill   albuterol (VENTOLIN HFA) 108 (90 Base) MCG/ACT inhaler Inhale 2 puffs into the lungs every 6 (six) hours as needed for wheezing or shortness of breath.     aspirin 81 MG chewable tablet Chew 81 mg by mouth daily.     levothyroxine (SYNTHROID) 137 MCG tablet Take 137 mcg by mouth daily before breakfast. Was told on 11/06/19 by PCP not to take     lovastatin (MEVACOR) 40 MG tablet Take 40 mg by mouth daily.      magnesium oxide (MAG-OX) 400 MG tablet Take 400 mg by mouth daily.     meloxicam (MOBIC) 7.5 MG tablet Take 7.5 mg by mouth daily as needed for pain.      nitroGLYCERIN (NITROSTAT) 0.4 MG SL tablet Place 1 tablet (0.4 mg total) under the tongue every 5 (five) minutes as needed for chest pain. 30 tablet 3   omeprazole (PRILOSEC) 40 MG capsule Take 20 mg by mouth daily before breakfast.     No current facility-administered medications on file prior  to visit.    PAST MEDICAL HISTORY: Past Medical History:  Diagnosis Date   Arthritis    COPD (chronic obstructive pulmonary disease) ([place])    Dyspnea    on exertion   Hyperlipidemia    Hypertension    Macular pucker, right eye 01/11/2020   The nature of macular pucker (epiretinal membrane ERM) was discussed with the patient as well as threshold criteria for vitrectomy surgery. I explained that in rare cases another surgery is needed to actually remove a second wrinkle should it regrow.  Most  often, the epiretinal membrane and underlying wrinkled internal limiting membrane are removed with the first surgery, to accomplish the goals.    Pneumothorax, spontaneous, tension 1960   SOB (shortness of breath) on exertion     PAST SURGICAL HISTORY: Past Surgical History:  Procedure Laterality Date   EYE SURGERY     bilateral cataract with lens implants   LEFT HEART CATH AND CORONARY ANGIOGRAPHY N/A 11/28/2019   Procedure: LEFT HEART CATH AND CORONARY ANGIOGRAPHY;  Surgeon: Nigel Mormon, MD;  Location: [facility];  Service: Cardiovascular;  Laterality: N/A;   LUNG SURGERY     40 years ago   TOE SURGERY     TOTAL KNEE ARTHROPLASTY Left 10/06/2017   Procedure: LEFT TOTAL KNEE ARTHROPLASTY;  Surgeon: Susa Day, MD;  Location: [facility];  Service: Orthopedics;  Laterality: Left;  Adductor Block    FAMILY HISTORY: No family history of premature coronary disease or sudden cardiac death.   SOCIAL HISTORY:  The patient  reports that he quit smoking about 63 years ago. His smoking use included cigarettes. He has a 5.00 pack-year smoking history. He has never used smokeless tobacco. He reports that he does not drink alcohol and does not use drugs.  Review of Systems  Cardiovascular:  Negative for chest pain, dyspnea on exertion, leg swelling, orthopnea, palpitations, paroxysmal nocturnal dyspnea and syncope.  Respiratory:  Positive for shortness of breath (chronic and stable).     PHYSICAL EXAM:    04/07/2022   12:51 PM 11/11/2021    2:08 PM 05/12/2021    9:55 AM  Vitals with BMI  Height '6\' 0"'  '6\' 0"'  '6\' 0"'   Weight 177 lbs 10 oz 190 lbs 187 lbs  BMI 24.08 65.46 50.35  Systolic 465 681 275  Diastolic 70 57 77  Pulse 84 81 74   Orthostatic VS for the past 72 hrs (Last 3 readings):  Orthostatic BP Patient Position BP Location Cuff Size Orthostatic Pulse  04/07/22 1310 90/61 Standing Left Arm Normal 105  04/07/22 1309 115/68 Sitting Left Arm Normal 90  04/07/22 1308  120/76 Supine Left Arm Normal 84   Physical Exam  Constitutional: No distress.  Age appropriate, hemodynamically stable.   Neck: No JVD present.  Cardiovascular: Normal rate, regular rhythm, S1 normal, S2 normal, intact distal pulses and normal pulses. Exam reveals no gallop, no S3 and no S4.  No murmur heard. Pulmonary/Chest: Effort normal and breath sounds normal. No stridor. He has no wheezes. He has no rales.  Abdominal: Soft. Bowel sounds are normal. He exhibits no distension. There is no abdominal tenderness.  Musculoskeletal:        General: No edema.     Cervical back: Neck supple.  Neurological: He is alert and oriented to person, place, and time. He has intact cranial nerves (2-12).  Skin: Skin is warm and moist.   EKG: 04/07/2022: Normal sinus rhythm, 83 bpm, without underlying ischemia or injury  pattern.   Echocardiogram: 09/2019: LVEF 55-60%, mild LVH, normal diastolic filling pressures, normal left atrial pressures, mildly dilated left atrium, mild MR, mild TR. Large liver cyst (6.9x7.8cm) noted. Consider dedicated imaging, if clinically indicated.   Stress Testing:  Lexiscan 09/11/2019: Nondiagnostic ECG stress. The baseline blood pressure was 80/54 mmHg and decreased to 78/54 mmHg at peak infusion, which is a physiologic response to Intravenous Lexiscan. Patient asymptomatic. Resting EKG demonstrated normal sinus rhythm. Non-specific ST-T abnormality. Peak EKG revealed no significant ST-T change from baseline abnormality. Myocardial perfusion is normal. Stress LV EF: 52%. No previous exam available for comparison. Low risk study.    Heart Catheterization: 11/28/2019 by Dr. Joya Gaskins Patwardhan: Normal epicardial coronary arteries.  Normal LVEDP.   Carotid Duplex: 76/72/0947: Peak systolic velocities in the right bifurcation, internal, external and common carotid arteries are within normal limits. Minimal stenosis in the left internal carotid artery (minimal) with homogenous  plaque.  Right vertebral artery flow is not visualized. Antegrade left vertebral artery flow.  14 - day [facility]. Enrollment Period: 10/18/2019-10/31/2019 Predominant rhythm normal sinus with heart rate ranging from 45 - 121 bpm, average heart rate 69 bpm. No pauses greater than or equal to 2.5 seconds. No atrial fibrillation detected during the monitoring period.   Total supraventricular ectopic burden <1% (323 supraventricular ectopy, 16 supraventricular runs). Longest run was 22 beats in duration at 162 bpm. The fastest run was 3 beats in duration at 171 bpm. Total ventricular ectopic burden <1% (6925 ventricular ectopy, 0 ventricular pairs and 0 ventricular runs). Heart rate < 60 bpm for 27.8% of the recording. Heart rate > 100 bpm for 3.4% of the recording. No Auto triggered events.   21 Patient triggered events reviewed, not associated with any significant arrhythmia (baseline artifact in many tracings).   Recent labs: 09/08/2019: Glucose 110, BUN/Cr 130.93 EGFR >60. Na/K 140/4.1.  H/H 15.6/48.8.   LABORATORY DATA:    Latest Ref Rng & Units 08/06/2020    4:02 PM 11/28/2019   12:49 PM 09/08/2019   10:01 AM  CBC  WBC 4.0 - 10.5 K/uL 9.3  9.1  7.0   Hemoglobin 13.0 - 17.0 g/dL 15.8  13.9  15.6   Hematocrit 39.0 - 52.0 % 47.8  42.3  48.8   Platelets 150.0 - 400.0 K/uL 164.0  C 193  178     C Corrected result       Latest Ref Rng & Units 11/24/2019    8:07 AM 10/05/2019    8:08 AM 09/25/2019    8:13 AM  CMP  Glucose 65 - 99 mg/dL 105  115  121   BUN 8 - 27 mg/dL '16  18  17   ' Creatinine 0.76 - 1.27 mg/dL 0.96  1.06  1.16   Sodium 134 - 144 mmol/L 141  140  141   Potassium 3.5 - 5.2 mmol/L 4.6  4.4  3.6   Chloride 96 - 106 mmol/L 104  100  100   CO2 20 - 29 mmol/L '18  22  24   ' Calcium 8.6 - 10.2 mg/dL 9.9  9.9  9.7     Lipid Panel     Component Value Date/Time   CHOL 139 09/25/2019 0813   TRIG 122 09/25/2019 0813   HDL 45 09/25/2019 0813    LDLCALC 72 09/25/2019 0813   LABVLDL 22 09/25/2019 0813   FINAL MEDICATION LIST END OF ENCOUNTER: No orders of the defined types were placed in this encounter.  Medications Discontinued During This Encounter  Medication Reason   amLODipine (NORVASC) 10 MG tablet    losartan (COZAAR) 25 MG tablet        Current Outpatient Medications:    albuterol (VENTOLIN HFA) 108 (90 Base) MCG/ACT inhaler, Inhale 2 puffs into the lungs every 6 (six) hours as needed for wheezing or shortness of breath., Disp: , Rfl:    aspirin 81 MG chewable tablet, Chew 81 mg by mouth daily., Disp: , Rfl:    levothyroxine (SYNTHROID) 137 MCG tablet, Take 137 mcg by mouth daily before breakfast. Was told on 11/06/19 by PCP not to take, Disp: , Rfl:    lovastatin (MEVACOR) 40 MG tablet, Take 40 mg by mouth daily. , Disp: , Rfl:    magnesium oxide (MAG-OX) 400 MG tablet, Take 400 mg by mouth daily., Disp: , Rfl:    meloxicam (MOBIC) 7.5 MG tablet, Take 7.5 mg by mouth daily as needed for pain. , Disp: , Rfl:    nitroGLYCERIN (NITROSTAT) 0.4 MG SL tablet, Place 1 tablet (0.4 mg total) under the tongue every 5 (five) minutes as needed for chest pain., Disp: 30 tablet, Rfl: 3   omeprazole (PRILOSEC) 40 MG capsule, Take 20 mg by mouth daily before breakfast., Disp: , Rfl:   IMPRESSION:    ICD-10-CM   1. Orthostatic hypotension  I95.1 EKG 12-Lead    2. Dyspnea on exertion  R06.09     3. Essential hypertension, benign  I10     4. Mixed hyperlipidemia  E78.2     5. Former smoker  Z87.891        RECOMMENDATIONS: GURNEY BALTHAZOR is a 86 y.o. male whose past medical history and cardiovascular risk factors include: Hypertension, hyperlipidemia, advanced age, former smoker.  About 2 weeks ago patient went to his PCP and was noted to have orthostatic hypotension.  Since then he has stopped amlodipine and losartan and his symptoms have improved.  However, he was referred to cardiology for further evaluation and  management.  Patient states that he tries to keep himself well-hydrated, eating 3 heart healthy balanced meals, and avoids extreme temperatures.  Currently he is asymptomatic.  Office blood pressures are within acceptable range.  I have asked him to keep a log of his blood pressures at home would focus on keeping his SBP around 140 mmHg as he has had episodes of orthostasis in the past.  Most recent ischemic work-up reviewed and noted above for further reference.  We will hold off on additional testing for now.   Orders Placed This Encounter  Procedures   EKG 12-Lead    --Continue cardiac medications as reconciled in final medication list. --Return in about 6 months (around 10/07/2022) for Follow up, Dyspnea, orthostatic hypotension. Or sooner if needed. --Continue follow-up with your primary care physician regarding the management of your other chronic comorbid conditions.  Patient's questions and concerns were addressed to his satisfaction. He voices understanding of the instructions provided during this encounter.   This note was created using a voice recognition software as a result there may be grammatical errors inadvertently enclosed that do not reflect the nature of this encounter. Every attempt is made to correct such errors.  Rex Kras, [place], [facility]  Pager: [phone] Office: [phone]

## 2022-04-10 ENCOUNTER — Telehealth: Payer: Self-pay

## 2022-04-10 NOTE — Telephone Encounter (Signed)
Patient came in, I checked his BP it was 147/73. Patient stated that he has been getting BP's in the upper 180's and I advised him to keep a log of BP's and food intake until Tuesday and to go ahead and make appointment to see you next Tues, since he started this past Tues documenting his BP's. If you need me to change anything, let me know.

## 2022-04-10 NOTE — Telephone Encounter (Signed)
Please call pt regarding BP issues and medication

## 2022-04-10 NOTE — Telephone Encounter (Signed)
Thank you, please have him call the on call over the week if blood pressure consistently remains elevated >150/90

## 2022-04-10 NOTE — Telephone Encounter (Signed)
Patient aware.

## 2022-04-14 ENCOUNTER — Ambulatory Visit: Payer: PPO

## 2022-04-14 VITALS — BP 143/82 | HR 72 | Temp 97.6°F | Resp 16 | Ht 72.0 in | Wt 176.0 lb

## 2022-04-14 DIAGNOSIS — I951 Orthostatic hypotension: Secondary | ICD-10-CM | POA: Diagnosis not present

## 2022-04-14 DIAGNOSIS — I10 Essential (primary) hypertension: Secondary | ICD-10-CM | POA: Diagnosis not present

## 2022-04-14 MED ORDER — LOSARTAN POTASSIUM 25 MG PO TABS: 12.5000 mg | ORAL_TABLET | Freq: Every day | ORAL | 3 refills | Status: DC

## 2022-04-14 NOTE — Progress Notes (Addendum)
Joseph [name] Date of Birth: [birth year] MRN: [id number] Primary Care Provider:Pharr, Thayer Jew, MD  Date: 04/14/22 Last Office Visit: 11/11/2021  Chief Complaint  Patient presents with   Hypertension    HPI  Joseph [name] is a 87 y.o.  male whose past medical history and cardiovascular risk factors include: Hypertension, hyperlipidemia, advanced age, former smoker, asthma.  Has been followed by the practice for dyspnea on exertion for which she is undergone a very thorough cardiovascular work-up.  Symptoms are likely secondary to underlying asthma.  Patient presents today for sooner office visit due to elevated home blood pressure readings.  Home blood pressure readings have been 160-180s/80s the past 1 week. Approximately 3 weeks ago he was seen by his PCP for new onset orthostatic hypotension.  At that time losartan and amlodipine were discontinued. Since the discontinuation of antihypertensive medications he is no longer experiencing lightheaded and dizziness.  Patient denies any near-syncope or syncope.  He stays well-hydrated, no loss of blood, consuming 3 balanced heart healthy meals.  ALLERGIES: Allergies  Allergen Reactions   Ciprofloxacin Anaphylaxis and Other (See Comments)    Caused sores & blisters and a trip to the hospital- was also taking Aldara at the same time, however   Ciprocin-Fluocin-Procin [Fluocinolone Acetonide] Other (See Comments)    Blisters and sores   Fluocinolone Acetonide Other (See Comments)    Reaction not recalled   Quinolones Other (See Comments)    Pt had to go to hospital (sores and blisters)   Statins Other (See Comments)    Made the legs hurt   Imiquimod Other (See Comments)    (Aldara) Caused sores that landed him in the hospital for 3 days- was taking Cipro at the same time, however   Penicillins Itching, Rash and Other (See Comments)    Has patient had a PCN reaction causing immediate rash, facial/tongue/throat swelling, SOB or  lightheadedness with hypotension: Yes Has patient had a PCN reaction causing severe rash involving mucus membranes or skin necrosis: No Has patient had a PCN reaction that required hospitalization: No Has patient had a PCN reaction occurring within the last 10 years: No If all of the above answers are "NO", then may proceed with Cephalosporin use.      MEDICATION LIST PRIOR TO VISIT: Current Outpatient Medications on File Prior to Visit  Medication Sig Dispense Refill   albuterol (VENTOLIN HFA) 108 (90 Base) MCG/ACT inhaler Inhale 2 puffs into the lungs every 6 (six) hours as needed for wheezing or shortness of breath.     aspirin 81 MG chewable tablet Chew 81 mg by mouth daily.     levothyroxine (SYNTHROID) 137 MCG tablet Take 137 mcg by mouth daily before breakfast. Was told on 11/06/19 by PCP not to take     lovastatin (MEVACOR) 40 MG tablet Take 40 mg by mouth daily.      magnesium oxide (MAG-OX) 400 MG tablet Take 400 mg by mouth daily.     meloxicam (MOBIC) 7.5 MG tablet Take 7.5 mg by mouth daily as needed for pain.      omeprazole (PRILOSEC) 40 MG capsule Take 20 mg by mouth daily before breakfast.     nitroGLYCERIN (NITROSTAT) 0.4 MG SL tablet Place 1 tablet (0.4 mg total) under the tongue every 5 (five) minutes as needed for chest pain. 30 tablet 3   No current facility-administered medications on file prior to visit.    PAST MEDICAL HISTORY: Past Medical History:  Diagnosis Date  Arthritis    COPD (chronic obstructive pulmonary disease) (HCC)    Dyspnea    on exertion   Hyperlipidemia    Hypertension    Macular pucker, right eye 01/11/2020   The nature of macular pucker (epiretinal membrane ERM) was discussed with the patient as well as threshold criteria for vitrectomy surgery. I explained that in rare cases another surgery is needed to actually remove a second wrinkle should it regrow.  Most often, the epiretinal membrane and underlying wrinkled internal limiting membrane  are removed with the first surgery, to accomplish the goals.    Pneumothorax, spontaneous, tension 1960   SOB (shortness of breath) on exertion     PAST SURGICAL HISTORY: Past Surgical History:  Procedure Laterality Date   EYE SURGERY     bilateral cataract with lens implants   LEFT HEART CATH AND CORONARY ANGIOGRAPHY N/A 11/28/2019   Procedure: LEFT HEART CATH AND CORONARY ANGIOGRAPHY;  Surgeon: Nigel Mormon, MD;  Location: [facility];  Service: Cardiovascular;  Laterality: N/A;   LUNG SURGERY     40 years ago   TOE SURGERY     TOTAL KNEE ARTHROPLASTY Left 10/06/2017   Procedure: LEFT TOTAL KNEE ARTHROPLASTY;  Surgeon: Susa Day, MD;  Location: [facility];  Service: Orthopedics;  Laterality: Left;  Adductor Block    FAMILY HISTORY: No family history of premature coronary disease or sudden cardiac death.   SOCIAL HISTORY:  The patient  reports that he quit smoking about 63 years ago. His smoking use included cigarettes. He has a 5.00 pack-year smoking history. He has never used smokeless tobacco. He reports that he does not drink alcohol and does not use drugs.  Review of Systems  Cardiovascular:  Negative for chest pain, dyspnea on exertion, leg swelling, orthopnea, palpitations, paroxysmal nocturnal dyspnea and syncope.  Respiratory:  Positive for shortness of breath (chronic and stable).     PHYSICAL EXAM:    04/14/2022    9:57 AM 04/07/2022   12:51 PM 11/11/2021    2:08 PM  Vitals with BMI  Height '6\' 0"'$  '6\' 0"'$  '6\' 0"'$   Weight 176 lbs 177 lbs 10 oz 190 lbs  BMI 23.86 29.56 21.30  Systolic 865 784 696  Diastolic 82 70 57  Pulse 72 84 81   Orthostatic VS for the past 72 hrs (Last 3 readings):  Patient Position BP Location Cuff Size  04/14/22 0957 Sitting Left Arm Large   Physical Exam  Constitutional:  Age appropriate, hemodynamically stable.   Neck: No JVD present.  Cardiovascular: Normal rate, regular rhythm, S1 normal, S2 normal, intact distal pulses  and normal pulses. Exam reveals no gallop, no S3 and no S4.  No murmur heard. Pulmonary/Chest: Effort normal and breath sounds normal. No stridor. He has no wheezes. He has no rales.  Musculoskeletal:        General: No edema.  Neurological: He is alert and oriented to person, place, and time. He has intact cranial nerves (2-12).  Skin: Skin is warm and moist.   EKG: 04/07/2022: Normal sinus rhythm, 83 bpm, without underlying ischemia or injury pattern.   Echocardiogram: 09/2019: LVEF 55-60%, mild LVH, normal diastolic filling pressures, normal left atrial pressures, mildly dilated left atrium, mild MR, mild TR. Large liver cyst (6.9x7.8cm) noted. Consider dedicated imaging, if clinically indicated.   Stress Testing:  Lexiscan 09/11/2019: Nondiagnostic ECG stress. The baseline blood pressure was 80/54 mmHg and decreased to 78/54 mmHg at peak infusion, which is a physiologic response  to Intravenous Lexiscan. Patient asymptomatic. Resting EKG demonstrated normal sinus rhythm. Non-specific ST-T abnormality. Peak EKG revealed no significant ST-T change from baseline abnormality. Myocardial perfusion is normal. Stress LV EF: 52%. No previous exam available for comparison. Low risk study.    Heart Catheterization: 11/28/2019 by Dr. Joya Gaskins Patwardhan: Normal epicardial coronary arteries.  Normal LVEDP.   Carotid Duplex: 83/38/2505: Peak systolic velocities in the right bifurcation, internal, external and common carotid arteries are within normal limits. Minimal stenosis in the left internal carotid artery (minimal) with homogenous plaque.  Right vertebral artery flow is not visualized. Antegrade left vertebral artery flow.  14 - day [facility] Tele[facility]. Enrollment Period: 10/18/2019-10/31/2019 Predominant rhythm normal sinus with heart rate ranging from 45 - 121 bpm, average heart rate 69 bpm. No pauses greater than or equal to 2.5 seconds. No atrial fibrillation detected during the  monitoring period.   Total supraventricular ectopic burden <1% (323 supraventricular ectopy, 16 supraventricular runs). Longest run was 22 beats in duration at 162 bpm. The fastest run was 3 beats in duration at 171 bpm. Total ventricular ectopic burden <1% (6925 ventricular ectopy, 0 ventricular pairs and 0 ventricular runs). Heart rate < 60 bpm for 27.8% of the recording. Heart rate > 100 bpm for 3.4% of the recording. No Auto triggered events.   21 Patient triggered events reviewed, not associated with any significant arrhythmia (baseline artifact in many tracings).   Recent labs: 01/28/2022: Cholesterol 153, triglycerides 154, HDL 45, LDL 77  Hemoglobin A1c 5.7%  TSH 5.96  Hemoglobin 14.1, hematocrit 42.7, MCV 88.8, platelet count 205  Sodium 141, potassium 4.2, BUN 14, creatinine 1.18, glucose 109  LABORATORY DATA:  FINAL MEDICATION LIST END OF ENCOUNTER: Meds ordered this encounter  Medications   losartan (COZAAR) 25 MG tablet    Sig: Take 0.5 tablets (12.5 mg total) by mouth daily.    Dispense:  90 tablet    Refill:  3    Order Specific Question:   Supervising Provider    Answer:   Adrian Prows [[id number]]    There are no discontinued medications.   Current Outpatient Medications:    albuterol (VENTOLIN HFA) 108 (90 Base) MCG/ACT inhaler, Inhale 2 puffs into the lungs every 6 (six) hours as needed for wheezing or shortness of breath., Disp: , Rfl:    aspirin 81 MG chewable tablet, Chew 81 mg by mouth daily., Disp: , Rfl:    levothyroxine (SYNTHROID) 137 MCG tablet, Take 137 mcg by mouth daily before breakfast. Was told on 11/06/19 by PCP not to take, Disp: , Rfl:    losartan (COZAAR) 25 MG tablet, Take 0.5 tablets (12.5 mg total) by mouth daily., Disp: 90 tablet, Rfl: 3   lovastatin (MEVACOR) 40 MG tablet, Take 40 mg by mouth daily. , Disp: , Rfl:    magnesium oxide (MAG-OX) 400 MG tablet, Take 400 mg by mouth daily., Disp: , Rfl:    meloxicam (MOBIC) 7.5 MG tablet, Take 7.5 mg  by mouth daily as needed for pain. , Disp: , Rfl:    omeprazole (PRILOSEC) 40 MG capsule, Take 20 mg by mouth daily before breakfast., Disp: , Rfl:    nitroGLYCERIN (NITROSTAT) 0.4 MG SL tablet, Place 1 tablet (0.4 mg total) under the tongue every 5 (five) minutes as needed for chest pain., Disp: 30 tablet, Rfl: 3  IMPRESSION:    ICD-10-CM   1. Essential hypertension, benign  I10     2. Orthostatic hypotension  I95.1  RECOMMENDATIONS:  Essential hypertension, benign Orthostatic hypotension Given recent elevation in blood pressure with home readings we will restart losartan 12.5 mg daily. We did discuss RPM blood pressure monitoring, he does not want to do this at this time due to having a co-pay. Advised patient to bring in home blood pressure monitor to ensure home readings are accurate.  Until then, I have advised him to keep a log of home blood pressure readings with goal systolic BP around 182 mmHg has had episodes of orthostasis in the past. Currently he is asymptomatic. Recent lab work reviewed.  Follow-up in 4 weeks or sooner if needed.     Ernst Spell, [place] Pager: [phone] Office: [phone]

## 2022-04-20 DIAGNOSIS — M25511 Pain in right shoulder: Secondary | ICD-10-CM | POA: Diagnosis not present

## 2022-04-26 ENCOUNTER — Other Ambulatory Visit: Payer: Self-pay

## 2022-04-26 ENCOUNTER — Emergency Department (HOSPITAL_COMMUNITY)
Admission: EM | Admit: 2022-04-26 | Discharge: 2022-04-27 | Disposition: A | Discharge status: Home / Self Care | Payer: PPO | Attending: Emergency Medicine | Admitting: Emergency Medicine

## 2022-04-26 ENCOUNTER — Encounter (HOSPITAL_COMMUNITY): Payer: Self-pay | Admitting: *Deleted

## 2022-04-26 DIAGNOSIS — R030 Elevated blood-pressure reading, without diagnosis of hypertension: Secondary | ICD-10-CM

## 2022-04-26 DIAGNOSIS — I10 Essential (primary) hypertension: Secondary | ICD-10-CM | POA: Insufficient documentation

## 2022-04-26 NOTE — ED Triage Notes (Signed)
Pt states he has been logging his BP for his doctor, his bp at home was 205/90 around 2200 tonight. Pt says that he was taken off his BP medications and they have slowly been adding back his medication regimen.  At the time he was resting watching football on TV. Pt denies that he has had any chest pain, headache, palpitations, nausea, or dizziness.

## 2022-04-27 ENCOUNTER — Ambulatory Visit: Payer: PPO

## 2022-04-27 VITALS — BP 141/75 | HR 79 | Temp 98.1°F | Resp 16 | Ht 72.0 in | Wt 179.0 lb

## 2022-04-27 DIAGNOSIS — I951 Orthostatic hypotension: Secondary | ICD-10-CM | POA: Insufficient documentation

## 2022-04-27 DIAGNOSIS — I10 Essential (primary) hypertension: Secondary | ICD-10-CM | POA: Diagnosis not present

## 2022-04-27 DIAGNOSIS — R399 Unspecified symptoms and signs involving the genitourinary system: Secondary | ICD-10-CM | POA: Diagnosis not present

## 2022-04-27 MED ORDER — LOSARTAN POTASSIUM 25 MG PO TABS: 25.0000 mg | ORAL_TABLET | Freq: Every day | ORAL | 3 refills | Status: DC

## 2022-04-27 MED ORDER — HYDRALAZINE HCL 10 MG PO TABS: 10.0000 mg | ORAL_TABLET | Freq: Three times a day (TID) | ORAL | 3 refills | Status: DC

## 2022-04-27 NOTE — ED Provider Notes (Signed)
[facility] Emergency Department Provider Note MRN:  [id number]  Arrival date & time: 04/27/22     Chief Complaint   Hypertension   History of Present Illness   Joseph [name] is a 87 y.o. year-old male presents to the ED with chief complaint of elevated blood pressures.  He states that he is currently getting his BP medications sorted out with his doctor and is actively being monitored for this.  He states that tonight his BP was 643 systolic and this prompted him to come to the ER.  BPs were 160s in triage.  Patient denies having symptoms, denies headaches, dizziness, numbness, weakness, vision/speech changes, chest pain, or SOB.  History provided by patient.   Review of Systems  Pertinent positive and negative review of systems noted in HPI.    Physical Exam   Vitals:   04/27/22 0420 04/27/22 0428  BP: (!) 154/76 (!) 154/76  Pulse: 76 74  Resp:  16  Temp:  97.6 F (36.4 C)  SpO2: 97% 98%    CONSTITUTIONAL:  well-appearing, NAD NEURO:  Alert and oriented x 3, CN 3-12 grossly intact EYES:  eyes equal and reactive ENT/NECK:  Supple, no stridor  CARDIO:  normal rate, regular rhythm, appears well-perfused  PULM:  No respiratory distress, CTAB GI/GU:  non-distended,  MSK/SPINE:  No gross deformities, no edema, moves all extremities  SKIN:  no rash, atraumatic   *Additional and/or pertinent findings included in MDM below  Diagnostic and Interventional Summary    EKG Interpretation  Date/Time:  Sunday April 26 2022 23:35:10 EDT Ventricular Rate:  76 PR Interval:  158 QRS Duration: 88 QT Interval:  374 QTC Calculation: 420 R Axis:   74 Text Interpretation: Normal sinus rhythm Normal ECG Confirmed by Ripley Fraise [phone]) on 04/27/2022 4:04:45 AM       Labs Reviewed - No data to display  No orders to display    Medications - No data to display   Procedures  /  Critical Care Procedures  ED Course and Medical Decision Making   I have reviewed the triage vital signs, the nursing notes, and pertinent available records from the EMR.  Social Determinants Affecting Complexity of Care: Patient has no clinically significant social determinants affecting this chief complaint..   ED Course:    Medical Decision Making Patient here with elevated blood pressure reading.  Currently being monitored and managed by his PCP.  Has had some recent changes in his blood pressure medications.  He denies having any symptoms.  She states that the high number made him nervous and so he came to the emergency department for evaluation.  Blood pressure has improved while in the ED.  Given that he is actively being managed by his PCP, not going to make any blood pressure medication changes.  He is advised to follow-up with his doctor.  Problems Addressed: Elevated blood pressure reading: chronic illness or injury     Consultants: No consultations were needed in caring for this patient.   Treatment and Plan: Emergency department workup does not suggest an emergent condition requiring admission or immediate intervention beyond  what has been performed at this time. The patient is safe for discharge and has  been instructed to return immediately for worsening symptoms, change in  symptoms or any other concerns    Final Clinical Impressions(s) / ED Diagnoses     ICD-10-CM   1. Elevated blood pressure reading  R03.0       ED  Discharge Orders     None         Discharge Instructions Discussed with and Provided to Patient:     Discharge Instructions      Please continue taking your blood pressure medications.  Please follow-up with your doctor.       Montine Circle, PA-C 04/27/22 1117    Ripley Fraise, MD 04/28/22 [phone]

## 2022-04-27 NOTE — Progress Notes (Signed)
Joseph [name] Date of Birth: 03-07-1935 MRN: [id number] Primary Care Provider:Pharr, Thayer Jew, MD  Date: 04/27/22 Last Office Visit: 11/11/2021  Chief Complaint  Patient presents with   Hypertension   Medication Management    HPI  Joseph [name] is a 87 y.o.  male whose past medical history and cardiovascular risk factors include: Hypertension, hyperlipidemia, advanced age, former smoker, asthma.  Has been followed by the practice for dyspnea on exertion for which she is undergone a very thorough cardiovascular work-up.  Symptoms are likely secondary to underlying asthma.  Patient presents today for complaints of elevated home blood pressure readings. He presented to the Emergency Department this morning with systolic blood pressure of 200. He was advised to follow-up with cardiology.  Home blood pressure readings have been 150-180s/80s since last visit at which time he was started on Losartan 12.54m daily. He has been taking losartan 12.517mtwice daily instead of daily. Of note, approximately 4 weeks ago he was seen by his PCP for new onset orthostatic hypotension.  At that time losartan and amlodipine were discontinued.   Patient denies any near-syncope or syncope.  He stays well-hydrated, no loss of blood, consuming 3 balanced heart healthy meals.  ALLERGIES: Allergies  Allergen Reactions   Ciprofloxacin Anaphylaxis and Other (See Comments)    Caused sores & blisters and a trip to the hospital- was also taking Aldara at the same time, however   Ciprocin-Fluocin-Procin [Fluocinolone Acetonide] Other (See Comments)    Blisters and sores   Fluocinolone Acetonide Other (See Comments)    Reaction not recalled   Quinolones Other (See Comments)    Pt had to go to hospital (sores and blisters)   Statins Other (See Comments)    Made the legs hurt   Imiquimod Other (See Comments)    (Aldara) Caused sores that landed him in the hospital for 3 days- was taking Cipro at the same time,  however   Penicillins Itching, Rash and Other (See Comments)    Has patient had a PCN reaction causing immediate rash, facial/tongue/throat swelling, SOB or lightheadedness with hypotension: Yes Has patient had a PCN reaction causing severe rash involving mucus membranes or skin necrosis: No Has patient had a PCN reaction that required hospitalization: No Has patient had a PCN reaction occurring within the last 10 years: No If all of the above answers are "NO", then may proceed with Cephalosporin use.     MEDICATION LIST PRIOR TO VISIT: Current Outpatient Medications on File Prior to Visit  Medication Sig Dispense Refill   albuterol (VENTOLIN HFA) 108 (90 Base) MCG/ACT inhaler Inhale 2 puffs into the lungs every 6 (six) hours as needed for wheezing or shortness of breath.     aspirin 81 MG chewable tablet Chew 81 mg by mouth daily.     Calcium-Vitamin D-Vitamin K (CALCIUM + D) [phone] MG-UNT-MCG CHEW as directed Orally once a day     dutasteride (AVODART) 0.5 MG capsule Take 0.5 mg by mouth daily.     levothyroxine (SYNTHROID) 137 MCG tablet Take 137 mcg by mouth daily before breakfast. Was told on 11/06/19 by PCP not to take     lovastatin (MEVACOR) 40 MG tablet Take 40 mg by mouth daily.      magnesium oxide (MAG-OX) 400 MG tablet Take 400 mg by mouth daily.     meloxicam (MOBIC) 7.5 MG tablet Take 7.5 mg by mouth daily as needed for pain.      omeprazole (PRILOSEC) 40 MG capsule  Take 20 mg by mouth daily before breakfast.     nitroGLYCERIN (NITROSTAT) 0.4 MG SL tablet Place 1 tablet (0.4 mg total) under the tongue every 5 (five) minutes as needed for chest pain. 30 tablet 3   No current facility-administered medications on file prior to visit.    PAST MEDICAL HISTORY: Past Medical History:  Diagnosis Date   Arthritis    COPD (chronic obstructive pulmonary disease) ([place])    Dyspnea    on exertion   Hyperlipidemia    Hypertension    Macular pucker, right eye 01/11/2020   The  nature of macular pucker (epiretinal membrane ERM) was discussed with the patient as well as threshold criteria for vitrectomy surgery. I explained that in rare cases another surgery is needed to actually remove a second wrinkle should it regrow.  Most often, the epiretinal membrane and underlying wrinkled internal limiting membrane are removed with the first surgery, to accomplish the goals.    Pneumothorax, spontaneous, tension 1960   SOB (shortness of breath) on exertion     PAST SURGICAL HISTORY: Past Surgical History:  Procedure Laterality Date   EYE SURGERY     bilateral cataract with lens implants   LEFT HEART CATH AND CORONARY ANGIOGRAPHY N/A 11/28/2019   Procedure: LEFT HEART CATH AND CORONARY ANGIOGRAPHY;  Surgeon: Nigel Mormon, MD;  Location: [facility] [facility];  Service: Cardiovascular;  Laterality: N/A;   LUNG SURGERY     40 years ago   TOE SURGERY     TOTAL KNEE ARTHROPLASTY Left 10/06/2017   Procedure: LEFT TOTAL KNEE ARTHROPLASTY;  Surgeon: Susa Day, MD;  Location: [facility];  Service: Orthopedics;  Laterality: Left;  Adductor Block    FAMILY HISTORY: No family history of premature coronary disease or sudden cardiac death.   SOCIAL HISTORY:  The patient  reports that he quit smoking about 63 years ago. His smoking use included cigarettes. He has a 5.00 pack-year smoking history. He has never used smokeless tobacco. He reports that he does not drink alcohol and does not use drugs.  Review of Systems  Cardiovascular:  Negative for chest pain, dyspnea on exertion, leg swelling, orthopnea, palpitations, paroxysmal nocturnal dyspnea and syncope.  Respiratory:  Positive for shortness of breath (chronic and stable).     PHYSICAL EXAM:    04/27/2022    2:15 PM 04/27/2022    4:28 AM 04/27/2022    4:20 AM  Vitals with BMI  Height $Remov'6\' 0"'yWkbof$     Weight 179 lbs    BMI 10.27    Systolic 253 664 403  Diastolic 75 76 76  Pulse 79 74 76   Orthostatic VS for the past 72  hrs (Last 3 readings):  Patient Position BP Location Cuff Size  04/27/22 1415 Sitting Left Arm Normal   Physical Exam  Constitutional:  Age appropriate, hemodynamically stable.   Neck: No JVD present.  Cardiovascular: Normal rate, regular rhythm, S1 normal, S2 normal, intact distal pulses and normal pulses. Exam reveals no gallop, no S3 and no S4.  No murmur heard. Pulmonary/Chest: Effort normal and breath sounds normal. No stridor. He has no wheezes. He has no rales.  Musculoskeletal:        General: No edema.  Neurological: He is alert and oriented to person, place, and time. He has intact cranial nerves (2-12).  Skin: Skin is warm and moist.   EKG: 04/07/2022: Normal sinus rhythm, 83 bpm, without underlying ischemia or injury pattern.   Echocardiogram: 09/2019: LVEF 55-60%,  mild LVH, normal diastolic filling pressures, normal left atrial pressures, mildly dilated left atrium, mild MR, mild TR. Large liver cyst (6.9x7.8cm) noted. Consider dedicated imaging, if clinically indicated.   Stress Testing:  Lexiscan 09/11/2019: Nondiagnostic ECG stress. The baseline blood pressure was 80/54 mmHg and decreased to 78/54 mmHg at peak infusion, which is a physiologic response to Intravenous Lexiscan. Patient asymptomatic. Resting EKG demonstrated normal sinus rhythm. Non-specific ST-T abnormality. Peak EKG revealed no significant ST-T change from baseline abnormality. Myocardial perfusion is normal. Stress LV EF: 52%. No previous exam available for comparison. Low risk study.    Heart Catheterization: 11/28/2019 by Dr. Joya Gaskins Patwardhan: Normal epicardial coronary arteries.  Normal LVEDP.   Carotid Duplex: 09/47/0962: Peak systolic velocities in the right bifurcation, internal, external and common carotid arteries are within normal limits. Minimal stenosis in the left internal carotid artery (minimal) with homogenous plaque.  Right vertebral artery flow is not visualized. Antegrade left vertebral  artery flow.  14 - day [facility]iac Ambulatory Telemetry. Enrollment Period: 10/18/2019-10/31/2019 Predominant rhythm normal sinus with heart rate ranging from 45 - 121 bpm, average heart rate 69 bpm. No pauses greater than or equal to 2.5 seconds. No atrial fibrillation detected during the monitoring period.   Total supraventricular ectopic burden <1% (323 supraventricular ectopy, 16 supraventricular runs). Longest run was 22 beats in duration at 162 bpm. The fastest run was 3 beats in duration at 171 bpm. Total ventricular ectopic burden <1% (6925 ventricular ectopy, 0 ventricular pairs and 0 ventricular runs). Heart rate < 60 bpm for 27.8% of the recording. Heart rate > 100 bpm for 3.4% of the recording. No Auto triggered events.   21 Patient triggered events reviewed, not associated with any significant arrhythmia (baseline artifact in many tracings).   Recent labs: 01/28/2022: Cholesterol 153, triglycerides 154, HDL 45, LDL 77  Hemoglobin A1c 5.7%  TSH 5.96  Hemoglobin 14.1, hematocrit 42.7, MCV 88.8, platelet count 205  Sodium 141, potassium 4.2, BUN 14, creatinine 1.18, glucose 109  LABORATORY DATA:  FINAL MEDICATION LIST END OF ENCOUNTER: Meds ordered this encounter  Medications   hydrALAZINE (APRESOLINE) 10 MG tablet    Sig: Take 1 tablet (10 mg total) by mouth 3 (three) times daily.    Dispense:  90 tablet    Refill:  3    Order Specific Question:   Supervising Provider    Answer:   Adrian Prows [[id number]]   losartan (COZAAR) 25 MG tablet    Sig: Take 1 tablet (25 mg total) by mouth daily.    Dispense:  90 tablet    Refill:  3    Order Specific Question:   Supervising Provider    Answer:   Adrian Prows [[id number]]    Medications Discontinued During This Encounter  Medication Reason   losartan (COZAAR) 25 MG tablet      Current Outpatient Medications:    albuterol (VENTOLIN HFA) 108 (90 Base) MCG/ACT inhaler, Inhale 2 puffs into the lungs every 6 (six) hours as needed  for wheezing or shortness of breath., Disp: , Rfl:    aspirin 81 MG chewable tablet, Chew 81 mg by mouth daily., Disp: , Rfl:    Calcium-Vitamin D-Vitamin K (CALCIUM + D) [id number] MG-UNT-MCG CHEW, as directed Orally once a day, Disp: , Rfl:    dutasteride (AVODART) 0.5 MG capsule, Take 0.5 mg by mouth daily., Disp: , Rfl:    hydrALAZINE (APRESOLINE) 10 MG tablet, Take 1 tablet (10 mg total) by mouth 3 (three) times daily.,  Disp: 90 tablet, Rfl: 3   levothyroxine (SYNTHROID) 137 MCG tablet, Take 137 mcg by mouth daily before breakfast. Was told on 11/06/19 by PCP not to take, Disp: , Rfl:    losartan (COZAAR) 25 MG tablet, Take 1 tablet (25 mg total) by mouth daily., Disp: 90 tablet, Rfl: 3   lovastatin (MEVACOR) 40 MG tablet, Take 40 mg by mouth daily. , Disp: , Rfl:    magnesium oxide (MAG-OX) 400 MG tablet, Take 400 mg by mouth daily., Disp: , Rfl:    meloxicam (MOBIC) 7.5 MG tablet, Take 7.5 mg by mouth daily as needed for pain. , Disp: , Rfl:    omeprazole (PRILOSEC) 40 MG capsule, Take 20 mg by mouth daily before breakfast., Disp: , Rfl:    nitroGLYCERIN (NITROSTAT) 0.4 MG SL tablet, Place 1 tablet (0.4 mg total) under the tongue every 5 (five) minutes as needed for chest pain., Disp: 30 tablet, Rfl: 3  IMPRESSION:    ICD-10-CM   1. Primary hypertension  I10 CMP14+EGFR    2. Orthostatic hypotension  I95.1      RECOMMENDATIONS:  Essential hypertension, benign Orthostatic hypotension Given recent elevation in blood pressure with home readings we will start Losartan 31m daily and Hydralazine 131mthree times daily. I have advised him to keep a log of home blood pressure readings with goal systolic BP around 14322mHg given episodes of orthostasis in the past. Currently he is asymptomatic. Recent lab work reviewed.  Follow-up in 2 weeks or sooner if needed.    BrErnst SpellAGVirginiaager: [phone]: [phone]

## 2022-04-27 NOTE — Discharge Instructions (Signed)
Please continue taking your blood pressure medications.  Please follow-up with your doctor.

## 2022-05-01 NOTE — Progress Notes (Signed)
Cardiology Office Note:    Date:  05/15/2022   ID:  Joseph [name], DOB Nov 09, 1934, MRN [id number]  PCP:  Deland Pretty, [place] Providers Cardiologist:  None     Referring MD: Deland Pretty, MD   Chief Complaint  Patient presents with   Hypertension   Shortness of Breath    History of Present Illness:    Joseph [name] is a 87 y.o. male seen at the request of Dr Shelia Media to re-establish cardiac care. He has a history of hypertension, hyperlipidemia, and hypothyroidism. I saw him remotely in 2013. He has since been followed by Dr Terri Skains with [facility] cardiology. He complained of dyspnea. Extensive cardiac evaluation including Echo, stress test and cardiac cath were normal. Event monitor OK. Was having orthostatic hypotension which improved with stopping BP medication. He is now back on low dose losartan and hydralazine. Also had pulmonary evaluation. PFTs looked good. Was tried on inhalers but never saw any benefit. He is concerned about his BP. Was getting high readings at home. Changed to a new monitor and still getting readings of 916-384 systolic.   Past Medical History:  Diagnosis Date   Arthritis    COPD (chronic obstructive pulmonary disease) ([place])    Dyspnea    on exertion   Hyperlipidemia    Hypertension    Macular pucker, right eye 01/11/2020   The nature of macular pucker (epiretinal membrane ERM) was discussed with the patient as well as threshold criteria for vitrectomy surgery. I explained that in rare cases another surgery is needed to actually remove a second wrinkle should it regrow.  Most often, the epiretinal membrane and underlying wrinkled internal limiting membrane are removed with the first surgery, to accomplish the goals.    Pneumothorax, spontaneous, tension 1960   SOB (shortness of breath) on exertion     Past Surgical History:  Procedure Laterality Date   EYE SURGERY     bilateral cataract with lens implants   LEFT HEART CATH AND  CORONARY ANGIOGRAPHY N/A 11/28/2019   Procedure: LEFT HEART CATH AND CORONARY ANGIOGRAPHY;  Surgeon: Nigel Mormon, MD;  Location: [place] [facility];  Service: Cardiovascular;  Laterality: N/A;   LUNG SURGERY     40 years ago   TOE SURGERY     TOTAL KNEE ARTHROPLASTY Left 10/06/2017   Procedure: LEFT TOTAL KNEE ARTHROPLASTY;  Surgeon: Susa Day, MD;  Location: [facility];  Service: Orthopedics;  Laterality: Left;  Adductor Block    Current Medications: Current Meds  Medication Sig   albuterol (VENTOLIN HFA) 108 (90 Base) MCG/ACT inhaler Inhale 2 puffs into the lungs every 6 (six) hours as needed for wheezing or shortness of breath.   aspirin 81 MG chewable tablet Chew 81 mg by mouth daily.   Calcium-Vitamin D-Vitamin K (CALCIUM + D) [phone] MG-UNT-MCG CHEW as directed Orally once a day   dutasteride (AVODART) 0.5 MG capsule Take 0.5 mg by mouth daily.   hydrALAZINE (APRESOLINE) 25 MG tablet Take 1 tablet (25 mg total) by mouth 3 (three) times daily.   levothyroxine (SYNTHROID) 137 MCG tablet Take 137 mcg by mouth daily before breakfast. Was told on 11/06/19 by PCP not to take   losartan (COZAAR) 25 MG tablet Take 1 tablet (25 mg total) by mouth daily.   lovastatin (MEVACOR) 40 MG tablet Take 40 mg by mouth daily.    magnesium oxide (MAG-OX) 400 MG tablet Take 400 mg by mouth daily.   meloxicam (MOBIC)  7.5 MG tablet Take 7.5 mg by mouth daily as needed for pain.    nitroGLYCERIN (NITROSTAT) 0.4 MG SL tablet Place 1 tablet (0.4 mg total) under the tongue every 5 (five) minutes as needed for chest pain.   omeprazole (PRILOSEC) 40 MG capsule Take 20 mg by mouth daily before breakfast.     Allergies:   Ciprofloxacin, Ciprocin-fluocin-procin [fluocinolone acetonide], Fluocinolone acetonide, Quinolones, Statins, Imiquimod, and Penicillins   Social History   Socioeconomic History   Marital status: Divorced    Spouse name: Not on file   Number of children: 0   Years of education: Not  on file   Highest education level: Not on file  Occupational History   Occupation: attorney  Tobacco Use   Smoking status: Former    Packs/day: 1.00    Years: 5.00    Total pack years: 5.00    Types: Cigarettes    Quit date: 11/24/1958    Years since quitting: 63.5   Smokeless tobacco: Never  Vaping Use   Vaping Use: Never used  Substance and Sexual Activity   Alcohol use: No   Drug use: No   Sexual activity: Not on file  Other Topics Concern   Not on file  Social History Narrative   Not on file   Social Determinants of Health   Financial Resource Strain: Not on file  Food Insecurity: Not on file  Transportation Needs: Not on file  Physical Activity: Not on file  Stress: Not on file  Social Connections: Not on file     Family History: The patient's negative for CAD.  ROS:   Please see the history of present illness.     All other systems reviewed and are negative.  EKGs/Labs/Other Studies Reviewed:    The following studies were reviewed today: Echocardiogram: 09/2019: LVEF 55-60%, mild LVH, normal diastolic filling pressures, normal left atrial pressures, mildly dilated left atrium, mild MR, mild TR. Large liver cyst (6.9x7.8cm) noted. Consider dedicated imaging, if clinically indicated.   Stress Testing:  Lexiscan 09/11/2019: Nondiagnostic ECG stress. The baseline blood pressure was 80/54 mmHg and decreased to 78/54 mmHg at peak infusion, which is a physiologic response to Intravenous Lexiscan. Patient asymptomatic. Resting EKG demonstrated normal sinus rhythm. Non-specific ST-T abnormality. Peak EKG revealed no significant ST-T change from baseline abnormality. Myocardial perfusion is normal. Stress LV EF: 52%. No previous exam available for comparison. Low risk study.    Heart Catheterization: 11/28/2019 by Dr. Joya Gaskins Patwardhan: Normal epicardial coronary arteries.  Normal LVEDP.   Carotid Duplex: 16/04/9603: Peak systolic velocities in the right bifurcation,  internal, external and common carotid arteries are within normal limits. Minimal stenosis in the left internal carotid artery (minimal) with homogenous plaque.  Right vertebral artery flow is not visualized. Antegrade left vertebral artery flow.   14 - day [facility] Cardiac Ambulatory Telemetry. Enrollment Period: 10/18/2019-10/31/2019 Predominant rhythm normal sinus with heart rate ranging from 45 - 121 bpm, average heart rate 69 bpm. No pauses greater than or equal to 2.5 seconds. No atrial fibrillation detected during the monitoring period.   Total supraventricular ectopic burden <1% (323 supraventricular ectopy, 16 supraventricular runs). Longest run was 22 beats in duration at 162 bpm. The fastest run was 3 beats in duration at 171 bpm. Total ventricular ectopic burden <1% (6925 ventricular ectopy, 0 ventricular pairs and 0 ventricular runs). Heart rate < 60 bpm for 27.8% of the recording. Heart rate > 100 bpm for 3.4% of the recording. No Auto triggered events.  21 Patient triggered events reviewed, not associated with any significant arrhythmia (baseline artifact in many tracings).    EKG:  EKG is  ordered today.  The ekg ordered today demonstrates NSR rate 82, normal. I have personally reviewed and interpreted this study.   Recent Labs: No results found for requested labs within last 365 days.  Recent Lipid Panel    Component Value Date/Time   CHOL 139 09/25/2019 0813   TRIG 122 09/25/2019 0813   HDL 45 09/25/2019 0813   LDLCALC 72 09/25/2019 0813   Dated 01/15/21: cholesterol 141, triglycerides 154, HDL 47, non HDL 94.  Dated 01/20/22: A1c 5.7%  Risk Assessment/Calculations:                Physical Exam:    VS:  BP 128/74   Pulse 82   Ht 6' (1.829 m)   Wt 176 lb 9.6 oz (80.1 kg)   SpO2 99%   BMI 23.95 kg/m     Wt Readings from Last 3 Encounters:  05/15/22 176 lb 9.6 oz (80.1 kg)  05/11/22 180 lb (81.6 kg)  05/05/22 179 lb (81.2 kg)     GEN:  Well nourished,  well developed in no acute distress HEENT: Normal NECK: No JVD; No carotid bruits LYMPHATICS: No lymphadenopathy CARDIAC: RRR, no murmurs, rubs, gallops RESPIRATORY:  Clear to auscultation without rales, wheezing or rhonchi  ABDOMEN: Soft, non-tender, non-distended MUSCULOSKELETAL:  No edema; No deformity  SKIN: Warm and dry NEUROLOGIC:  Alert and oriented x 3 PSYCHIATRIC:  Normal affect   ASSESSMENT:    1. Primary hypertension   2. Dyspnea on exertion    PLAN:    In order of problems listed above:  HTN. High recordings at home but normal here today. Not sure if this is really an issue. Will arrange 24 hour BP monitor. With history of orthostasis would need to be cautious with increasing therapy. Chronic dyspnea on exertion. Extensive negative cardiac and pulmonary evaluation.           Medication Adjustments/Labs and Tests Ordered: Current medicines are reviewed at length with the patient today.  Concerns regarding medicines are outlined above.  No orders of the defined types were placed in this encounter.  No orders of the defined types were placed in this encounter.   There are no Patient Instructions on file for this visit.   Signed, Soledad Budreau [place], MD  05/15/2022 2:02 PM    [place]

## 2022-05-04 ENCOUNTER — Telehealth: Payer: Self-pay | Admitting: *Deleted

## 2022-05-04 NOTE — Telephone Encounter (Signed)
     Patient  visit on 04/26/2022  at [employer] [facility] ed  was for elevated blood pressure   Have you been able to follow up with your primary care physician? has  seen cardio Doctor and is doing better with BP range patient has access to needed meds  The patient was able to obtain any needed medicine or equipment.  Are there diet recommendations that you are having difficulty following?  Patient expresses understanding of discharge instructions and education provided has no other needs at this time.   [place] [phone] [address] , [place] [address] Email : [email or web address]

## 2022-05-05 ENCOUNTER — Ambulatory Visit: Payer: PPO

## 2022-05-05 VITALS — BP 145/93 | HR 88 | Resp 16 | Ht 72.0 in | Wt 179.0 lb

## 2022-05-05 DIAGNOSIS — I951 Orthostatic hypotension: Secondary | ICD-10-CM

## 2022-05-05 DIAGNOSIS — I10 Essential (primary) hypertension: Secondary | ICD-10-CM | POA: Diagnosis not present

## 2022-05-05 MED ORDER — HYDRALAZINE HCL 25 MG PO TABS: 25.0000 mg | ORAL_TABLET | Freq: Three times a day (TID) | ORAL | 3 refills | Status: DC

## 2022-05-05 NOTE — Progress Notes (Signed)
Joseph [name] Date of Birth: 07-27-1934 MRN: [id number] Primary Care Provider:Pharr, Thayer Jew, MD  Date: 05/05/22 Last Office Visit: 11/11/2021  Chief Complaint  Patient presents with   Hypertension   Follow-up    HPI  Joseph [name] is a 87 y.o.  male whose past medical history and cardiovascular risk factors include: Hypertension, hyperlipidemia, advanced age, former smoker, asthma.  Has been followed by the practice for dyspnea on exertion for which she is undergone a very thorough cardiovascular work-up.  Symptoms are likely secondary to underlying asthma.  Patient presents today for complaints of elevated home blood pressure readings. He brings an extensive log of home blood pressure readings that are 160-180s/  80-90s. He does have history of orthostatic hypotension that was documented by PCP. His blood pressure mediations have been adjusted and titrated slowly due to this.  He is active and walks daily and does follow a low-sodium diet.  Patient denies any near-syncope or syncope.  He stays well-hydrated, no loss of blood, consuming 3 balanced heart healthy meals.  ALLERGIES: Allergies  Allergen Reactions   Ciprofloxacin Anaphylaxis and Other (See Comments)    Caused sores & blisters and a trip to the hospital- was also taking Aldara at the same time, however   Ciprocin-Fluocin-Procin [Fluocinolone Acetonide] Other (See Comments)    Blisters and sores   Fluocinolone Acetonide Other (See Comments)    Reaction not recalled   Quinolones Other (See Comments)    Pt had to go to hospital (sores and blisters)   Statins Other (See Comments)    Made the legs hurt   Imiquimod Other (See Comments)    (Aldara) Caused sores that landed him in the hospital for 3 days- was taking Cipro at the same time, however   Penicillins Itching, Rash and Other (See Comments)    Has patient had a PCN reaction causing immediate rash, facial/tongue/throat swelling, SOB or lightheadedness with  hypotension: Yes Has patient had a PCN reaction causing severe rash involving mucus membranes or skin necrosis: No Has patient had a PCN reaction that required hospitalization: No Has patient had a PCN reaction occurring within the last 10 years: No If all of the above answers are "NO", then may proceed with Cephalosporin use.     MEDICATION LIST PRIOR TO VISIT: Current Outpatient Medications on File Prior to Visit  Medication Sig Dispense Refill   albuterol (VENTOLIN HFA) 108 (90 Base) MCG/ACT inhaler Inhale 2 puffs into the lungs every 6 (six) hours as needed for wheezing or shortness of breath.     aspirin 81 MG chewable tablet Chew 81 mg by mouth daily.     Calcium-Vitamin D-Vitamin K (CALCIUM + D) [phone] MG-UNT-MCG CHEW as directed Orally once a day     dutasteride (AVODART) 0.5 MG capsule Take 0.5 mg by mouth daily.     levothyroxine (SYNTHROID) 137 MCG tablet Take 137 mcg by mouth daily before breakfast. Was told on 11/06/19 by PCP not to take     losartan (COZAAR) 25 MG tablet Take 1 tablet (25 mg total) by mouth daily. 90 tablet 3   lovastatin (MEVACOR) 40 MG tablet Take 40 mg by mouth daily.      magnesium oxide (MAG-OX) 400 MG tablet Take 400 mg by mouth daily.     meloxicam (MOBIC) 7.5 MG tablet Take 7.5 mg by mouth daily as needed for pain.      nitroGLYCERIN (NITROSTAT) 0.4 MG SL tablet Place 1 tablet (0.4 mg total) under the  tongue every 5 (five) minutes as needed for chest pain. 30 tablet 3   omeprazole (PRILOSEC) 40 MG capsule Take 20 mg by mouth daily before breakfast.     No current facility-administered medications on file prior to visit.    PAST MEDICAL HISTORY: Past Medical History:  Diagnosis Date   Arthritis    COPD (chronic obstructive pulmonary disease) ([place])    Dyspnea    on exertion   Hyperlipidemia    Hypertension    Macular pucker, right eye 01/11/2020   The nature of macular pucker (epiretinal membrane ERM) was discussed with the patient as well as  threshold criteria for vitrectomy surgery. I explained that in rare cases another surgery is needed to actually remove a second wrinkle should it regrow.  Most often, the epiretinal membrane and underlying wrinkled internal limiting membrane are removed with the first surgery, to accomplish the goals.    Pneumothorax, spontaneous, tension 1960   SOB (shortness of breath) on exertion     PAST SURGICAL HISTORY: Past Surgical History:  Procedure Laterality Date   EYE SURGERY     bilateral cataract with lens implants   LEFT HEART CATH AND CORONARY ANGIOGRAPHY N/A 11/28/2019   Procedure: LEFT HEART CATH AND CORONARY ANGIOGRAPHY;  Surgeon: Nigel Mormon, MD;  Location: [facility];  Service: Cardiovascular;  Laterality: N/A;   LUNG SURGERY     40 years ago   TOE SURGERY     TOTAL KNEE ARTHROPLASTY Left 10/06/2017   Procedure: LEFT TOTAL KNEE ARTHROPLASTY;  Surgeon: Susa Day, MD;  Location: [facility];  Service: Orthopedics;  Laterality: Left;  Adductor Block    FAMILY HISTORY: No family history of premature coronary disease or sudden cardiac death.   SOCIAL HISTORY:  The patient  reports that he quit smoking about 63 years ago. His smoking use included cigarettes. He has a 5.00 pack-year smoking history. He has never used smokeless tobacco. He reports that he does not drink alcohol and does not use drugs.  Review of Systems  Cardiovascular:  Negative for chest pain, dyspnea on exertion, leg swelling, orthopnea, palpitations, paroxysmal nocturnal dyspnea and syncope.  Respiratory:  Positive for shortness of breath (chronic and stable).     PHYSICAL EXAM:    05/05/2022    2:38 PM 04/27/2022    2:15 PM 04/27/2022    4:28 AM  Vitals with BMI  Height '6\' 0"'$  '6\' 0"'$    Weight 179 lbs 179 lbs   BMI 95.62 13.08   Systolic 657 846 962  Diastolic 93 75 76  Pulse 88 79 74   Orthostatic VS for the past 72 hrs (Last 3 readings):  Patient Position BP Location Cuff Size  05/05/22  1438 Sitting Left Arm Normal   Physical Exam  Constitutional:  Age appropriate, hemodynamically stable.   Neck: No JVD present.  Cardiovascular: Normal rate, regular rhythm, S1 normal, S2 normal, intact distal pulses and normal pulses. Exam reveals no gallop, no S3 and no S4.  No murmur heard. Pulmonary/Chest: Effort normal and breath sounds normal. No stridor. He has no wheezes. He has no rales.  Musculoskeletal:        General: No edema.  Neurological: He is alert and oriented to person, place, and time. He has intact cranial nerves (2-12).  Skin: Skin is warm and moist.   EKG: 04/07/2022: Normal sinus rhythm, 83 bpm, without underlying ischemia or injury pattern.   Echocardiogram: 09/2019: LVEF 55-60%, mild LVH, normal diastolic filling pressures, normal  left atrial pressures, mildly dilated left atrium, mild MR, mild TR. Large liver cyst (6.9x7.8cm) noted. Consider dedicated imaging, if clinically indicated.   Stress Testing:  Lexiscan 09/11/2019: Nondiagnostic ECG stress. The baseline blood pressure was 80/54 mmHg and decreased to 78/54 mmHg at peak infusion, which is a physiologic response to Intravenous Lexiscan. Patient asymptomatic. Resting EKG demonstrated normal sinus rhythm. Non-specific ST-T abnormality. Peak EKG revealed no significant ST-T change from baseline abnormality. Myocardial perfusion is normal. Stress LV EF: 52%. No previous exam available for comparison. Low risk study.    Heart Catheterization: 11/28/2019 by Dr. Joya Gaskins Patwardhan: Normal epicardial coronary arteries.  Normal LVEDP.   Carotid Duplex: 44/09/4740: Peak systolic velocities in the right bifurcation, internal, external and common carotid arteries are within normal limits. Minimal stenosis in the left internal carotid artery (minimal) with homogenous plaque.  Right vertebral artery flow is not visualized. Antegrade left vertebral artery flow.  14 - day [facility] Cardiac Ambulatory Telemetry. Enrollment  Period: 10/18/2019-10/31/2019 Predominant rhythm normal sinus with heart rate ranging from 45 - 121 bpm, average heart rate 69 bpm. No pauses greater than or equal to 2.5 seconds. No atrial fibrillation detected during the monitoring period.   Total supraventricular ectopic burden <1% (323 supraventricular ectopy, 16 supraventricular runs). Longest run was 22 beats in duration at 162 bpm. The fastest run was 3 beats in duration at 171 bpm. Total ventricular ectopic burden <1% (6925 ventricular ectopy, 0 ventricular pairs and 0 ventricular runs). Heart rate < 60 bpm for 27.8% of the recording. Heart rate > 100 bpm for 3.4% of the recording. No Auto triggered events.   21 Patient triggered events reviewed, not associated with any significant arrhythmia (baseline artifact in many tracings).   Recent labs: 01/28/2022: Cholesterol 153, triglycerides 154, HDL 45, LDL 77  Hemoglobin A1c 5.7%  TSH 5.96  Hemoglobin 14.1, hematocrit 42.7, MCV 88.8, platelet count 205  Sodium 141, potassium 4.2, BUN 14, creatinine 1.18, glucose 109  LABORATORY DATA:  FINAL MEDICATION LIST END OF ENCOUNTER: Meds ordered this encounter  Medications   hydrALAZINE (APRESOLINE) 25 MG tablet    Sig: Take 1 tablet (25 mg total) by mouth 3 (three) times daily.    Dispense:  90 tablet    Refill:  3    Order Specific Question:   Supervising Provider    Answer:   Adrian Prows [[id number]]    Medications Discontinued During This Encounter  Medication Reason   hydrALAZINE (APRESOLINE) 10 MG tablet      Current Outpatient Medications:    albuterol (VENTOLIN HFA) 108 (90 Base) MCG/ACT inhaler, Inhale 2 puffs into the lungs every 6 (six) hours as needed for wheezing or shortness of breath., Disp: , Rfl:    aspirin 81 MG chewable tablet, Chew 81 mg by mouth daily., Disp: , Rfl:    Calcium-Vitamin D-Vitamin K (CALCIUM + D) [id number] MG-UNT-MCG CHEW, as directed Orally once a day, Disp: , Rfl:    dutasteride (AVODART) 0.5 MG  capsule, Take 0.5 mg by mouth daily., Disp: , Rfl:    hydrALAZINE (APRESOLINE) 25 MG tablet, Take 1 tablet (25 mg total) by mouth 3 (three) times daily., Disp: 90 tablet, Rfl: 3   levothyroxine (SYNTHROID) 137 MCG tablet, Take 137 mcg by mouth daily before breakfast. Was told on 11/06/19 by PCP not to take, Disp: , Rfl:    losartan (COZAAR) 25 MG tablet, Take 1 tablet (25 mg total) by mouth daily., Disp: 90 tablet, Rfl: 3   lovastatin (MEVACOR) 40  MG tablet, Take 40 mg by mouth daily. , Disp: , Rfl:    magnesium oxide (MAG-OX) 400 MG tablet, Take 400 mg by mouth daily., Disp: , Rfl:    meloxicam (MOBIC) 7.5 MG tablet, Take 7.5 mg by mouth daily as needed for pain. , Disp: , Rfl:    nitroGLYCERIN (NITROSTAT) 0.4 MG SL tablet, Place 1 tablet (0.4 mg total) under the tongue every 5 (five) minutes as needed for chest pain., Disp: 30 tablet, Rfl: 3   omeprazole (PRILOSEC) 40 MG capsule, Take 20 mg by mouth daily before breakfast., Disp: , Rfl:   IMPRESSION:    ICD-10-CM   1. Primary hypertension  I10     2. Orthostatic hypotension  I95.1      RECOMMENDATIONS:  Primary hypertension Orthostatic hypotension He brings with him an extensive log of home blood pressure readings that are elevated.  Will have this scanned into the chart. Increase hydralazine to 25 mg 3 times daily and continue losartan 25 mg daily. I have advised him to keep a log of home blood pressure readings with goal systolic BP around 088 mmHg given episodes of orthostasis in the past. Currently he is asymptomatic. Advised him to notify office if is develops dizziness, lightheadedness when changing positions or syncopal episodes. Discussed importance of low-sodium diet and exercise.    Follow-up in 3 months or sooner if needed.    Ernst Spell, [place] Pager: [phone] Office: [phone]

## 2022-05-08 DIAGNOSIS — R399 Unspecified symptoms and signs involving the genitourinary system: Secondary | ICD-10-CM | POA: Diagnosis not present

## 2022-05-11 ENCOUNTER — Ambulatory Visit: Payer: PPO

## 2022-05-11 VITALS — BP 137/84 | HR 94

## 2022-05-11 VITALS — BP 109/68 | HR 88 | Resp 15 | Ht 72.0 in | Wt 180.0 lb

## 2022-05-11 DIAGNOSIS — I10 Essential (primary) hypertension: Secondary | ICD-10-CM

## 2022-05-11 NOTE — Progress Notes (Signed)
Patient came in for a blood pressure check per his provider Ernst Spell, NP) to compare our reading with his at home machine. After comparing the two, his machine was about 15-20 mmHg higher than our in-office machine. After speaking with Ernst Spell, NP he has decided to enroll in remote patient monitoring for blood pressure. Elmo Putt will assist him in setting this up.   Matthew Saras, LPN

## 2022-05-11 NOTE — Progress Notes (Signed)
Joseph [name] Date of Birth: January 26, 1935 MRN: [id number] Primary Care Provider:Pharr, Thayer Jew, MD  Date: 05/11/22 Last Office Visit: 11/11/2021  Chief Complaint  Patient presents with   Hypertension   Follow-up    HPI  Joseph [name] is a 87 y.o.  male whose past medical history and cardiovascular risk factors include: Hypertension, hyperlipidemia, advanced age, former smoker, asthma.  Has been followed by the practice for dyspnea on exertion for which she is undergone a very thorough cardiovascular work-up.  Symptoms are likely secondary to underlying asthma.  Patient seen in office today as nurse visit with list of recent blood pressure readings.  At last visit hydralazine was increased to 25 mg 3 times daily and overall blood pressures have been well controlled.  He did have 2 readings of blood pressure 170/77 and 180/82.  He did check his blood pressure this morning prior to coming in the office and it was 172/86 however, blood pressure was under good control during office check. He denies chest pain, shortness of breath, palpitations, leg edema, orthopnea, PND, TIA/syncope.  ALLERGIES: Allergies  Allergen Reactions   Ciprofloxacin Anaphylaxis and Other (See Comments)    Caused sores & blisters and a trip to the hospital- was also taking Aldara at the same time, however   Ciprocin-Fluocin-Procin [Fluocinolone Acetonide] Other (See Comments)    Blisters and sores   Fluocinolone Acetonide Other (See Comments)    Reaction not recalled   Quinolones Other (See Comments)    Pt had to go to hospital (sores and blisters)   Statins Other (See Comments)    Made the legs hurt   Imiquimod Other (See Comments)    (Aldara) Caused sores that landed him in the hospital for 3 days- was taking Cipro at the same time, however   Penicillins Itching, Rash and Other (See Comments)    Has patient had a PCN reaction causing immediate rash, facial/tongue/throat swelling, SOB or lightheadedness  with hypotension: Yes Has patient had a PCN reaction causing severe rash involving mucus membranes or skin necrosis: No Has patient had a PCN reaction that required hospitalization: No Has patient had a PCN reaction occurring within the last 10 years: No If all of the above answers are "NO", then may proceed with Cephalosporin use.     MEDICATION LIST PRIOR TO VISIT: Current Outpatient Medications on File Prior to Visit  Medication Sig Dispense Refill   albuterol (VENTOLIN HFA) 108 (90 Base) MCG/ACT inhaler Inhale 2 puffs into the lungs every 6 (six) hours as needed for wheezing or shortness of breath.     aspirin 81 MG chewable tablet Chew 81 mg by mouth daily.     Calcium-Vitamin D-Vitamin K (CALCIUM + D) [phone] MG-UNT-MCG CHEW as directed Orally once a day     dutasteride (AVODART) 0.5 MG capsule Take 0.5 mg by mouth daily.     hydrALAZINE (APRESOLINE) 25 MG tablet Take 1 tablet (25 mg total) by mouth 3 (three) times daily. 90 tablet 3   levothyroxine (SYNTHROID) 137 MCG tablet Take 137 mcg by mouth daily before breakfast. Was told on 11/06/19 by PCP not to take     losartan (COZAAR) 25 MG tablet Take 1 tablet (25 mg total) by mouth daily. 90 tablet 3   lovastatin (MEVACOR) 40 MG tablet Take 40 mg by mouth daily.      magnesium oxide (MAG-OX) 400 MG tablet Take 400 mg by mouth daily.     meloxicam (MOBIC) 7.5 MG tablet Take 7.5  mg by mouth daily as needed for pain.      omeprazole (PRILOSEC) 40 MG capsule Take 20 mg by mouth daily before breakfast.     nitroGLYCERIN (NITROSTAT) 0.4 MG SL tablet Place 1 tablet (0.4 mg total) under the tongue every 5 (five) minutes as needed for chest pain. 30 tablet 3   No current facility-administered medications on file prior to visit.    PAST MEDICAL HISTORY: Past Medical History:  Diagnosis Date   Arthritis    COPD (chronic obstructive pulmonary disease) ([place])    Dyspnea    on exertion   Hyperlipidemia    Hypertension    Macular pucker,  right eye 01/11/2020   The nature of macular pucker (epiretinal membrane ERM) was discussed with the patient as well as threshold criteria for vitrectomy surgery. I explained that in rare cases another surgery is needed to actually remove a second wrinkle should it regrow.  Most often, the epiretinal membrane and underlying wrinkled internal limiting membrane are removed with the first surgery, to accomplish the goals.    Pneumothorax, spontaneous, tension 1960   SOB (shortness of breath) on exertion     PAST SURGICAL HISTORY: Past Surgical History:  Procedure Laterality Date   EYE SURGERY     bilateral cataract with lens implants   LEFT HEART CATH AND CORONARY ANGIOGRAPHY N/A 11/28/2019   Procedure: LEFT HEART CATH AND CORONARY ANGIOGRAPHY;  Surgeon: Nigel Mormon, MD;  Location: [facility];  Service: Cardiovascular;  Laterality: N/A;   LUNG SURGERY     40 years ago   TOE SURGERY     TOTAL KNEE ARTHROPLASTY Left 10/06/2017   Procedure: LEFT TOTAL KNEE ARTHROPLASTY;  Surgeon: Susa Day, MD;  Location: [facility];  Service: Orthopedics;  Laterality: Left;  Adductor Block    FAMILY HISTORY: No family history of premature coronary disease or sudden cardiac death.   SOCIAL HISTORY:  The patient  reports that he quit smoking about 63 years ago. His smoking use included cigarettes. He has a 5.00 pack-year smoking history. He has never used smokeless tobacco. He reports that he does not drink alcohol and does not use drugs.  Review of Systems  Cardiovascular:  Negative for chest pain, dyspnea on exertion, leg swelling, orthopnea, palpitations, paroxysmal nocturnal dyspnea and syncope.  Respiratory:  Positive for shortness of breath (chronic and stable).     PHYSICAL EXAM:    05/11/2022   10:47 AM 05/05/2022    2:38 PM 04/27/2022    2:15 PM  Vitals with BMI  Height  '6\' 0"'$  '6\' 0"'$   Weight  179 lbs 179 lbs  BMI  59.74 16.38  Systolic 453 646 803  Diastolic 84 93 75  Pulse 94  88 79   Orthostatic VS for the past 72 hrs (Last 3 readings):  Patient Position BP Location Cuff Size  05/11/22 1047 Sitting Left Arm Small    Physical Exam  Constitutional:  Age appropriate, hemodynamically stable.   Neck: No JVD present.  Cardiovascular: Normal rate, regular rhythm, S1 normal, S2 normal, intact distal pulses and normal pulses. Exam reveals no gallop, no S3 and no S4.  No murmur heard. Pulmonary/Chest: Effort normal and breath sounds normal. No stridor. He has no wheezes. He has no rales.  Musculoskeletal:        General: No edema.  Neurological: He is alert and oriented to person, place, and time. He has intact cranial nerves (2-12).  Skin: Skin is warm and moist.  EKG: 04/07/2022: Normal sinus rhythm, 83 bpm, without underlying ischemia or injury pattern.   Echocardiogram: 09/2019: LVEF 55-60%, mild LVH, normal diastolic filling pressures, normal left atrial pressures, mildly dilated left atrium, mild MR, mild TR. Large liver cyst (6.9x7.8cm) noted. Consider dedicated imaging, if clinically indicated.   Stress Testing:  Lexiscan 09/11/2019: Nondiagnostic ECG stress. The baseline blood pressure was 80/54 mmHg and decreased to 78/54 mmHg at peak infusion, which is a physiologic response to Intravenous Lexiscan. Patient asymptomatic. Resting EKG demonstrated normal sinus rhythm. Non-specific ST-T abnormality. Peak EKG revealed no significant ST-T change from baseline abnormality. Myocardial perfusion is normal. Stress LV EF: 52%. No previous exam available for comparison. Low risk study.    Heart Catheterization: 11/28/2019 by Dr. Joya Gaskins Patwardhan: Normal epicardial coronary arteries.  Normal LVEDP.   Carotid Duplex: 64/40/3474: Peak systolic velocities in the right bifurcation, internal, external and common carotid arteries are within normal limits. Minimal stenosis in the left internal carotid artery (minimal) with homogenous plaque.  Right vertebral artery flow is  not visualized. Antegrade left vertebral artery flow.  14 - day [facility] Cardiac Ambulatory Telemetry. Enrollment Period: 10/18/2019-10/31/2019 Predominant rhythm normal sinus with heart rate ranging from 45 - 121 bpm, average heart rate 69 bpm. No pauses greater than or equal to 2.5 seconds. No atrial fibrillation detected during the monitoring period.   Total supraventricular ectopic burden <1% (323 supraventricular ectopy, 16 supraventricular runs). Longest run was 22 beats in duration at 162 bpm. The fastest run was 3 beats in duration at 171 bpm. Total ventricular ectopic burden <1% (6925 ventricular ectopy, 0 ventricular pairs and 0 ventricular runs). Heart rate < 60 bpm for 27.8% of the recording. Heart rate > 100 bpm for 3.4% of the recording. No Auto triggered events.   21 Patient triggered events reviewed, not associated with any significant arrhythmia (baseline artifact in many tracings).   Recent labs: 01/28/2022: Cholesterol 153, triglycerides 154, HDL 45, LDL 77  Hemoglobin A1c 5.7%  TSH 5.96  Hemoglobin 14.1, hematocrit 42.7, MCV 88.8, platelet count 205  Sodium 141, potassium 4.2, BUN 14, creatinine 1.18, glucose 109  LABORATORY DATA:  FINAL MEDICATION LIST END OF ENCOUNTER: No orders of the defined types were placed in this encounter.   There are no discontinued medications.    Current Outpatient Medications:    albuterol (VENTOLIN HFA) 108 (90 Base) MCG/ACT inhaler, Inhale 2 puffs into the lungs every 6 (six) hours as needed for wheezing or shortness of breath., Disp: , Rfl:    aspirin 81 MG chewable tablet, Chew 81 mg by mouth daily., Disp: , Rfl:    Calcium-Vitamin D-Vitamin K (CALCIUM + D) [phone] MG-UNT-MCG CHEW, as directed Orally once a day, Disp: , Rfl:    dutasteride (AVODART) 0.5 MG capsule, Take 0.5 mg by mouth daily., Disp: , Rfl:    hydrALAZINE (APRESOLINE) 25 MG tablet, Take 1 tablet (25 mg total) by mouth 3 (three) times daily., Disp: 90 tablet,  Rfl: 3   levothyroxine (SYNTHROID) 137 MCG tablet, Take 137 mcg by mouth daily before breakfast. Was told on 11/06/19 by PCP not to take, Disp: , Rfl:    losartan (COZAAR) 25 MG tablet, Take 1 tablet (25 mg total) by mouth daily., Disp: 90 tablet, Rfl: 3   lovastatin (MEVACOR) 40 MG tablet, Take 40 mg by mouth daily. , Disp: , Rfl:    magnesium oxide (MAG-OX) 400 MG tablet, Take 400 mg by mouth daily., Disp: , Rfl:    meloxicam (MOBIC) 7.5 MG  tablet, Take 7.5 mg by mouth daily as needed for pain. , Disp: , Rfl:    omeprazole (PRILOSEC) 40 MG capsule, Take 20 mg by mouth daily before breakfast., Disp: , Rfl:    nitroGLYCERIN (NITROSTAT) 0.4 MG SL tablet, Place 1 tablet (0.4 mg total) under the tongue every 5 (five) minutes as needed for chest pain., Disp: 30 tablet, Rfl: 3  IMPRESSION:    ICD-10-CM   1. Primary hypertension  I10      RECOMMENDATIONS:  Primary hypertension Orthostatic hypotension He brings with him an extensive log of home blood pressure readings that have two elevated readings and overall well controlled since increasing hydralazine at last visit.  Will have this scanned into the chart. I have advised him to bring his home blood pressure monitor to the office for comparison.  We also discussed further remote patient monitoring for blood pressure again and he is willing to participate in this if his home blood pressure monitor does not correlate with office reading.  If home blood pressure monitor correlates with office blood pressure I have advised him that he can take an additional hydralazine for total dose of '50mg'$  for blood pressure >150/90. I have advised him to keep a log of home blood pressure readings with goal systolic BP around 428 mmHg given episodes of orthostasis in the past. Currently he is asymptomatic. Advised him to notify office if is develops dizziness, lightheadedness when changing positions or syncopal episodes. Discussed importance of low-sodium diet and  exercise.     Ernst Spell, [place] Pager: [phone] Office: [phone]

## 2022-05-12 ENCOUNTER — Ambulatory Visit: Payer: PPO

## 2022-05-15 ENCOUNTER — Encounter: Payer: Self-pay | Admitting: Cardiology

## 2022-05-15 ENCOUNTER — Ambulatory Visit: Payer: PPO | Attending: Cardiology | Admitting: Cardiology

## 2022-05-15 VITALS — BP 128/74 | HR 82 | Ht 72.0 in | Wt 176.6 lb

## 2022-05-15 DIAGNOSIS — R0609 Other forms of dyspnea: Secondary | ICD-10-CM

## 2022-05-15 DIAGNOSIS — I10 Essential (primary) hypertension: Secondary | ICD-10-CM

## 2022-05-15 NOTE — Patient Instructions (Signed)
Medication Instructions:  Continue same medications *If you need a refill on your cardiac medications before your next appointment, please call your pharmacy*   Lab Work: None ordered   Testing/Procedures: 24 hour Blood Pressure Monitor   Follow-Up: At [facility], you and your health needs are our priority.  As part of our continuing mission to provide you with exceptional heart care, we have created designated Provider Care Teams.  These Care Teams include your primary Cardiologist (physician) and Advanced Practice Providers (APPs -  Physician Assistants and Nurse Practitioners) who all work together to provide you with the care you need, when you need it.  We recommend signing up for the patient portal called "MyChart".  Sign up information is provided on this After Visit Summary.  MyChart is used to connect with patients for Virtual Visits (Telemedicine).  Patients are able to view lab/test results, encounter notes, upcoming appointments, etc.  Non-urgent messages can be sent to your provider as well.   To learn more about what you can do with MyChart, go to [email or web address].    Your next appointment:  1 year   Call in August to schedule Nov appointment     The format for your next appointment: Office   Provider: Dr.Jordan    Important Information About Sugar

## 2022-05-19 ENCOUNTER — Telehealth: Payer: Self-pay | Admitting: Cardiology

## 2022-05-19 ENCOUNTER — Telehealth: Payer: Self-pay

## 2022-05-19 NOTE — Telephone Encounter (Signed)
Initial follow up after starting RPM with home BP monitoring. BP can be on the higher end but averages out to be 143/87; probably could withstand titrating meds up. Patient stated all of his readings are about an hour after he takes his morning medications.  Average Systolic BP Level 004.59 mmHg Lowest Systolic BP Level 977 mmHg Highest Systolic BP Level 414 mmHg  05/19/2022 Tuesday at 09:25 AM 168 / 96      05/19/2022 Tuesday at 09:04 AM 162 / 103      05/18/2022 Monday at 08:59 AM 133 / 87      05/16/2022 Saturday at 09:12 AM 137 / 85      05/15/2022 Friday at 08:49 AM 153 / 89      05/14/2022 Thursday at 09:00 AM 161 / 93      05/13/2022 Wednesday at 08:56 AM 142 / 92      05/12/2022 Tuesday at 08:44 AM 140 / 83      05/12/2022 Tuesday at 08:44 AM 140 / 83      05/11/2022 Monday at 09:07 PM 157 / 96

## 2022-05-19 NOTE — Telephone Encounter (Signed)
Pt calling to set up 24 hr bp monitor

## 2022-05-20 ENCOUNTER — Ambulatory Visit: Payer: PPO | Attending: Cardiology

## 2022-05-20 ENCOUNTER — Other Ambulatory Visit: Payer: Self-pay | Admitting: Cardiology

## 2022-05-20 ENCOUNTER — Other Ambulatory Visit: Payer: Self-pay

## 2022-05-20 DIAGNOSIS — R0609 Other forms of dyspnea: Secondary | ICD-10-CM | POA: Diagnosis not present

## 2022-05-20 DIAGNOSIS — I10 Essential (primary) hypertension: Secondary | ICD-10-CM

## 2022-05-20 DIAGNOSIS — R030 Elevated blood-pressure reading, without diagnosis of hypertension: Secondary | ICD-10-CM

## 2022-05-20 MED ORDER — LOSARTAN POTASSIUM 50 MG PO TABS: 50.0000 mg | ORAL_TABLET | Freq: Every day | ORAL | 3 refills | Status: DC

## 2022-05-20 NOTE — Progress Notes (Unsigned)
24 hour ambulatory blood pressure monitor applied to patients left arm using standard adult cuff. 

## 2022-05-20 NOTE — Telephone Encounter (Signed)
I do not think he is actually taking the hydralazine three times a day so we can increase Losartan to '50mg'$ 

## 2022-05-20 NOTE — Telephone Encounter (Signed)
° °  Pt is calling back to f/u °

## 2022-05-20 NOTE — Telephone Encounter (Signed)
We have opening today.  Patient will be here at 4:00PM to have monitor applied.  He will return monitor tomorrow afternoon.

## 2022-05-25 ENCOUNTER — Telehealth: Payer: Self-pay

## 2022-05-25 NOTE — Telephone Encounter (Signed)
Patient calling for medication clarification. He said he was previously on losartan 25 mg tablet twice daily and that you spoke with him about increasing his dosage. He is now on one 50 mg tablet once daily, he is wondering if he should instead be taking it twice a day since once daily isn't an increase from his previous dose.

## 2022-05-25 NOTE — Telephone Encounter (Signed)
He should take this daily

## 2022-06-04 DIAGNOSIS — J449 Chronic obstructive pulmonary disease, unspecified: Secondary | ICD-10-CM | POA: Diagnosis not present

## 2022-06-04 DIAGNOSIS — I10 Essential (primary) hypertension: Secondary | ICD-10-CM | POA: Diagnosis not present

## 2022-06-08 ENCOUNTER — Other Ambulatory Visit: Payer: Self-pay

## 2022-06-08 DIAGNOSIS — I10 Essential (primary) hypertension: Secondary | ICD-10-CM

## 2022-06-08 MED ORDER — LOSARTAN POTASSIUM 100 MG PO TABS: 100.0000 mg | ORAL_TABLET | Freq: Every day | ORAL | 3 refills | Status: DC

## 2022-06-08 NOTE — Progress Notes (Signed)
Increase in losartan from '50mg'$  daily to '100mg'$  daily per conversation with Ernst Spell, NP due to elevated BP.   Labs in ~10 days. Patient aware.  06/08/2022 Monday at 09:08 AM 143 / 79      06/07/2022 'Sunday at 11:03 AM 125 / 72      06/06/2022 Saturday at 09:12 AM 176 / 102      06/05/2022 Friday at 09:10 AM 166 / 104      06/04/2022 Thursday at 09:31 AM 149 / 94      06/03/2022 Wednesday at 09:10 AM 144 / 93      06/02/2022 Tuesday at 09:27 AM 156 / 96      11'$ /27/2023 Monday at 09:16 AM 166 / 104

## 2022-06-10 DIAGNOSIS — I10 Essential (primary) hypertension: Secondary | ICD-10-CM | POA: Diagnosis not present

## 2022-06-18 DIAGNOSIS — I10 Essential (primary) hypertension: Secondary | ICD-10-CM | POA: Diagnosis not present

## 2022-06-19 LAB — BASIC METABOLIC PANEL
BUN/Creatinine Ratio: 15 (ref 10–24)
BUN: 16 mg/dL (ref 8–27)
CO2: 21 mmol/L (ref 20–29)
Calcium: 9.9 mg/dL (ref 8.6–10.2)
Chloride: 104 mmol/L (ref 96–106)
Creatinine, Ser: 1.09 mg/dL (ref 0.76–1.27)
Glucose: 114 mg/dL — ABNORMAL HIGH (ref 70–99)
Potassium: 4.7 mmol/L (ref 3.5–5.2)
Sodium: 141 mmol/L (ref 134–144)
eGFR: 67 mL/min/{1.73_m2} (ref >59)

## 2022-07-03 ENCOUNTER — Other Ambulatory Visit: Payer: Self-pay

## 2022-07-03 DIAGNOSIS — I10 Essential (primary) hypertension: Secondary | ICD-10-CM

## 2022-07-03 MED ORDER — HYDRALAZINE HCL 50 MG PO TABS: 50.0000 mg | ORAL_TABLET | Freq: Two times a day (BID) | ORAL | 2 refills | Status: DC

## 2022-07-03 NOTE — Progress Notes (Signed)
Per discussion with Dr. Terri Skains - patient to now take hydralazine '50mg'$  BID (previously '25mg'$  TID) due to elevated home BP. '25mg'$  total daily increase in hydralazine dose.   07/02/2022 Thursday at 10:28 AM 160 / 103      07/01/2022 Wednesday at 09:50 AM 168 / 101      06/30/2022 Tuesday at 10:00 AM 171 / 85      06/29/2022 Monday at 09:23 AM 152 / 95      06/28/2022 'Sunday at 11:02 AM 138 / 78      06/27/2022 Saturday at 09:22 AM 136 / 89      06/26/2022 Friday at 10:27 AM 172 / 105      06/26/2022 Friday at 10:27 AM 172 / 105      06/26/2022 Friday at 09:32 AM 170 / 119      06/25/2022 Thursday at 09:19 AM 148 / 90      12'$ /20/2023 Wednesday at 09:20 AM 134 / 85

## 2022-07-05 DIAGNOSIS — J449 Chronic obstructive pulmonary disease, unspecified: Secondary | ICD-10-CM | POA: Diagnosis not present

## 2022-07-05 DIAGNOSIS — I10 Essential (primary) hypertension: Secondary | ICD-10-CM | POA: Diagnosis not present

## 2022-07-11 DIAGNOSIS — I10 Essential (primary) hypertension: Secondary | ICD-10-CM | POA: Diagnosis not present

## 2022-07-28 ENCOUNTER — Encounter (INDEPENDENT_AMBULATORY_CARE_PROVIDER_SITE_OTHER): Payer: PPO | Admitting: Ophthalmology

## 2022-07-28 DIAGNOSIS — X32XXXD Exposure to sunlight, subsequent encounter: Secondary | ICD-10-CM | POA: Diagnosis not present

## 2022-07-28 DIAGNOSIS — L570 Actinic keratosis: Secondary | ICD-10-CM | POA: Diagnosis not present

## 2022-07-28 DIAGNOSIS — B078 Other viral warts: Secondary | ICD-10-CM | POA: Diagnosis not present

## 2022-07-28 DIAGNOSIS — L298 Other pruritus: Secondary | ICD-10-CM | POA: Diagnosis not present

## 2022-07-29 DIAGNOSIS — H43812 Vitreous degeneration, left eye: Secondary | ICD-10-CM | POA: Diagnosis not present

## 2022-07-29 DIAGNOSIS — H3562 Retinal hemorrhage, left eye: Secondary | ICD-10-CM | POA: Diagnosis not present

## 2022-07-29 DIAGNOSIS — H33321 Round hole, right eye: Secondary | ICD-10-CM | POA: Diagnosis not present

## 2022-07-29 DIAGNOSIS — H353132 Nonexudative age-related macular degeneration, bilateral, intermediate dry stage: Secondary | ICD-10-CM | POA: Diagnosis not present

## 2022-08-05 ENCOUNTER — Ambulatory Visit: Payer: PPO

## 2022-08-11 DIAGNOSIS — I10 Essential (primary) hypertension: Secondary | ICD-10-CM | POA: Diagnosis not present

## 2022-09-03 DIAGNOSIS — L509 Urticaria, unspecified: Secondary | ICD-10-CM | POA: Diagnosis not present

## 2022-09-03 DIAGNOSIS — R21 Rash and other nonspecific skin eruption: Secondary | ICD-10-CM | POA: Diagnosis not present

## 2022-09-10 DIAGNOSIS — I10 Essential (primary) hypertension: Secondary | ICD-10-CM | POA: Diagnosis not present

## 2022-10-10 DIAGNOSIS — I10 Essential (primary) hypertension: Secondary | ICD-10-CM | POA: Diagnosis not present

## 2022-10-22 ENCOUNTER — Other Ambulatory Visit: Payer: Self-pay

## 2022-10-22 DIAGNOSIS — I10 Essential (primary) hypertension: Secondary | ICD-10-CM

## 2022-10-22 MED ORDER — HYDRALAZINE HCL 50 MG PO TABS: 50.0000 mg | ORAL_TABLET | Freq: Two times a day (BID) | ORAL | 2 refills | Status: DC

## 2022-10-30 NOTE — Progress Notes (Signed)
Averages January 2024 summary Average Systolic BP Level 146.68 mmHg Lowest Systolic BP Level 112 mmHg Highest Systolic BP Level 167 mmHg  Feb 2024 Systolic Blood Pressure 136.8 (93.0 - 170.0) Diastolic Blood Pressure 79.8 (09.8 - 109.0) Heart Rate  74.6 (60.0 - 92.0)  Mar 2024 Systolic Blood Pressure 137.2 (93.0 - 170.0) Diastolic Blood Pressure 80.5 (11.9 - 109.0) Heart Rate  75.1 (58.0 - 98.0)  April 2024 Average BP 143/84 mmHg Average HR 75 bpm  Date Systolic Diastolic Units Heart Rate Units 08/07/22 9:10 AM 167 96 mmHg 73 bpm 08/08/22 9:18 AM 111 63 mmHg 74 bpm 08/09/22 11:51 AM 144 93 mmHg 66 bpm 08/10/22 8:51 AM 148 80 mmHg 70 bpm 08/11/22 9:13 AM 136 78 mmHg 74 bpm 08/12/22 9:33 AM 133 76 mmHg 73 bpm 08/13/22 10:01 AM 113 66 mmHg 78 bpm 08/14/22 9:09 AM 163 98 mmHg 87 bpm 08/15/22 10:24 AM 132 76 mmHg 82 bpm 08/16/22 9:11 AM 139 80 mmHg 70 bpm 08/18/22 9:58 AM 120 67 mmHg 78 bpm 08/19/22 12:20 PM 128 75 mmHg 77 bpm 08/20/22 5:52 PM 154 97 mmHg 73 bpm 08/21/22 9:34 AM 112 56 mmHg 85 bpm 08/22/22 9:46 AM 147 89 mmHg 64 bpm 08/23/22 11:50 AM 156 85 mmHg 67 bpm 08/24/22 9:10 AM 137 73 mmHg 70 bpm 08/25/22 12:15 PM 148 86 mmHg 60 bpm 08/26/22 9:06 AM 131 83 mmHg 67 bpm 08/27/22 9:22 AM 152 91 mmHg 61 bpm 08/28/22 8:30 AM 145 82 mmHg 85 bpm 08/29/22 8:47 AM 93 53 mmHg 90 bpm 08/29/22 8:59 AM 115 63 mmHg 84 bpm 08/30/22 1:17 PM 139 83 mmHg 75 bpm 08/31/22 11:50 AM 170 109 mmHg 68 bpm 09/01/22 10:05 AM 115 64 mmHg 92 bpm 09/02/22 8:39 AM 145 92 mmHg 72 bpm 09/03/22 9:18 AM 146 89 mmHg 77 bpm 09/04/22 10:41 AM 150 83 mmHg 67 bpm 09/05/22 9:48 AM 108 66 mmHg 98 bpm 09/06/22 11:49 AM 127 76 mmHg 81 bpm 09/07/22 9:12 AM 162 93 mmHg 68 bpm 09/08/22 9:19 AM 139 83 mmHg 66 bpm 09/09/22 10:13 AM 136 81 mmHg 84 bpm 09/11/22 9:27 AM 121 72 mmHg 82 bpm 09/12/22 9:11 AM 119 70 mmHg 80 bpm 09/13/22 8:51 AM 149 89 mmHg 64 bpm 09/14/22 9:41 AM 154 90 mmHg 73 bpm 09/15/22 9:52 AM 153 92 mmHg 96 bpm 09/16/22 9:24  AM 119 66 mmHg 81 bpm 09/18/22 9:25 AM 147 92 mmHg 58 bpm 09/19/22 9:39 AM 166 102 mmHg 64 bpm 09/20/22 8:57 AM 145 82 mmHg 64 bpm 09/21/22 9:16 AM 149 95 mmHg 70 bpm 09/23/22 3:37 PM 128 83 mmHg 83 bpm 09/24/22 9:18 AM 127 75 mmHg 78 bpm 09/25/22 10:06 AM 133 82 mmHg 72 bpm 09/26/22 3:49 PM 127 76 mmHg 83 bpm 09/27/22 9:42 AM 147 86 mmHg 64 bpm 09/28/22 11:50 AM 149 87 mmHg 67 bpm 09/29/22 9:46 AM 125 65 mmHg 84 bpm 09/30/22 9:40 AM 115 57 mmHg 85 bpm 10/01/22 12:05 PM 154 91 mmHg 73 bpm 10/02/22 11:09 AM 151 90 mmHg 73 bpm 10/03/22 9:18 AM 145 89 mmHg 66 bpm 10/04/22 3:15 PM 125 72 mmHg 84 bpm 10/05/22 11:05 AM 128 77 mmHg 69 bpm 10/06/22 10:50 AM 156 76 mmHg 76 bpm 10/07/22 9:36 AM 122 77 mmHg 75 bpm 10/08/22 9:02 AM 157 92 mmHg 69 bpm 10/09/22 9:02 AM 142 86 mmHg 80 bpm 10/10/22 10:05 AM 150 93 mmHg 88 bpm 10/11/22 4:03 PM 146 79 mmHg 85 bpm 10/12/22 9:31 AM 112 55 mmHg  95 bpm 10/13/22 2:30 PM 141 84 mmHg 66 bpm 10/14/22 8:55 AM 111 67 mmHg 74 bpm 10/15/22 12:04 PM 131 80 mmHg 69 bpm 10/16/22 5:33 PM 136 85 mmHg 82 bpm 10/17/22 9:11 AM 143 90 mmHg 68 bpm 10/18/22 9:08 AM 173 112 mmHg 76 bpm 10/19/22 10:43 AM 158 89 mmHg 74 bpm 10/20/22 10:43 AM 163 94 mmHg 69 bpm 10/21/22 8:34 AM 133 82 mmHg 78 bpm 10/22/22 9:43 AM 148 100 mmHg 61 bpm 10/23/22 1:30 PM 118 68 mmHg 85 bpm 10/24/22 8:45 AM 129 81 mmHg 69 bpm 10/25/22 11:42 AM 152 96 mmHg 73 bpm 10/26/22 8:33 AM 148 78 mmHg 70 bpm 10/27/22 8:14 AM 146 84 mmHg 74 bpm 10/28/22 12:06 PM 162 104 mmHg 70 bpm 10/29/22 9:03 AM 153 82 mmHg 84 bpm 10/30/22 9:04 AM 155 81 mmHg 78 bpm

## 2022-11-09 DIAGNOSIS — I10 Essential (primary) hypertension: Secondary | ICD-10-CM | POA: Diagnosis not present

## 2022-11-12 ENCOUNTER — Ambulatory Visit: Payer: PPO | Admitting: Cardiology

## 2022-11-12 ENCOUNTER — Encounter: Payer: Self-pay | Admitting: Cardiology

## 2022-11-12 VITALS — BP 136/71 | HR 80 | Ht 72.0 in | Wt 176.0 lb

## 2022-11-12 DIAGNOSIS — Z87891 Personal history of nicotine dependence: Secondary | ICD-10-CM | POA: Diagnosis not present

## 2022-11-12 DIAGNOSIS — R0609 Other forms of dyspnea: Secondary | ICD-10-CM

## 2022-11-12 DIAGNOSIS — I951 Orthostatic hypotension: Secondary | ICD-10-CM

## 2022-11-12 DIAGNOSIS — E782 Mixed hyperlipidemia: Secondary | ICD-10-CM

## 2022-11-12 DIAGNOSIS — I10 Essential (primary) hypertension: Secondary | ICD-10-CM

## 2022-11-12 NOTE — Progress Notes (Signed)
Joseph [name] Date of Birth: [birth year] MRN: [id number] Primary Care Provider:Pharr, Zollie Beckers, MD  Date: 11/12/22 Last Office Visit: 04/07/2022  Chief Complaint  Patient presents with   Orthostatic hypotension   Dyspnea on exertion   Follow-up    HPI  Joseph [name] is a 88 y.o.  male whose past medical history and cardiovascular risk factors include: Hypertension, hyperlipidemia, advanced age, former smoker, asthma.  Patient presents for 25-month follow-up visit given his history of hypertension and orthostatic hypotension.  He is quite sensitive to antihypertensive medications.  He has been monitored by ambulatory blood pressure readings up until April 2024.  Home blood pressures are well-controlled.  No anginal chest pain.  Patient has a shortness of breath with effort related activities.  He walks 1 mile on a daily basis.  If it is cool in the mornings he does not have any dyspnea on exertion but if it warm he does experience it.  Since last office visit he did go for second opinion and has seen Dr. [place], progress note reviewed.  ALLERGIES: Allergies  Allergen Reactions   Ciprofloxacin Anaphylaxis and Other (See Comments)    Caused sores & blisters and a trip to the hospital- was also taking Aldara at the same time, however  Other Reaction(s): Trudie Buckler Syndrome   Ciprocin-Fluocin-Procin [Fluocinolone Acetonide] Other (See Comments)    Blisters and sores   Ezetimibe Other (See Comments)   Fluocinolone Acetonide Other (See Comments)    Reaction not recalled   Imiquimod Other (See Comments)    Other Reaction(s): Trudie Buckler Syndrome   Quinolones Other (See Comments)    Pt had to go to hospital (sores and blisters)   Statins Other (See Comments)    Made the legs hurt   Imiquimod Other (See Comments)    (Aldara) Caused sores that landed him in the hospital for 3 days- was taking Cipro at the same time, however   Penicillin V Potassium Rash   Penicillins  Itching, Rash and Other (See Comments)    Has patient had a PCN reaction causing immediate rash, facial/tongue/throat swelling, SOB or lightheadedness with hypotension: Yes Has patient had a PCN reaction causing severe rash involving mucus membranes or skin necrosis: No Has patient had a PCN reaction that required hospitalization: No Has patient had a PCN reaction occurring within the last 10 years: No If all of the above answers are "NO", then may proceed with Cephalosporin use.      MEDICATION LIST PRIOR TO VISIT: Current Outpatient Medications on File Prior to Visit  Medication Sig Dispense Refill   albuterol (VENTOLIN HFA) 108 (90 Base) MCG/ACT inhaler Inhale 2 puffs into the lungs every 6 (six) hours as needed for wheezing or shortness of breath.     alendronate (FOSAMAX) 70 MG tablet Take 70 mg by mouth once a week.     aspirin 81 MG chewable tablet Chew 81 mg by mouth daily.     Calcium-Vitamin D-Vitamin K (CALCIUM + D) [phone] MG-UNT-MCG CHEW as directed Orally once a day     dutasteride (AVODART) 0.5 MG capsule Take 0.5 mg by mouth daily.     hydrALAZINE (APRESOLINE) 50 MG tablet Take 1 tablet (50 mg total) by mouth in the morning and at bedtime. 60 tablet 2   levothyroxine (SYNTHROID) 137 MCG tablet Take 137 mcg by mouth daily before breakfast. Was told on 11/06/19 by PCP not to take     losartan (COZAAR) 100 MG tablet Take 1 tablet (100  mg total) by mouth daily. 90 tablet 3   lovastatin (MEVACOR) 40 MG tablet Take 40 mg by mouth daily.      magnesium oxide (MAG-OX) 400 MG tablet Take 400 mg by mouth daily.     meloxicam (MOBIC) 7.5 MG tablet Take 7.5 mg by mouth daily as needed for pain.      nitroGLYCERIN (NITROSTAT) 0.4 MG SL tablet Place 1 tablet (0.4 mg total) under the tongue every 5 (five) minutes as needed for chest pain. 30 tablet 3   omeprazole (PRILOSEC) 40 MG capsule Take 20 mg by mouth daily before breakfast.     No current facility-administered medications on file  prior to visit.    PAST MEDICAL HISTORY: Past Medical History:  Diagnosis Date   Arthritis    COPD (chronic obstructive pulmonary disease) (HCC)    Dyspnea    on exertion   Hyperlipidemia    Hypertension    Macular pucker, right eye 01/11/2020   The nature of macular pucker (epiretinal membrane ERM) was discussed with the patient as well as threshold criteria for vitrectomy surgery. I explained that in rare cases another surgery is needed to actually remove a second wrinkle should it regrow.  Most often, the epiretinal membrane and underlying wrinkled internal limiting membrane are removed with the first surgery, to accomplish the goals.    Pneumothorax, spontaneous, tension 1960   SOB (shortness of breath) on exertion     PAST SURGICAL HISTORY: Past Surgical History:  Procedure Laterality Date   EYE SURGERY     bilateral cataract with lens implants   LEFT HEART CATH AND CORONARY ANGIOGRAPHY N/A 11/28/2019   Procedure: LEFT HEART CATH AND CORONARY ANGIOGRAPHY;  Surgeon: Elder Negus, MD;  Location: [facility];  Service: Cardiovascular;  Laterality: N/A;   LUNG SURGERY     40 years ago   TOE SURGERY     TOTAL KNEE ARTHROPLASTY Left 10/06/2017   Procedure: LEFT TOTAL KNEE ARTHROPLASTY;  Surgeon: Jene Every, MD;  Location: [facility];  Service: Orthopedics;  Laterality: Left;  Adductor Block    FAMILY HISTORY: No family history of premature coronary disease or sudden cardiac death.   SOCIAL HISTORY:  The patient  reports that he quit smoking about 64 years ago. His smoking use included cigarettes. He has a 5.00 pack-year smoking history. He has never used smokeless tobacco. He reports that he does not drink alcohol and does not use drugs.  Review of Systems  Cardiovascular:  Negative for chest pain, dyspnea on exertion, leg swelling, orthopnea, palpitations, paroxysmal nocturnal dyspnea and syncope.  Respiratory:  Positive for shortness of breath (chronic and stable).      PHYSICAL EXAM:    11/12/2022    2:05 PM 05/15/2022    1:38 PM 05/11/2022    2:48 PM  Vitals with BMI  Height 6\' 0"  6\' 0"  6\' 0"   Weight 176 lbs 176 lbs 10 oz 180 lbs  BMI 23.86 23.95 24.41  Systolic 136 128 161  Diastolic 71 74 68  Pulse 80 82 88   Physical Exam  Constitutional: No distress.  Age appropriate, hemodynamically stable.   Neck: No JVD present.  Cardiovascular: Normal rate, regular rhythm, S1 normal, S2 normal, intact distal pulses and normal pulses. Exam reveals no gallop, no S3 and no S4.  No murmur heard. Pulmonary/Chest: Effort normal and breath sounds normal. No stridor. He has no wheezes. He has no rales.  Abdominal: Soft. Bowel sounds are normal. He exhibits  no distension. There is no abdominal tenderness.  Musculoskeletal:        General: No edema.     Cervical back: Neck supple.  Neurological: He is alert and oriented to person, place, and time. He has intact cranial nerves (2-12).  Skin: Skin is warm and moist.   EKG: Nov 12, 2022: Sinus rhythm, 72 bpm, without underlying ischemia or injury pattern.   Echocardiogram: 09/2019: LVEF 55-60%, mild LVH, normal diastolic filling pressures, normal left atrial pressures, mildly dilated left atrium, mild MR, mild TR. Large liver cyst (6.9x7.8cm) noted. Consider dedicated imaging, if clinically indicated.   Stress Testing:  Lexiscan 09/11/2019: Nondiagnostic ECG stress. The baseline blood pressure was 80/54 mmHg and decreased to 78/54 mmHg at peak infusion, which is a physiologic response to Intravenous Lexiscan. Patient asymptomatic. Resting EKG demonstrated normal sinus rhythm. Non-specific ST-T abnormality. Peak EKG revealed no significant ST-T change from baseline abnormality. Myocardial perfusion is normal. Stress LV EF: 52%. No previous exam available for comparison. Low risk study.    Heart Catheterization: 11/28/2019 by Dr. Evlyn Clines Patwardhan: Normal epicardial coronary arteries.  Normal LVEDP.   Carotid  Duplex: 11/10/2019: Peak systolic velocities in the right bifurcation, internal, external and common carotid arteries are within normal limits. Minimal stenosis in the left internal carotid artery (minimal) with homogenous plaque.  Right vertebral artery flow is not visualized. Antegrade left vertebral artery flow.  14 - day [facility] Ambulatory Telemetry. Enrollment Period: 10/18/2019-10/31/2019 Predominant rhythm normal sinus with heart rate ranging from 45 - 121 bpm, average heart rate 69 bpm. No pauses greater than or equal to 2.5 seconds. No atrial fibrillation detected during the monitoring period.   Total supraventricular ectopic burden <1% (323 supraventricular ectopy, 16 supraventricular runs). Longest run was 22 beats in duration at 162 bpm. The fastest run was 3 beats in duration at 171 bpm. Total ventricular ectopic burden <1% (6925 ventricular ectopy, 0 ventricular pairs and 0 ventricular runs). Heart rate < 60 bpm for 27.8% of the recording. Heart rate > 100 bpm for 3.4% of the recording. No Auto triggered events.   21 Patient triggered events reviewed, not associated with any significant arrhythmia (baseline artifact in many tracings).   AMBULATORY BLOOD PRESSURE MONITORING:  January 2024 summary Average Systolic BP Level 146.68 mmHg Lowest Systolic BP Level 112 mmHg Highest Systolic BP Level 167 mmHg   Feb 2024 Systolic Blood Pressure 136.8 (93.0 - 170.0) Diastolic Blood Pressure 79.8 (16.1 - 109.0) Heart Rate        74.6 (60.0 - 92.0)   Mar 2024 Systolic Blood Pressure 137.2 (93.0 - 170.0) Diastolic Blood Pressure 80.5 (09.6 - 109.0) Heart Rate        75.1 (58.0 - 98.0)   April 2024 Average BP 143/84 mmHg Average HR 75 bpm   Date     Systolic            Diastolic          Units    Heart Rate       Units 08/07/22 9:10 AM           167      96        mmHg  73        bpm 08/08/22 9:18 AM           111      63        mmHg  74        bpm 08/09/22 11:51 AM  144       93        mmHg  66        bpm 08/10/22 8:51 AM           148      80        mmHg  70        bpm 08/11/22 9:13 AM           136      78        mmHg  74        bpm 08/12/22 9:33 AM           133      76        mmHg  73        bpm 08/13/22 10:01 AM         113      66        mmHg  78        bpm 08/14/22 9:09 AM           163      98        mmHg  87        bpm 08/15/22 10:24 AM       132      76        mmHg  82        bpm 08/16/22 9:11 AM         139      80        mmHg  70        bpm 08/18/22 9:58 AM         120      67        mmHg  78        bpm 08/19/22 12:20 PM       128      75        mmHg  77        bpm 08/20/22 5:52 PM         154      97        mmHg  73        bpm 08/21/22 9:34 AM         112      56        mmHg  85        bpm 08/22/22 9:46 AM         147      89        mmHg  64        bpm 08/23/22 11:50 AM       156      85        mmHg  67        bpm 08/24/22 9:10 AM         137      73        mmHg  70        bpm 08/25/22 12:15 PM       148      86        mmHg  60        bpm 08/26/22 9:06 AM         131      83        mmHg  67  bpm 08/27/22 9:22 AM         152      91        mmHg  61        bpm 08/28/22 8:30 AM         145      82        mmHg  85        bpm 08/29/22 8:47 AM         93        53        mmHg  90        bpm 08/29/22 8:59 AM         115      63        mmHg  84        bpm 08/30/22 1:17 PM         139      83        mmHg  75        bpm 08/31/22 11:50 AM       170      109      mmHg  68        bpm 09/01/22 10:05 AM       115      64        mmHg  92        bpm 09/02/22 8:39 AM         145      92        mmHg  72        bpm 09/03/22 9:18 AM         146      89        mmHg  77        bpm 09/04/22 10:41 AM         150      83        mmHg  67        bpm 09/05/22 9:48 AM           108      66        mmHg  98        bpm 09/06/22 11:49 AM         127      76        mmHg  81        bpm 09/07/22 9:12 AM           162      93        mmHg  68        bpm 09/08/22 9:19 AM           139      83        mmHg  66         bpm 09/09/22 10:13 AM         136      81        mmHg  84        bpm 09/11/22 9:27 AM           121      72        mmHg  82        bpm 09/12/22 9:11 AM           119  70        mmHg  80        bpm 09/13/22 8:51 AM         149      89        mmHg  64        bpm 09/14/22 9:41 AM         154      90        mmHg  73        bpm 09/15/22 9:52 AM         153      92        mmHg  96        bpm 09/16/22 9:24 AM         119      66        mmHg  81        bpm 09/18/22 9:25 AM         147      92        mmHg  58        bpm 09/19/22 9:39 AM         166      102      mmHg  64        bpm 09/20/22 8:57 AM         145      82        mmHg  64        bpm 09/21/22 9:16 AM         149      95        mmHg  70        bpm 09/23/22 3:37 PM         128      83        mmHg  83        bpm 09/24/22 9:18 AM         127      75        mmHg  78        bpm 09/25/22 10:06 AM       133      82        mmHg  72        bpm 09/26/22 3:49 PM         127      76        mmHg  83        bpm 09/27/22 9:42 AM         147      86        mmHg  64        bpm 09/28/22 11:50 AM       149      87        mmHg  67        bpm 09/29/22 9:46 AM         125      65        mmHg  84        bpm 09/30/22 9:40 AM         115      57        mmHg  85        bpm 10/01/22 12:05 PM       154  91        mmHg  73        bpm 10/02/22 11:09 AM       151      90        mmHg  73        bpm 10/03/22 9:18 AM         145      89        mmHg  66        bpm 10/04/22 3:15 PM         125      72        mmHg  84        bpm 10/05/22 11:05 AM         128      77        mmHg  69        bpm 10/06/22 10:50 AM         156      76        mmHg  76        bpm 10/07/22 9:36 AM           122      77        mmHg  75        bpm 10/08/22 9:02 AM           157      92        mmHg  69        bpm 10/09/22 9:02 AM           142      86        mmHg  80        bpm 10/10/22 10:05 AM         150      93        mmHg  88        bpm 10/11/22 4:03 PM           146      79        mmHg  85        bpm 10/12/22 9:31 AM            112      55        mmHg  95        bpm 10/13/22 2:30 PM           141      84        mmHg  66        bpm 10/14/22 8:55 AM         111      67        mmHg  74        bpm 10/15/22 12:04 PM       131      80        mmHg  69        bpm 10/16/22 5:33 PM         136      85        mmHg  82        bpm 10/17/22 9:11 AM         143      90        mmHg  68  bpm 10/18/22 9:08 AM         173      112      mmHg  76        bpm 10/19/22 10:43 AM       158      89        mmHg  74        bpm 10/20/22 10:43 AM       163      94        mmHg  69        bpm 10/21/22 8:34 AM         133      82        mmHg  78        bpm 10/22/22 9:43 AM         148      100      mmHg  61        bpm 10/23/22 1:30 PM         118      68        mmHg  85        bpm 10/24/22 8:45 AM         129      81        mmHg  69        bpm 10/25/22 11:42 AM       152      96        mmHg  73        bpm 10/26/22 8:33 AM         148      78        mmHg  70        bpm 10/27/22 8:14 AM         146      84        mmHg  74        bpm 10/28/22 12:06 PM       162      104      mmHg  70        bpm 10/29/22 9:03 AM         153      82        mmHg  84        bpm 10/30/22 9:04 AM         155      81        mmHg  78        bpm  Recent labs: 09/08/2019: Glucose 110, BUN/Cr 130.93 EGFR >60. Na/K 140/4.1.  H/H 15.6/48.8.   LABORATORY DATA:    Latest Ref Rng & Units 08/06/2020    4:02 PM 11/28/2019   12:49 PM 09/08/2019   10:01 AM  CBC  WBC 4.0 - 10.5 K/uL 9.3  9.1  7.0   Hemoglobin 13.0 - 17.0 g/dL 16.1  09.6  04.5   Hematocrit 39.0 - 52.0 % 47.8  42.3  48.8   Platelets 150.0 - 400.0 K/uL 164.0  C 193  178     C Corrected result       Latest Ref Rng & Units 06/18/2022   10:30 AM 11/24/2019    8:07 AM 10/05/2019    8:08 AM  CMP  Glucose 70 - 99 mg/dL 409  811  914   BUN 8 -  27 mg/dL 16  16  18    Creatinine 0.76 - 1.27 mg/dL 1.61  0.96  0.45   Sodium 134 - 144 mmol/L 141  141  140   Potassium 3.5 - 5.2 mmol/L 4.7  4.6  4.4   Chloride 96 - 106 mmol/L 104   104  100   CO2 20 - 29 mmol/L 21  18  22    Calcium 8.6 - 10.2 mg/dL 9.9  9.9  9.9     Lipid Panel     Component Value Date/Time   CHOL 139 09/25/2019 0813   TRIG 122 09/25/2019 0813   HDL 45 09/25/2019 0813   LDLCALC 72 09/25/2019 0813   LABVLDL 22 09/25/2019 0813   FINAL MEDICATION LIST END OF ENCOUNTER: No orders of the defined types were placed in this encounter.   There are no discontinued medications.      Current Outpatient Medications:    albuterol (VENTOLIN HFA) 108 (90 Base) MCG/ACT inhaler, Inhale 2 puffs into the lungs every 6 (six) hours as needed for wheezing or shortness of breath., Disp: , Rfl:    alendronate (FOSAMAX) 70 MG tablet, Take 70 mg by mouth once a week., Disp: , Rfl:    aspirin 81 MG chewable tablet, Chew 81 mg by mouth daily., Disp: , Rfl:    Calcium-Vitamin D-Vitamin K (CALCIUM + D) [id number] MG-UNT-MCG CHEW, as directed Orally once a day, Disp: , Rfl:    dutasteride (AVODART) 0.5 MG capsule, Take 0.5 mg by mouth daily., Disp: , Rfl:    hydrALAZINE (APRESOLINE) 50 MG tablet, Take 1 tablet (50 mg total) by mouth in the morning and at bedtime., Disp: 60 tablet, Rfl: 2   levothyroxine (SYNTHROID) 137 MCG tablet, Take 137 mcg by mouth daily before breakfast. Was told on 11/06/19 by PCP not to take, Disp: , Rfl:    losartan (COZAAR) 100 MG tablet, Take 1 tablet (100 mg total) by mouth daily., Disp: 90 tablet, Rfl: 3   lovastatin (MEVACOR) 40 MG tablet, Take 40 mg by mouth daily. , Disp: , Rfl:    magnesium oxide (MAG-OX) 400 MG tablet, Take 400 mg by mouth daily., Disp: , Rfl:    meloxicam (MOBIC) 7.5 MG tablet, Take 7.5 mg by mouth daily as needed for pain. , Disp: , Rfl:    nitroGLYCERIN (NITROSTAT) 0.4 MG SL tablet, Place 1 tablet (0.4 mg total) under the tongue every 5 (five) minutes as needed for chest pain., Disp: 30 tablet, Rfl: 3   omeprazole (PRILOSEC) 40 MG capsule, Take 20 mg by mouth daily before breakfast., Disp: , Rfl:   IMPRESSION:     ICD-10-CM   1. Dyspnea on exertion  R06.09 EKG 12-Lead    2. Primary hypertension  I10     3. Orthostatic hypotension  I95.1     4. Mixed hyperlipidemia  E78.2     5. Former smoker  Z87.891        RECOMMENDATIONS: Joseph [name] is a 88 y.o. male whose past medical history and cardiovascular risk factors include: Hypertension, hyperlipidemia, advanced age, former smoker.  Initially referred to the practice for chest pain and shortness of breath evaluation and has undergone appropriate and extensive cardiovascular workup as outlined above.  Subsequently requested assistance with blood pressure management.  With uptitration of medical therapy he is also experienced orthostasis.  His medications were further titrated and later enrolled into ambulatory blood pressure monitoring.  As per ambulatory blood pressure monitoring in April 2024 his blood  pressures are within acceptable limits.  Clinically denies anginal chest pain.  Shortness of breath with effort related activities predominantly when walking and warmer weather.  Otherwise no change in physical endurance.  EKG is nonischemic.   Orders Placed This Encounter  Procedures   EKG 12-Lead    --Continue cardiac medications as reconciled in final medication list. --Return in about 6 months (around 05/15/2023) for Follow up BP and history of orthostasis. Or sooner if needed. --Continue follow-up with your primary care physician regarding the management of your other chronic comorbid conditions.  Patient's questions and concerns were addressed to his satisfaction. He voices understanding of the instructions provided during this encounter.   This note was created using a voice recognition software as a result there may be grammatical errors inadvertently enclosed that do not reflect the nature of this encounter. Every attempt is made to correct such errors.  Tessa Lerner, [place], [facility]  Pager:  [phone] Office: [phone]

## 2022-11-23 DIAGNOSIS — Z86010 Personal history of colonic polyps: Secondary | ICD-10-CM | POA: Diagnosis not present

## 2022-12-02 DIAGNOSIS — J449 Chronic obstructive pulmonary disease, unspecified: Secondary | ICD-10-CM | POA: Diagnosis not present

## 2022-12-02 DIAGNOSIS — K5730 Diverticulosis of large intestine without perforation or abscess without bleeding: Secondary | ICD-10-CM | POA: Diagnosis not present

## 2022-12-02 DIAGNOSIS — Z86010 Personal history of colonic polyps: Secondary | ICD-10-CM | POA: Diagnosis not present

## 2022-12-09 DIAGNOSIS — I10 Essential (primary) hypertension: Secondary | ICD-10-CM | POA: Diagnosis not present

## 2022-12-17 DIAGNOSIS — K5730 Diverticulosis of large intestine without perforation or abscess without bleeding: Secondary | ICD-10-CM | POA: Diagnosis not present

## 2022-12-17 DIAGNOSIS — D122 Benign neoplasm of ascending colon: Secondary | ICD-10-CM | POA: Diagnosis not present

## 2022-12-17 DIAGNOSIS — K635 Polyp of colon: Secondary | ICD-10-CM | POA: Diagnosis not present

## 2022-12-17 DIAGNOSIS — Z86010 Personal history of colonic polyps: Secondary | ICD-10-CM | POA: Diagnosis not present

## 2022-12-17 DIAGNOSIS — D123 Benign neoplasm of transverse colon: Secondary | ICD-10-CM | POA: Diagnosis not present

## 2022-12-17 DIAGNOSIS — D120 Benign neoplasm of cecum: Secondary | ICD-10-CM | POA: Diagnosis not present

## 2023-01-13 DIAGNOSIS — H353132 Nonexudative age-related macular degeneration, bilateral, intermediate dry stage: Secondary | ICD-10-CM | POA: Diagnosis not present

## 2023-01-13 DIAGNOSIS — H33321 Round hole, right eye: Secondary | ICD-10-CM | POA: Diagnosis not present

## 2023-01-13 DIAGNOSIS — H3562 Retinal hemorrhage, left eye: Secondary | ICD-10-CM | POA: Diagnosis not present

## 2023-01-13 DIAGNOSIS — H43812 Vitreous degeneration, left eye: Secondary | ICD-10-CM | POA: Diagnosis not present

## 2023-01-16 ENCOUNTER — Other Ambulatory Visit: Payer: Self-pay | Admitting: Cardiology

## 2023-01-16 DIAGNOSIS — I10 Essential (primary) hypertension: Secondary | ICD-10-CM

## 2023-01-28 DIAGNOSIS — R7303 Prediabetes: Secondary | ICD-10-CM | POA: Diagnosis not present

## 2023-01-28 DIAGNOSIS — I10 Essential (primary) hypertension: Secondary | ICD-10-CM | POA: Diagnosis not present

## 2023-01-28 DIAGNOSIS — E039 Hypothyroidism, unspecified: Secondary | ICD-10-CM | POA: Diagnosis not present

## 2023-02-10 DIAGNOSIS — R8271 Bacteriuria: Secondary | ICD-10-CM | POA: Diagnosis not present

## 2023-02-10 DIAGNOSIS — Z23 Encounter for immunization: Secondary | ICD-10-CM | POA: Diagnosis not present

## 2023-02-10 DIAGNOSIS — R911 Solitary pulmonary nodule: Secondary | ICD-10-CM | POA: Diagnosis not present

## 2023-02-10 DIAGNOSIS — E039 Hypothyroidism, unspecified: Secondary | ICD-10-CM | POA: Diagnosis not present

## 2023-02-10 DIAGNOSIS — F5101 Primary insomnia: Secondary | ICD-10-CM | POA: Diagnosis not present

## 2023-02-10 DIAGNOSIS — M8000XA Age-related osteoporosis with current pathological fracture, unspecified site, initial encounter for fracture: Secondary | ICD-10-CM | POA: Diagnosis not present

## 2023-02-10 DIAGNOSIS — I10 Essential (primary) hypertension: Secondary | ICD-10-CM | POA: Diagnosis not present

## 2023-02-10 DIAGNOSIS — Z0000 Encounter for general adult medical examination without abnormal findings: Secondary | ICD-10-CM | POA: Diagnosis not present

## 2023-02-10 DIAGNOSIS — N401 Enlarged prostate with lower urinary tract symptoms: Secondary | ICD-10-CM | POA: Diagnosis not present

## 2023-02-10 DIAGNOSIS — R399 Unspecified symptoms and signs involving the genitourinary system: Secondary | ICD-10-CM | POA: Diagnosis not present

## 2023-02-10 DIAGNOSIS — R102 Pelvic and perineal pain: Secondary | ICD-10-CM | POA: Diagnosis not present

## 2023-02-10 DIAGNOSIS — I700 Atherosclerosis of aorta: Secondary | ICD-10-CM | POA: Diagnosis not present

## 2023-02-10 DIAGNOSIS — I6529 Occlusion and stenosis of unspecified carotid artery: Secondary | ICD-10-CM | POA: Diagnosis not present

## 2023-02-10 DIAGNOSIS — J449 Chronic obstructive pulmonary disease, unspecified: Secondary | ICD-10-CM | POA: Diagnosis not present

## 2023-02-14 DIAGNOSIS — W540XXA Bitten by dog, initial encounter: Secondary | ICD-10-CM | POA: Diagnosis not present

## 2023-02-14 DIAGNOSIS — S71151A Open bite, right thigh, initial encounter: Secondary | ICD-10-CM | POA: Diagnosis not present

## 2023-02-14 DIAGNOSIS — Z23 Encounter for immunization: Secondary | ICD-10-CM | POA: Diagnosis not present

## 2023-02-22 ENCOUNTER — Ambulatory Visit: Payer: PPO | Admitting: Cardiology

## 2023-02-22 VITALS — BP 125/71 | HR 74 | Resp 16 | Ht 72.0 in | Wt 171.0 lb

## 2023-02-22 DIAGNOSIS — R42 Dizziness and giddiness: Secondary | ICD-10-CM | POA: Diagnosis not present

## 2023-02-22 DIAGNOSIS — I10 Essential (primary) hypertension: Secondary | ICD-10-CM | POA: Diagnosis not present

## 2023-02-22 DIAGNOSIS — I951 Orthostatic hypotension: Secondary | ICD-10-CM

## 2023-02-22 MED ORDER — HYDRALAZINE HCL 25 MG PO TABS: 25.0000 mg | ORAL_TABLET | Freq: Three times a day (TID) | ORAL | 3 refills | Status: DC

## 2023-02-22 NOTE — Progress Notes (Signed)
Joseph [name] Date of Birth: 1935-05-21 MRN: [id number] Primary Care Provider:Pharr, Zollie Beckers, MD  Date: 02/22/23 Last Office Visit: 11/12/2022  Chief Complaint  Patient presents with   Blood Pressure Check    Dizziness     HPI  Joseph [name] is a 88 y.o.  male whose past medical history and cardiovascular risk factors include: Hypertension, hyperlipidemia, advanced age, former smoker, asthma.  Patient has known history of hypertension and orthostatic hypotension and presented today for a blood pressure check as he was feeling dizzy.  Office blood pressures were within acceptable limits but due to symptoms he requested to be seen today.  Patient states that intermittently he has days when he feels lightheaded/dizzy with no near-syncope or syncopal events.  He does check his blood pressures about an hour after he takes his blood pressure pills.  Overall functional capacity remains stable.  In the past we have down titrated antihypertensive medications given his orthostatic hypotension; however, he is needed up titration of medical therapy due to elevated numbers.  Currently takes losartan 100 mg p.o. every morning and hydralazine 50 mg p.o. twice daily.  Home blood pressures after medications have been ranging between 90-110 mmHg.   ALLERGIES: Allergies  Allergen Reactions   Ciprofloxacin Anaphylaxis and Other (See Comments)    Caused sores & blisters and a trip to the hospital- was also taking Aldara at the same time, however  Other Reaction(s): Trudie Buckler Syndrome   Ciprocin-Fluocin-Procin [Fluocinolone Acetonide] Other (See Comments)    Blisters and sores   Ezetimibe Other (See Comments)   Fluocinolone Acetonide Other (See Comments)    Reaction not recalled   Imiquimod Other (See Comments)    Other Reaction(s): Trudie Buckler Syndrome   Quinolones Other (See Comments)    Pt had to go to hospital (sores and blisters)   Statins Other (See Comments)    Made the  legs hurt   Imiquimod Other (See Comments)    (Aldara) Caused sores that landed him in the hospital for 3 days- was taking Cipro at the same time, however   Penicillin V Potassium Rash   Penicillins Itching, Rash and Other (See Comments)    Has patient had a PCN reaction causing immediate rash, facial/tongue/throat swelling, SOB or lightheadedness with hypotension: Yes Has patient had a PCN reaction causing severe rash involving mucus membranes or skin necrosis: No Has patient had a PCN reaction that required hospitalization: No Has patient had a PCN reaction occurring within the last 10 years: No If all of the above answers are "NO", then may proceed with Cephalosporin use.      MEDICATION LIST PRIOR TO VISIT: Current Outpatient Medications on File Prior to Visit  Medication Sig Dispense Refill   albuterol (VENTOLIN HFA) 108 (90 Base) MCG/ACT inhaler Inhale 2 puffs into the lungs every 6 (six) hours as needed for wheezing or shortness of breath.     alendronate (FOSAMAX) 70 MG tablet Take 70 mg by mouth once a week.     aspirin 81 MG chewable tablet Chew 81 mg by mouth daily.     Calcium-Vitamin D-Vitamin K (CALCIUM + D) [phone] MG-UNT-MCG CHEW as directed Orally once a day     dutasteride (AVODART) 0.5 MG capsule Take 0.5 mg by mouth daily.     levothyroxine (SYNTHROID) 137 MCG tablet Take 137 mcg by mouth daily before breakfast. Was told on 11/06/19 by PCP not to take     losartan (COZAAR) 100 MG tablet Take 1 tablet (  100 mg total) by mouth daily. 90 tablet 3   lovastatin (MEVACOR) 40 MG tablet Take 40 mg by mouth daily.      magnesium oxide (MAG-OX) 400 MG tablet Take 400 mg by mouth daily.     meloxicam (MOBIC) 7.5 MG tablet Take 7.5 mg by mouth daily as needed for pain.      omeprazole (PRILOSEC) 40 MG capsule Take 20 mg by mouth daily before breakfast.     nitroGLYCERIN (NITROSTAT) 0.4 MG SL tablet Place 1 tablet (0.4 mg total) under the tongue every 5 (five) minutes as needed  for chest pain. 30 tablet 3   No current facility-administered medications on file prior to visit.    PAST MEDICAL HISTORY: Past Medical History:  Diagnosis Date   Arthritis    COPD (chronic obstructive pulmonary disease) (HCC)    Dyspnea    on exertion   Hyperlipidemia    Hypertension    Macular pucker, right eye 01/11/2020   The nature of macular pucker (epiretinal membrane ERM) was discussed with the patient as well as threshold criteria for vitrectomy surgery. I explained that in rare cases another surgery is needed to actually remove a second wrinkle should it regrow.  Most often, the epiretinal membrane and underlying wrinkled internal limiting membrane are removed with the first surgery, to accomplish the goals.    Pneumothorax, spontaneous, tension 1960   SOB (shortness of breath) on exertion     PAST SURGICAL HISTORY: Past Surgical History:  Procedure Laterality Date   EYE SURGERY     bilateral cataract with lens implants   LEFT HEART CATH AND CORONARY ANGIOGRAPHY N/A 11/28/2019   Procedure: LEFT HEART CATH AND CORONARY ANGIOGRAPHY;  Surgeon: Elder Negus, MD;  Location: [facility];  Service: Cardiovascular;  Laterality: N/A;   LUNG SURGERY     40 years ago   TOE SURGERY     TOTAL KNEE ARTHROPLASTY Left 10/06/2017   Procedure: LEFT TOTAL KNEE ARTHROPLASTY;  Surgeon: Jene Every, MD;  Location: [facility];  Service: Orthopedics;  Laterality: Left;  Adductor Block    FAMILY HISTORY: No family history of premature coronary disease or sudden cardiac death.   SOCIAL HISTORY:  The patient  reports that he quit smoking about 64 years ago. His smoking use included cigarettes. He started smoking about 69 years ago. He has a 5 pack-year smoking history. He has never used smokeless tobacco. He reports that he does not drink alcohol and does not use drugs.  Review of Systems  Cardiovascular:  Negative for chest pain, dyspnea on exertion, leg swelling, orthopnea,  palpitations, paroxysmal nocturnal dyspnea and syncope.  Respiratory:  Positive for shortness of breath (chronic and stable).   Neurological:  Positive for dizziness.    PHYSICAL EXAM:    02/22/2023    3:43 PM 11/12/2022    2:05 PM 05/15/2022    1:38 PM  Vitals with BMI  Height 6\' 0"  6\' 0"  6\' 0"   Weight 171 lbs 176 lbs 176 lbs 10 oz  BMI 23.19 23.86 23.95  Systolic 125 136 161  Diastolic 71 71 74  Pulse 74 80 82   Orthostatic VS for the past 72 hrs (Last 3 readings):  Orthostatic BP Patient Position BP Location Cuff Size Orthostatic Pulse  02/22/23 1619 99/58 Standing Left Arm Large 84  02/22/23 1618 124/63 Sitting Left Arm Large 84  02/22/23 1614 124/70 Supine Left Arm Large 70    Physical Exam  Constitutional: No distress.  Age appropriate, hemodynamically stable.   Neck: No JVD present.  Cardiovascular: Normal rate, regular rhythm, S1 normal, S2 normal, intact distal pulses and normal pulses. Exam reveals no gallop, no S3 and no S4.  No murmur heard. Pulmonary/Chest: Effort normal and breath sounds normal. No stridor. He has no wheezes. He has no rales.  Abdominal: Soft. Bowel sounds are normal. He exhibits no distension. There is no abdominal tenderness.  Musculoskeletal:        General: No edema.     Cervical back: Neck supple.  Neurological: He is alert and oriented to person, place, and time. He has intact cranial nerves (2-12).  Skin: Skin is warm and moist.   EKG: 02/22/2023: Sinus rhythm, 67 bpm, normal axis, without underlying ischemia or injury pattern.   Echocardiogram: 09/2019: LVEF 55-60%, mild LVH, normal diastolic filling pressures, normal left atrial pressures, mildly dilated left atrium, mild MR, mild TR. Large liver cyst (6.9x7.8cm) noted. Consider dedicated imaging, if clinically indicated.   Stress Testing:  Lexiscan 09/11/2019: Nondiagnostic ECG stress. The baseline blood pressure was 80/54 mmHg and decreased to 78/54 mmHg at peak infusion, which is a  physiologic response to Intravenous Lexiscan. Patient asymptomatic. Resting EKG demonstrated normal sinus rhythm. Non-specific ST-T abnormality. Peak EKG revealed no significant ST-T change from baseline abnormality. Myocardial perfusion is normal. Stress LV EF: 52%. No previous exam available for comparison. Low risk study.    Heart Catheterization: 11/28/2019 by Dr. Evlyn Clines Patwardhan: Normal epicardial coronary arteries.  Normal LVEDP.   Carotid Duplex: 11/10/2019: Peak systolic velocities in the right bifurcation, internal, external and common carotid arteries are within normal limits. Minimal stenosis in the left internal carotid artery (minimal) with homogenous plaque.  Right vertebral artery flow is not visualized. Antegrade left vertebral artery flow.  14 - day [facility] Tele[facility]. Enrollment Period: 10/18/2019-10/31/2019 Predominant rhythm normal sinus with heart rate ranging from 45 - 121 bpm, average heart rate 69 bpm. No pauses greater than or equal to 2.5 seconds. No atrial fibrillation detected during the monitoring period.   Total supraventricular ectopic burden <1% (323 supraventricular ectopy, 16 supraventricular runs). Longest run was 22 beats in duration at 162 bpm. The fastest run was 3 beats in duration at 171 bpm. Total ventricular ectopic burden <1% (6925 ventricular ectopy, 0 ventricular pairs and 0 ventricular runs). Heart rate < 60 bpm for 27.8% of the recording. Heart rate > 100 bpm for 3.4% of the recording. No Auto triggered events.   21 Patient triggered events reviewed, not associated with any significant arrhythmia (baseline artifact in many tracings).    LABORATORY DATA:    Latest Ref Rng & Units 08/06/2020    4:02 PM 11/28/2019   12:49 PM 09/08/2019   10:01 AM  CBC  WBC 4.0 - 10.5 K/uL 9.3  9.1  7.0   Hemoglobin 13.0 - 17.0 g/dL 13.2  44.0  10.2   Hematocrit 39.0 - 52.0 % 47.8  42.3  48.8   Platelets 150.0 - 400.0 K/uL 164.0  C 193  178      C Corrected result       Latest Ref Rng & Units 06/18/2022   10:30 AM 11/24/2019    8:07 AM 10/05/2019    8:08 AM  CMP  Glucose 70 - 99 mg/dL 725  366  440   BUN 8 - 27 mg/dL 16  16  18    Creatinine 0.76 - 1.27 mg/dL 3.47  4.25  9.56   Sodium 134 - 144 mmol/L  141  141  140   Potassium 3.5 - 5.2 mmol/L 4.7  4.6  4.4   Chloride 96 - 106 mmol/L 104  104  100   CO2 20 - 29 mmol/L 21  18  22    Calcium 8.6 - 10.2 mg/dL 9.9  9.9  9.9     Lipid Panel     Component Value Date/Time   CHOL 139 09/25/2019 0813   TRIG 122 09/25/2019 0813   HDL 45 09/25/2019 0813   LDLCALC 72 09/25/2019 0813   LABVLDL 22 09/25/2019 0813   FINAL MEDICATION LIST END OF ENCOUNTER: Meds ordered this encounter  Medications   hydrALAZINE (APRESOLINE) 25 MG tablet    Sig: Take 1 tablet (25 mg total) by mouth 3 (three) times daily. Hold if SBP less than    Dispense:  270 tablet    Refill:  3    Medications Discontinued During This Encounter  Medication Reason   hydrALAZINE (APRESOLINE) 50 MG tablet         Current Outpatient Medications:    albuterol (VENTOLIN HFA) 108 (90 Base) MCG/ACT inhaler, Inhale 2 puffs into the lungs every 6 (six) hours as needed for wheezing or shortness of breath., Disp: , Rfl:    alendronate (FOSAMAX) 70 MG tablet, Take 70 mg by mouth once a week., Disp: , Rfl:    aspirin 81 MG chewable tablet, Chew 81 mg by mouth daily., Disp: , Rfl:    Calcium-Vitamin D-Vitamin K (CALCIUM + D) [phone] MG-UNT-MCG CHEW, as directed Orally once a day, Disp: , Rfl:    dutasteride (AVODART) 0.5 MG capsule, Take 0.5 mg by mouth daily., Disp: , Rfl:    hydrALAZINE (APRESOLINE) 25 MG tablet, Take 1 tablet (25 mg total) by mouth 3 (three) times daily. Hold if SBP less than , Disp: 270 tablet, Rfl: 3   levothyroxine (SYNTHROID) 137 MCG tablet, Take 137 mcg by mouth daily before breakfast. Was told on 11/06/19 by PCP not to take, Disp: , Rfl:    losartan (COZAAR) 100 MG tablet, Take 1  tablet (100 mg total) by mouth daily., Disp: 90 tablet, Rfl: 3   lovastatin (MEVACOR) 40 MG tablet, Take 40 mg by mouth daily. , Disp: , Rfl:    magnesium oxide (MAG-OX) 400 MG tablet, Take 400 mg by mouth daily., Disp: , Rfl:    meloxicam (MOBIC) 7.5 MG tablet, Take 7.5 mg by mouth daily as needed for pain. , Disp: , Rfl:    omeprazole (PRILOSEC) 40 MG capsule, Take 20 mg by mouth daily before breakfast., Disp: , Rfl:    nitroGLYCERIN (NITROSTAT) 0.4 MG SL tablet, Place 1 tablet (0.4 mg total) under the tongue every 5 (five) minutes as needed for chest pain., Disp: 30 tablet, Rfl: 3  IMPRESSION:    ICD-10-CM   1. Dizziness  R42 EKG 12-Lead    hydrALAZINE (APRESOLINE) 25 MG tablet    2. Orthostatic hypotension  I95.1 hydrALAZINE (APRESOLINE) 25 MG tablet    3. Essential hypertension, benign  I10 hydrALAZINE (APRESOLINE) 25 MG tablet    4. Primary hypertension  I10        RECOMMENDATIONS: KIVON MARCELLUS is a 88 y.o. male whose past medical history and cardiovascular risk factors include: Hypertension, hyperlipidemia, advanced age, former smoker.  Patient presents today initially for blood pressure check as he is feeling dizzy and systolic blood pressures are between 90-110 mmHg.  His initial office blood pressures were 125/71 mmHg and wanted to be seen  as a sick work in.  He denies anginal chest pain or heart failure symptoms.  He still has dyspnea which is chronic and stable.  Patient states that he checks his blood pressure about an hour after taking his medications and SBP ranges between 90-110 mmHg.  He feels lightheaded and dizziness at times.  In the past we have down titrated blood pressure medications but then he comes back stating that his blood pressures are also elevated.  Orthostatic vital signs are positive for him sitting to standing.  Will decrease hydralazine from 50 mg p.o. 3 times daily to 25 mg p.o. 3 times daily with holding parameters.  I have asked him to  check his blood pressures especially on the days when he feels lightheaded and dizzy before taking his medications.  And if SBP is less than 120 mmHg I would hold the dose and have it reevaluated at the next medication dosage.  Reemphasized importance of keeping himself hydrated, avoiding high amounts of caffeinated beverages, avoiding hot summer weather especially on days when he is feeling symptomatic, compression stockings, and avoid prolonged standing.   Orders Placed This Encounter  Procedures   EKG 12-Lead    --Continue cardiac medications as reconciled in final medication list. --Return in about 6 months (around 08/25/2023). Or sooner if needed. --Continue follow-up with your primary care physician regarding the management of your other chronic comorbid conditions.  Patient's questions and concerns were addressed to his satisfaction. He voices understanding of the instructions provided during this encounter.   This note was created using a voice recognition software as a result there may be grammatical errors inadvertently enclosed that do not reflect the nature of this encounter. Every attempt is made to correct such errors.  Tessa Lerner, [place], [name]  Pager:  [phone] Office: [phone]

## 2023-02-24 DIAGNOSIS — N3000 Acute cystitis without hematuria: Secondary | ICD-10-CM | POA: Diagnosis not present

## 2023-02-24 DIAGNOSIS — R399 Unspecified symptoms and signs involving the genitourinary system: Secondary | ICD-10-CM | POA: Diagnosis not present

## 2023-03-12 DIAGNOSIS — R399 Unspecified symptoms and signs involving the genitourinary system: Secondary | ICD-10-CM | POA: Diagnosis not present

## 2023-04-05 DIAGNOSIS — N411 Chronic prostatitis: Secondary | ICD-10-CM | POA: Diagnosis not present

## 2023-04-24 DIAGNOSIS — R300 Dysuria: Secondary | ICD-10-CM | POA: Diagnosis not present

## 2023-04-24 DIAGNOSIS — N390 Urinary tract infection, site not specified: Secondary | ICD-10-CM | POA: Diagnosis not present

## 2023-04-24 DIAGNOSIS — R102 Pelvic and perineal pain: Secondary | ICD-10-CM | POA: Diagnosis not present

## 2023-05-02 DIAGNOSIS — R350 Frequency of micturition: Secondary | ICD-10-CM | POA: Diagnosis not present

## 2023-05-02 DIAGNOSIS — N390 Urinary tract infection, site not specified: Secondary | ICD-10-CM | POA: Diagnosis not present

## 2023-05-03 ENCOUNTER — Telehealth: Payer: Self-pay | Admitting: Cardiology

## 2023-05-03 NOTE — Telephone Encounter (Signed)
Spoke with the patient who states that he has had trouble with statins in the past. He states that he had been doing fine on lovastatin for a while but recently has been noticing leg pain at night. He stopped taking the lovastatin on Saturday and has noticed some improvement. He would like to know if he can stay off of it.

## 2023-05-03 NOTE — Telephone Encounter (Signed)
Pt c/o medication issue:  1. Name of Medication:  lovastatin (MEVACOR) 40 MG tablet  2. How are you currently taking this medication (dosage and times per day)?  Once daily in the morning  3. Are you having a reaction (difficulty breathing--STAT)?   4. What is your medication issue?   Patient states this medication is causing left leg pain at night.

## 2023-05-06 NOTE — Telephone Encounter (Signed)
Spoke with the patient and advised on recommendations per Dr. Odis Hollingshead. He states that he was just going to stop taking lovastatin for a week. He is going to restart taking it tomorrow.

## 2023-05-06 NOTE — Telephone Encounter (Signed)
Sure, if he is having pain. But he needs to have his cholesterol monitored closely w/ PCP who is managing her lipids.   Chavonne Sforza [place], DO, [facility]

## 2023-05-14 ENCOUNTER — Ambulatory Visit: Payer: PPO | Admitting: Cardiology

## 2023-05-19 DIAGNOSIS — M810 Age-related osteoporosis without current pathological fracture: Secondary | ICD-10-CM | POA: Diagnosis not present

## 2023-05-19 DIAGNOSIS — M8000XA Age-related osteoporosis with current pathological fracture, unspecified site, initial encounter for fracture: Secondary | ICD-10-CM | POA: Diagnosis not present

## 2023-06-02 DIAGNOSIS — N411 Chronic prostatitis: Secondary | ICD-10-CM | POA: Diagnosis not present

## 2023-06-22 ENCOUNTER — Telehealth: Payer: Self-pay | Admitting: Cardiology

## 2023-06-22 NOTE — Telephone Encounter (Signed)
Call routed to triage d/t pt's elevated BP readings. Patient states that at last visit with Odis Hollingshead he was having issues with BP dropping to low and hydralazine was adjusted from 50mg  three times daily to 25mg  three times daily. He notes that since beginning of December his BP is trending up. He does check his BP daily. Doesn't monitor his sodium intake, but doesn't add salt to his food. He ate [employer] last night for supper-fried chicken, biscuit, and cole slaw. A little while later, had a  snack of beef jerky and orange, then had crackers and cheese prior to bed. States this morning upon waking his BP was 179/107 (roughly 7:30) and he took Losartan 100mg  and hydralazine 25mg . Asked him to recheck BP while on the phone (8:30 now) and he reports 181/103. Advised him to take another Hydralazine tablet (25mg ) at this time. Asked him to also check his pressure before he leaves home at noon and if SBP is greater than , take additional dose of Hydralazine 50mg  (two tablets) then check BP when arrives back home. Will route message to [facility] for review. It appears patient's BP is running high almost daily and needs med adjustment. Per OV note by Tolia on 02/22/23: Orthostatic vital signs are positive for him sitting to standing. Will decrease hydralazine from 50 mg p.o. 3 times daily to 25 mg p.o. 3 times daily with holding parameters.

## 2023-06-22 NOTE — Telephone Encounter (Signed)
MUST reduce salty food and eating out.  He has been having labile blood pressures for some time including having episodes of orthostasis.  Currently on hydralazine 25 mg p.o. 3 times daily. Increase hydralazine to 50 mg p.o. twice daily.   Joseph [place], DO, [facility]

## 2023-06-22 NOTE — Telephone Encounter (Signed)
Pt c/o BP issue: STAT if pt c/o blurred vision, one-sided weakness or slurred speech  1. What are your last 5 BP readings?   156/109 - 12/9 157/100 - 12/10 160/104 - 12/11  145/80 - 12/12  147/91 - 12/13  154/83 - 12/14 133/87 - 12/15 167/97 - 12/16 179/107 - 12/17    2. Are you having any other symptoms (ex. Dizziness, headache, blurred vision, passed out)? No   3. What is your BP issue?  Pt states his bp has elevated

## 2023-06-23 ENCOUNTER — Other Ambulatory Visit: Payer: Self-pay

## 2023-06-23 DIAGNOSIS — I10 Essential (primary) hypertension: Secondary | ICD-10-CM

## 2023-06-23 DIAGNOSIS — R42 Dizziness and giddiness: Secondary | ICD-10-CM

## 2023-06-23 DIAGNOSIS — I951 Orthostatic hypotension: Secondary | ICD-10-CM

## 2023-06-23 MED ORDER — HYDRALAZINE HCL 50 MG PO TABS: 50.0000 mg | ORAL_TABLET | Freq: Two times a day (BID) | ORAL | 3 refills | Status: DC

## 2023-06-23 NOTE — Progress Notes (Signed)
Spoke with pt over the phone and the pt stated he had been taking Hydralazine 25 mg BID instead of TID. Per Dr. Emelda Brothers recommendations, increased pt's Hydralazine to 50 mg BID and sent new rx to pharmacy. Pt is aware and states he will be doubling his 25 mg tablets to equal his new 50 mg dose for the time being and will the pick up his new rx when he needs to. Pt verbalized understanding of needing to decrease salt intake and eating out. Pt advised to take a BP reading approximately an hour after taking his AM and PM BP meds. Pt verbalized understanding of plan of care and stated he would call with any further questions.

## 2023-06-24 DIAGNOSIS — R399 Unspecified symptoms and signs involving the genitourinary system: Secondary | ICD-10-CM | POA: Diagnosis not present

## 2023-06-24 NOTE — Telephone Encounter (Signed)
Patient states he's already been contacted about this per Pervis Hocking, [employer] fund[employer]. Chart does show prescription has been sent in for the 50mg  with BID dosing. Says he took 50mg  last night and this morning's am blood pressure reading was 143/84. He will call us with any additional concerns.

## 2023-06-25 ENCOUNTER — Emergency Department (HOSPITAL_COMMUNITY)
Admission: EM | Admit: 2023-06-25 | Discharge: 2023-06-25 | Disposition: A | Discharge status: Other Acute Inpatient Hospital | Payer: PPO | Attending: Emergency Medicine | Admitting: Emergency Medicine

## 2023-06-25 ENCOUNTER — Encounter (HOSPITAL_COMMUNITY): Payer: Self-pay

## 2023-06-25 ENCOUNTER — Other Ambulatory Visit: Payer: Self-pay

## 2023-06-25 DIAGNOSIS — I10 Essential (primary) hypertension: Secondary | ICD-10-CM | POA: Insufficient documentation

## 2023-06-25 DIAGNOSIS — Z79899 Other long term (current) drug therapy: Secondary | ICD-10-CM | POA: Diagnosis not present

## 2023-06-25 DIAGNOSIS — Z7982 Long term (current) use of aspirin: Secondary | ICD-10-CM | POA: Insufficient documentation

## 2023-06-25 LAB — BASIC METABOLIC PANEL
Anion gap: 9 (ref 5–15)
BUN: 13 mg/dL (ref 8–23)
CO2: 22 mmol/L (ref 22–32)
Calcium: 9.9 mg/dL (ref 8.9–10.3)
Chloride: 107 mmol/L (ref 98–111)
Creatinine, Ser: 1.02 mg/dL (ref 0.61–1.24)
GFR, Estimated: >60 mL/min (ref >60)
Glucose, Bld: 111 mg/dL — ABNORMAL HIGH (ref 70–99)
Potassium: 4.3 mmol/L (ref 3.5–5.1)
Sodium: 138 mmol/L (ref 135–145)

## 2023-06-25 LAB — URINALYSIS, ROUTINE W REFLEX MICROSCOPIC
Bilirubin Urine: NEGATIVE
Glucose, UA: NEGATIVE mg/dL
Hgb urine dipstick: NEGATIVE
Ketones, ur: NEGATIVE mg/dL
Leukocytes,Ua: NEGATIVE
Nitrite: NEGATIVE
Protein, ur: NEGATIVE mg/dL
Specific Gravity, Urine: 1.015 (ref 1.005–1.030)
pH: 6 (ref 5.0–8.0)

## 2023-06-25 LAB — CBC
HCT: 49.6 % (ref 39.0–52.0)
Hemoglobin: 15.8 g/dL (ref 13.0–17.0)
MCH: 29 pg (ref 26.0–34.0)
MCHC: 31.9 g/dL (ref 30.0–36.0)
MCV: 91 fL (ref 80.0–100.0)
Platelets: 192 10*3/uL (ref 150–400)
RBC: 5.45 MIL/uL (ref 4.22–5.81)
RDW: 14.3 % (ref 11.5–15.5)
WBC: 7.9 10*3/uL (ref 4.0–10.5)
nRBC: 0 % (ref 0.0–0.2)

## 2023-06-25 LAB — CBG MONITORING, ED: Glucose-Capillary: 100 mg/dL — ABNORMAL HIGH (ref 70–99)

## 2023-06-25 NOTE — ED Provider Notes (Signed)
EMERGENCY DEPARTMENT AT [facility] Provider Note   CSN: [id number] Arrival date & time: 06/25/23  1319     History  Chief Complaint  Patient presents with   Hypertension    Joseph [name] is a 88 y.o. male.  88 yo M with a chief complaint of high blood pressure.  He tells me that a few days ago he was started on hydralazine over they increased his dose.  He said that he has been checking his blood pressure fairly frequently and he is not as that has been elevated.  He took an extra dose today and then rechecked his blood pressure and it had not improved so he went to his cardiologist office to check it and they told him that unfortunately they would not be able to see him urgently and given the option to come here for evaluation.  He said that maybe his vision is a little bit blurry he otherwise denies headaches neck pain chest pain abdominal pain denies difficulty breathing.  Denies one-sided numbness or weakness denies difficulty speech or swallowing.  He denies any other new medications denies any new supplements.   Hypertension       Home Medications Prior to Admission medications   Medication Sig Start Date End Date Taking? Authorizing Provider  albuterol (VENTOLIN HFA) 108 (90 Base) MCG/ACT inhaler Inhale 2 puffs into the lungs every 6 (six) hours as needed for wheezing or shortness of breath.    [provider]  alendronate (FOSAMAX) 70 MG tablet Take 70 mg by mouth once a week. 10/28/22   [provider]  aspirin 81 MG chewable tablet Chew 81 mg by mouth daily.    [provider]  Calcium-Vitamin D-Vitamin K (CALCIUM + D) [phone] MG-UNT-MCG CHEW as directed Orally once a day 03/18/21   [provider]  dutasteride (AVODART) 0.5 MG capsule Take 0.5 mg by mouth daily. 03/10/22   [provider]  hydrALAZINE (APRESOLINE) 50 MG tablet Take 1 tablet (50 mg total) by mouth in the morning and at bedtime. Hold if  SBP less than 06/23/23   Tolia, Sunit, DO  levothyroxine (SYNTHROID) 137 MCG tablet Take 137 mcg by mouth daily before breakfast. Was told on 11/06/19 by PCP not to take    [provider]  losartan (COZAAR) 100 MG tablet Take 1 tablet (100 mg total) by mouth daily. 06/08/22 06/03/23  Nori Riis, NP  lovastatin (MEVACOR) 40 MG tablet Take 40 mg by mouth daily. 10/31/17   [provider]  magnesium oxide (MAG-OX) 400 MG tablet Take 400 mg by mouth daily.    [provider]  meloxicam (MOBIC) 7.5 MG tablet Take 7.5 mg by mouth daily as needed for pain.  08/10/19   [provider]  nitroGLYCERIN (NITROSTAT) 0.4 MG SL tablet Place 1 tablet (0.4 mg total) under the tongue every 5 (five) minutes as needed for chest pain. 09/08/19 11/12/22  Patwardhan, Anabel Bene, MD  omeprazole (PRILOSEC) 40 MG capsule Take 20 mg by mouth daily before breakfast.    [provider]      Allergies    Ciprofloxacin, Ciprocin-fluocin-procin [fluocinolone acetonide], Ezetimibe, Fluocinolone acetonide, Imiquimod, Quinolones, Statins, Imiquimod, Penicillin v potassium, and Penicillins    Review of Systems   Review of Systems  Physical Exam Updated Vital Signs BP (!) 162/70   Pulse 75   Temp 97.6 F (36.4 C) (Oral)   Resp 16   Ht 6' (1.829 m)  Wt 79.4 kg   SpO2 98%   BMI 23.73 kg/m  Physical Exam Vitals and nursing note reviewed.  Constitutional:      Appearance: He is well-developed.  HENT:     Head: Normocephalic and atraumatic.  Eyes:     Pupils: Pupils are equal, round, and reactive to light.  Neck:     Vascular: No JVD.  Cardiovascular:     Rate and Rhythm: Normal rate and regular rhythm.     Heart sounds: No murmur heard.    No friction rub. No gallop.  Pulmonary:     Effort: No respiratory distress.     Breath sounds: No wheezing.  Abdominal:     General: There is no distension.     Tenderness: There is no abdominal tenderness. There is no  guarding or rebound.  Musculoskeletal:        General: Normal range of motion.     Cervical back: Normal range of motion and neck supple.  Skin:    Coloration: Skin is not pale.     Findings: No rash.  Neurological:     Mental Status: He is alert and oriented to person, place, and time.     GCS: GCS eye subscore is 4. GCS verbal subscore is 5. GCS motor subscore is 6.     Cranial Nerves: Cranial nerves 2-12 are intact.     Sensory: Sensation is intact.     Motor: Motor function is intact.     Coordination: Coordination is intact.     Comments: Benign neurologic exam.  Ambulates without issue.  Psychiatric:        Behavior: Behavior normal.     ED Results / Procedures / Treatments   Labs (all labs ordered are listed, but only abnormal results are displayed) Labs Reviewed  BASIC METABOLIC PANEL - Abnormal; Notable for the following components:      Result Value   Glucose, Bld 111 (*)    All other components within normal limits  URINALYSIS, ROUTINE W REFLEX MICROSCOPIC - Abnormal; Notable for the following components:   APPearance HAZY (*)    All other components within normal limits  CBG MONITORING, ED - Abnormal; Notable for the following components:   Glucose-Capillary 100 (*)    All other components within normal limits  CBC    EKG EKG Interpretation Date/Time:  Friday June 25 2023 14:21:01 EST Ventricular Rate:  81 PR Interval:  152 QRS Duration:  74 QT Interval:  386 QTC Calculation: 448 R Axis:   75  Text Interpretation: Normal sinus rhythm Normal ECG Baseline wander TECHNICALLY DIFFICULT Otherwise no significant change Confirmed by Melene Plan [id number]) on 06/25/2023 6:44:29 PM  Radiology No results found.  Procedures Procedures    Medications Ordered in ED Medications - No data to display  ED Course/ Medical Decision Making/ A&P                                 Medical Decision Making Amount and/or Complexity of Data Reviewed Labs:  ordered.   88 yo M with a chief complaint of high blood pressure.  Does like he is checked this multiple times today and is worried that it is elevated.  He described initially no symptoms when asked multiple ways he eventually said that maybe his vision has been a little bit blurry.  Has a benign neurologic exam otherwise.  Will have him follow-up with his family  doctor in the office.  Patient asymptomatic with no noted s/s of end organ damage.  No chest pain, diaphoresis, nausea or other acs symptoms.  No headache or neurologic complaints,  no unequal pulses, normal pulse ox without rales or sob.  Feel this is unlikely to be a Hypertensive Emergency and recent studies suggest no benefit for inpatient admission.  There are also no studies to my knowledge suggesting that patients with hypertensive urgency have increased risk for end organ disease. In fact there has been a study recently that would suggest that the rapid change can induce harm.  The [employer] policy statement on asymptomatic hypertension does not  recommend routing ED medical intervention. The patient will follow up closely with their PCP.  Compliance with their medication stressed.   The management of elevated blood pressure in the acute care setting: a scientific statement from the American Heart Association Bress AP, Jena Gauss, Flack JM, et al. Hypertension. May 2024. doi: 10.1161/HYP.0000000000000238  Chester Holstein, Christell Constant EH, et al. Characteristics and outcomes of patients presenting with hypertensive urgency in the office setting. JAMA Intern Med. 2016 Jul 1; 176(7): 981-8.   Cerebrovascular risks with rapid blood pressure lowering in the absence of hypertensive emergency Laverle Hobby, Ronalee Red, et al. Am J Emerg Med. 2019;37(6):1073-1077.  7:56 PM: I have discussed the diagnosis/risks/treatment options with the patient.  Evaluation and diagnostic testing in the emergency department does  not suggest an emergent condition requiring admission or immediate intervention beyond what has been performed at this time.  They will follow up with PCP. We also discussed returning to the ED immediately if new or worsening sx occur. We discussed the sx which are most concerning (e.g., sudden worsening pain, fever, inability to tolerate by mouth) that necessitate immediate return. Medications administered to the patient during their visit and any new prescriptions provided to the patient are listed below.  Medications given during this visit Medications - No data to display   The patient appears reasonably screen and/or stabilized for discharge and I doubt any other medical condition or other [facility] requiring further screening, evaluation, or treatment in the ED at this time prior to discharge.           Final Clinical Impression(s) / ED Diagnoses Final diagnoses:  Asymptomatic hypertension    Rx / DC Orders ED Discharge Orders     None         Melene Plan, DO 06/25/23 1956

## 2023-06-25 NOTE — ED Triage Notes (Signed)
Pt states his blood pressure was up at home, pt states BP was 200/160. When pt went to cardiology office, he was told to come to ED for evaluation. Pt denies pain, shortness of breath, dizziness, or weakness.

## 2023-06-25 NOTE — ED Provider Triage Note (Signed)
Emergency Medicine Provider Triage Evaluation Note  Joseph [name] , a 88 y.o. male  was evaluated in triage.  Pt complains of hypertension. States that he took his normal 50 mg of hydralazine this morning and checked his blood pressure which he does every day. Noted that his readings were in the 200s, subsequently took 50 mg more hydralazine with no improvement. Went to his pcp and his cardiogist who recommended he come here as they did not have any appointments today. Presents for same. States he feels completely asymptomatic. Denies headaches, vision changes, chest pain, shortness of breath, nausea, or vomiting.   Review of Systems  Positive:  Negative:   Physical Exam  BP (!) 169/91   Pulse 87   Temp (!) 97.4 F (36.3 C)   Resp 17   Ht 6' (1.829 m)   Wt 79.4 kg   SpO2 100%   BMI 23.73 kg/m  Gen:   Awake, no distress   Resp:  Normal effort  MSK:   Moves extremities without difficulty  Other:    Medical Decision Making  Medically screening exam initiated at 3:10 PM.  Appropriate orders placed.  [name] was informed that the remainder of the evaluation will be completed by another provider, this initial triage assessment does not replace that evaluation, and the importance of remaining in the ED until their evaluation is complete.     Silva Bandy, PA-C 06/25/23 1513

## 2023-06-25 NOTE — Discharge Instructions (Signed)
Follow up with your family doc and cardiologist in the morning.   Please return for headache chest pain difficulty breathing abdominal pain one-sided numbness or weakness or difficulty speech or swallowing.

## 2023-06-28 ENCOUNTER — Other Ambulatory Visit: Payer: Self-pay

## 2023-06-28 ENCOUNTER — Telehealth: Payer: Self-pay | Admitting: Cardiology

## 2023-06-28 DIAGNOSIS — I10 Essential (primary) hypertension: Secondary | ICD-10-CM

## 2023-06-28 MED ORDER — VALSARTAN 320 MG PO TABS: 320.0000 mg | ORAL_TABLET | Freq: Every day | ORAL | 3 refills | Status: DC

## 2023-06-28 NOTE — Telephone Encounter (Signed)
Patient is asking that the nurse gives him a call. Please advise

## 2023-06-28 NOTE — Telephone Encounter (Signed)
Spoke with pt over the phone and explained that after the medication change, we will need to do a repeat BMP in 1 week. Pt verbalized understanding and had no further questions. Order placed and released.

## 2023-06-28 NOTE — Progress Notes (Signed)
Discontinued Losartan and switching pt to Valsartan 320 mg daily per Dr. Izora Ribas recommendations (DOD 12/23). Dr. Odis Hollingshead agreed with recommendations. Pt aware of medication change and BMP ordered to be completed in 1 week.

## 2023-07-05 ENCOUNTER — Other Ambulatory Visit: Payer: Self-pay

## 2023-07-05 ENCOUNTER — Telehealth: Payer: Self-pay | Admitting: Cardiology

## 2023-07-05 DIAGNOSIS — I951 Orthostatic hypotension: Secondary | ICD-10-CM

## 2023-07-05 DIAGNOSIS — I10 Essential (primary) hypertension: Secondary | ICD-10-CM

## 2023-07-05 DIAGNOSIS — R42 Dizziness and giddiness: Secondary | ICD-10-CM

## 2023-07-05 MED ORDER — HYDRALAZINE HCL 50 MG PO TABS: 50.0000 mg | ORAL_TABLET | Freq: Three times a day (TID) | ORAL | Status: DC

## 2023-07-05 NOTE — Telephone Encounter (Signed)
Patient wants a call back from RN Harlow Ohms to discuss blood pressure and medication change.

## 2023-07-05 NOTE — Progress Notes (Signed)
Spoke with pt over the phone and he stated that even with the Hydralazine 50 mg BID and the Valsartan 320 mg once daily, his SBP in the AM is still in the 160s-180s. Dr. Odis Hollingshead recommended we adjust the frequency of Hydralazine to TID- rx has been updated and pt is aware of new administration changes. Pt advised to contact PCP office and make them aware of these BP issues with the medication changes so they can evaluate as they see appropriate as well.

## 2023-07-05 NOTE — Telephone Encounter (Signed)
Pt returning call

## 2023-07-05 NOTE — Telephone Encounter (Signed)
Unable to reach pt over the phone. LMTCB.

## 2023-07-06 DIAGNOSIS — I10 Essential (primary) hypertension: Secondary | ICD-10-CM | POA: Diagnosis not present

## 2023-07-06 LAB — BASIC METABOLIC PANEL
BUN/Creatinine Ratio: 18 (ref 10–24)
BUN: 21 mg/dL (ref 8–27)
CO2: 23 mmol/L (ref 20–29)
Calcium: 10.1 mg/dL (ref 8.6–10.2)
Chloride: 105 mmol/L (ref 96–106)
Creatinine, Ser: 1.16 mg/dL (ref 0.76–1.27)
Glucose: 93 mg/dL (ref 70–99)
Potassium: 4.3 mmol/L (ref 3.5–5.2)
Sodium: 141 mmol/L (ref 134–144)
eGFR: 61 mL/min/{1.73_m2} (ref >59)

## 2023-07-14 DIAGNOSIS — H43812 Vitreous degeneration, left eye: Secondary | ICD-10-CM | POA: Diagnosis not present

## 2023-07-14 DIAGNOSIS — H353132 Nonexudative age-related macular degeneration, bilateral, intermediate dry stage: Secondary | ICD-10-CM | POA: Diagnosis not present

## 2023-07-14 DIAGNOSIS — H33321 Round hole, right eye: Secondary | ICD-10-CM | POA: Diagnosis not present

## 2023-07-14 DIAGNOSIS — H3562 Retinal hemorrhage, left eye: Secondary | ICD-10-CM | POA: Diagnosis not present

## 2023-07-27 DIAGNOSIS — H26491 Other secondary cataract, right eye: Secondary | ICD-10-CM | POA: Diagnosis not present

## 2023-08-10 ENCOUNTER — Telehealth: Payer: Self-pay | Admitting: Cardiology

## 2023-08-10 DIAGNOSIS — I10 Essential (primary) hypertension: Secondary | ICD-10-CM

## 2023-08-10 NOTE — Telephone Encounter (Signed)
 Pt stated that for about a month his BP when he wakes up is high (readings for the past couple of days are below) and then drops to 130s-140s SBP about 1 hr after taking morning medications. Pt asked if he takes BP after medications consistently and he only had a couple of readings from last week. Pt advised to retake BP in about an hour (he took AM meds around 7:50 AM) and call our office with the readings. Pt told we will send this information to Dr. Michele and see what he advises.   Saturday 173/97   Sunday 154/93   Monday 144/86   Today 175/111

## 2023-08-10 NOTE — Telephone Encounter (Signed)
Patient states he was told to call back. Please advise

## 2023-08-10 NOTE — Telephone Encounter (Signed)
Pt stated repeat BP about 2 hours after AM medications was 134/75. Pt told we would send this information to Dr. Odis Hollingshead to review.

## 2023-08-10 NOTE — Telephone Encounter (Signed)
 Pt c/o medication issue:  1. Name of Medication: Valsartan  and Hydralazine   2. How are you currently taking this medication (dosage and times per day)?   3. Are you having a reaction (difficulty breathing--STAT)?   4. What is your medication issue? Patient says he thinks these medicine might need to be adjusted

## 2023-08-13 NOTE — Telephone Encounter (Signed)
 Are the below reading after taking his morning meds or when he wakes up?  Saturday 173/97   Sunday 154/93   Monday 144/86   Today 175/111  Olinda Bertrand, DO, [facility]

## 2023-08-16 ENCOUNTER — Ambulatory Visit: Payer: Self-pay | Admitting: Cardiology

## 2023-08-19 NOTE — Telephone Encounter (Signed)
After his morning meds the BP range is w/n acceptable range. Given his hx of orthostatic hypotension will hold on up titration of medications. Since he his numbers are elevated when he wakes up I would like him to get a sleep study to rule out central / obstructive sleep apnea. See if he wants to proceed w/ itamar or referral to sleep medicine.   If standing SBP are consistently greater than >150 mmHG have him call us back w/ a trend of BP readings.   Dr. Odis Hollingshead

## 2023-08-20 NOTE — Telephone Encounter (Signed)
Spoke with pt and went over Dr. Emelda Brothers recommendations. Pt verbalized understanding of all recommendations and information discussed. Joseph [name] sleep study order has been placed.

## 2023-08-20 NOTE — Addendum Note (Signed)
Addended by: Cherylann Banas on: 08/20/2023 04:45 PM   Modules accepted: Orders

## 2023-08-23 ENCOUNTER — Encounter (INDEPENDENT_AMBULATORY_CARE_PROVIDER_SITE_OTHER): Payer: Self-pay | Admitting: Cardiology

## 2023-08-23 ENCOUNTER — Telehealth: Payer: Self-pay

## 2023-08-23 ENCOUNTER — Telehealth: Payer: Self-pay | Admitting: *Deleted

## 2023-08-23 DIAGNOSIS — G4733 Obstructive sleep apnea (adult) (pediatric): Secondary | ICD-10-CM

## 2023-08-23 NOTE — Telephone Encounter (Signed)
Pt will come by the office today to set up Itamar sleep study. Does not look like a stop bang was done. Stop bang will need to be completed at set up.   I will also check with sleep coordinator if pt has been approved. If so I will be able to provide the PIN# as well at set up.   Pt coming by the office 08/23/23 2 pm.

## 2023-08-23 NOTE — Telephone Encounter (Signed)
Pt came in the office today for Itamar set up. Pt has been approved and has been given the PIN# [id number].   Patient agreement reviewed and signed on 08/23/2023.  WatchPAT issued to patient on 08/23/2023 by Danielle Rankin, CMA. Patient aware to not open the WatchPAT box until contacted with the activation PIN. Patient profile initialized in Cloud[employer] on 08/23/2023 by Danielle Rankin, CMA. Device serial number: [id number]  Please list Reason for Call as Advice Only and type "WatchPAT issued to patient" in the comment box.

## 2023-08-23 NOTE — Telephone Encounter (Signed)
-----   Message from Nurse Rolanda Lundborg sent at 08/23/2023 10:13 AM EST ----- Forwarding to Danielle Rankin so she can provide the pt with a WatchPAT One-HST device. Larita Fife ----- Message ----- From: Cherylann Banas, RN Sent: 08/20/2023   4:47 PM EST To: Reesa Chew, CMA; Cv Div Sleep Studies  Hey! Order placed for pt to have an Itamar sleep study. Can someone reach out to him to explain how to get the watch and the process? Thanks!  Harlow Ohms, RN

## 2023-08-23 NOTE — Telephone Encounter (Signed)
**Note De-identified Mateja Dier Obfuscation** -----  **Note De-Identified Satrina Magallanes Obfuscation** Message from Nurse Zelphia Cairo sent at 08/20/2023  4:46 PM EST ----- Hey! Order placed for pt to have an Itamar sleep study. Can someone reach out to him to explain how to get the watch and the process? Thanks!  Harlow Ohms, RN

## 2023-08-23 NOTE — Telephone Encounter (Signed)
**Note De-Identified Nattalie Santiesteban Obfuscation** Ordering provider: Dr Odis Hollingshead Associated diagnoses: HTN-I10 and Orthostatic hypotension-I95.1 3.  WatchPAT PA obtained on 08/23/2023 by Ansen Sayegh, Lorelle Formosa, LPN. Authorization: Per Trula Ore with HTA, PA is not required for CPT Code: 95800-Itamar-HST 4.  Patient notified of PIN ([id number]) on 08/23/2023 Emmauel Hallums Notification Method: phone and per the pts request I did call his number back and left the Pin # in a VM message as well.  Phone note routed to covering staff for follow-up.

## 2023-08-23 NOTE — Telephone Encounter (Signed)
Patient Name:         DOB:       Height:     Weight:  Office Name:         Referring Provider:  Today's Date:  Date:   STOP BANG RISK ASSESSMENT S (snore) Have you been told that you snore?     NO   T (tired) Are you often tired, fatigued, or sleepy during the day?   NO  O (obstruction) Do you stop breathing, choke, or gasp during sleep? NO   P (pressure) Do you have or are you being treated for high blood pressure? YES   B (BMI) Is your body index greater than 35 kg/m? NO   A (age) Are you 89 years old or older? YES  N (neck) Do you have a neck circumference greater than 16 inches?   NO   G (gender) Are you a male? YES   TOTAL STOP/BANG "YES" ANSWERS 3                                                                       For Office Use Only              Procedure Order Form    YES to 3+ Stop Bang questions OR two clinical symptoms - patient qualifies for WatchPAT (CPT 95800)             Clinical Notes: Will consult Sleep Specialist and refer for management of therapy due to patient increased risk of Sleep Apnea. Ordering a sleep study due to the following two clinical symptoms: Excessive daytime sleepiness G47.10 / Gastroesophageal reflux K21.9 / Nocturia R35.1 / Morning Headaches G44.221 / Difficulty concentrating R41.840 / Memory problems or poor judgment G31.84 / Personality changes or irritability R45.4 / Loud snoring R06.83 / Depression F32.9 / Unrefreshed by sleep G47.8 / Impotence N52.9 / History of high blood pressure R03.0 / Insomnia G47.00    I understand that I am proceeding with a home sleep apnea test as ordered by my treating physician. I understand that untreated sleep apnea is a serious cardiovascular risk factor and it is my responsibility to perform the test and seek management for sleep apnea. I will be contacted with the results and be managed for sleep apnea by a local sleep physician. I will be receiving equipment and further instructions from [facility]. I shall promptly ship back the equipment via the included mailing label. I understand my insurance will be billed for the test and as the patient I am responsible for any insurance related out-of-pocket costs incurred. I have been provided with written instructions and can call for additional video or telephonic instruction, with 24-hour availability of qualified personnel to answer any questions: Patient Help Desk [phone].  Patient Signature ______________________________________________________   Date______________________ Patient Telemedicine Verbal Consent

## 2023-08-26 ENCOUNTER — Ambulatory Visit: Payer: PPO | Attending: Cardiology

## 2023-08-26 ENCOUNTER — Telehealth: Payer: Self-pay

## 2023-08-26 DIAGNOSIS — I10 Essential (primary) hypertension: Secondary | ICD-10-CM

## 2023-08-26 NOTE — Telephone Encounter (Signed)
-----   Message from [facility] sent at 08/25/2023 11:18 PM EST ----- Regarding: RE: BLOOD PRESSURE READINGS Can you refer him to pharm D to monitor his BP readings, make sure his home cuff is accurate, will await sleep study results, off note he has had symptomatic hypotension in the past so I would make changes slowly.   Tessa Lerner, DO, [facility] ----- Message ----- From: Cherylann Banas, RN Sent: 08/23/2023   5:40 PM EST To: Tessa Lerner, DO Subject: FW: BLOOD PRESSURE READINGS                    FYI for the recent BP readings ----- Message ----- From: Tarri Fuller, CMA Sent: 08/23/2023   2:43 PM EST To: Cherylann Banas, RN Subject: BLOOD PRESSURE READINGS                        Hi Payton,   I set this pt up today with the Itamar that Dr. Odis Hollingshead ordered. Pt gave me some BP readings for Dr. Odis Hollingshead as well.     2/15:  AM 153/95           ABOUT 1-2 HOURS LATER PER PT: 117/71  2/16: AM 160/99           ABOUT 1-2 HOURS LATER PER PT: 150/73  2/17: AM 174/106         ABOUT 1-2 HOURS LATER PER PT: 126/71

## 2023-08-26 NOTE — Procedures (Signed)
   SLEEP STUDY REPORT Patient Information Study Date: 08/23/2023 Patient Name: Joseph [name] Patient ID: [id number] Birth Date: [birth year] Age: 89 Gender: Male BMI: 23.6 (W=174 lb, H=6' 0'') Stopbang: 3 Referring Physician: Tessa Lerner, DO  TEST DESCRIPTION: Home sleep apnea testing was completed using the WatchPat, a Type 1 device, utilizing peripheral arterial tonometry (PAT), chest movement, actigraphy, pulse oximetry, pulse rate, body position and snore. AHI was calculated with apnea and hypopnea using valid sleep time as the denominator. RDI includes apneas, hypopneas, and RERAs. The data acquired and the scoring of sleep and all associated events were performed in accordance with the recommended standards and specifications as outlined in the AASM Manual for the Scoring of Sleep and Associated Events 2.2.0 (2015).  FINDINGS:  1. Severe Obstructive Sleep Apnea with AHI 32.5/hr.  2. No significant Central Sleep Apnea with pAHIc 6.4/hr.  3. Oxygen desaturations as low as 89%.  4. Severe snoring was present. O2 sats were < 88% for 0 min.  5. Total sleep time was 6 hrs and 35 min.  6. 15.7% of total sleep time was spent in REM sleep.  7. Normal sleep onset latency at 19 min.  8. Shortened REM sleep onset latency at 35 min.  9. Total awakenings were 17. 10. Arrhythmia detection: None  DIAGNOSIS: Severe Obstructive Sleep Apnea (G47.33)  RECOMMENDATIONS: 1. Clinical correlation of these findings is necessary. The decision to treat obstructive sleep apnea (OSA) is usually based on the presence of apnea symptoms or the presence of associated medical conditions such as Hypertension, Congestive Heart Failure, Atrial Fibrillation or Obesity. The most common symptoms of OSA are snoring, gasping for breath while sleeping, daytime sleepiness and fatigue.  2. Initiating apnea therapy is recommended given the presence of symptoms and/or associated conditions. Recommend proceeding with  one of the following:   a. Auto-CPAP therapy with a pressure range of 5-20cm H2O.   b. An oral appliance (OA) that can be obtained from certain dentists with expertise in sleep medicine. These are primarily of use in non-obese patients with mild and moderate disease.   c. An ENT consultation which may be useful to look for specific causes of obstruction and possible treatment options.   d. If patient is intolerant to PAP therapy, consider referral to ENT for evaluation for hypoglossal nerve stimulator.  3. Close follow-up is necessary to ensure success with CPAP or oral appliance therapy for maximum benefit .  4. A follow-up oximetry study on CPAP is recommended to assess the adequacy of therapy and determine the need for supplemental oxygen or the potential need for Bi-level therapy. An arterial blood gas to determine the adequacy of baseline ventilation and oxygenation should also be considered.  5. Healthy sleep recommendations include: adequate nightly sleep (normal 7-9 hrs/night), avoidance of caffeine after noon and alcohol near bedtime, and maintaining a sleep environment that is cool, dark and quiet.  6. Weight loss for overweight patients is recommended. Even modest amounts of weight loss can significantly improve the severity of sleep apnea.  7. Snoring recommendations include: weight loss where appropriate, side sleeping, and avoidance of alcohol before bed.  8. Operation of motor vehicle should be avoided when sleepy.  Signature: Armanda Magic, MD; [facility]; Diplomat, American Board of Sleep Medicine Electronically Signed: 08/26/2023 12:35:43 PM

## 2023-08-26 NOTE — Telephone Encounter (Signed)
Spoke with pt and explained Dr. Emelda Brothers recommendations to refer the pt PharmD for BP management. Pt verbalized understanding and had no further questions at this time. Explained to pt that someone will be reaching out to him to schedule the appt.

## 2023-09-01 ENCOUNTER — Telehealth: Payer: Self-pay | Admitting: *Deleted

## 2023-09-01 NOTE — Telephone Encounter (Signed)
-----   Message from Armanda Magic sent at 08/26/2023 12:37 PM EST ----- Please let patient know that they have sleep apnea.  Recommend therapeutic CPAP titration for treatment of patient's sleep disordered breathing.

## 2023-09-01 NOTE — Telephone Encounter (Addendum)
 The patient has been notified of the result and verbalized understanding.  All questions (if any) were answered. Patient wants a OV with the provider to discuss his results. Latrelle Dodrill, CMA 09/01/2023 4:53 PM

## 2023-09-06 ENCOUNTER — Telehealth: Payer: Self-pay | Admitting: Cardiology

## 2023-09-06 NOTE — Telephone Encounter (Signed)
 Patient is requesting to speak directly with a  Joseph [name] to discuss BP readings.

## 2023-09-07 NOTE — Telephone Encounter (Signed)
 Pt called with BP readings from the last couple of days for [place] to see how they have been trending:  Saturday 3/1: Before meds- 146/91, after meds- 103/68 Sunday 3/2: Before meds- 143/92, after meds- 121/75 Monday 3/3: Before meds- 139/91, after meds- 128/74 Tuesday 3/4: Before meds- 139/98, after meds- 131/74

## 2023-09-08 NOTE — Telephone Encounter (Signed)
 They are acceptable.   Joseph Sabree Delta, DO, [facility]

## 2023-09-10 NOTE — Telephone Encounter (Signed)
 Spoke with pt over the phone and he verified that he has completed the [place] sleep study.

## 2023-09-14 NOTE — Telephone Encounter (Signed)
 Attempted to call pt back, unable to reach them. LMTCB.

## 2023-09-14 NOTE — Telephone Encounter (Signed)
Patient is asking that you give him a call back. Please advise

## 2023-09-17 ENCOUNTER — Telehealth: Payer: Self-pay | Admitting: *Deleted

## 2023-09-17 ENCOUNTER — Encounter: Payer: Self-pay | Admitting: Cardiology

## 2023-09-17 ENCOUNTER — Ambulatory Visit: Admitting: Cardiology

## 2023-09-17 VITALS — BP 180/106 | Ht 72.0 in | Wt 175.0 lb

## 2023-09-17 DIAGNOSIS — G4733 Obstructive sleep apnea (adult) (pediatric): Secondary | ICD-10-CM | POA: Insufficient documentation

## 2023-09-17 NOTE — Progress Notes (Signed)
 This encounter was created in error - please disregard.

## 2023-09-17 NOTE — Telephone Encounter (Signed)
  Patient Consent for Virtual Visit         Joseph DELAUGHTER has provided verbal consent on 09/17/2023 for a virtual visit (video or telephone).   CONSENT FOR VIRTUAL VISIT FOR:  Joseph [name]  By participating in this virtual visit I agree to the following:  I hereby voluntarily request, consent and authorize [facility] and its employed or contracted physicians, physician assistants, nurse practitioners or other licensed health care professionals (the Practitioner), to provide me with telemedicine health care services (the "Services") as deemed necessary by the treating Practitioner. I acknowledge and consent to receive the Services by the Practitioner via telemedicine. I understand that the telemedicine visit will involve communicating with the Practitioner through live audiovisual communication technology and the disclosure of certain medical information by electronic transmission. I acknowledge that I have been given the opportunity to request an in-person assessment or other available alternative prior to the telemedicine visit and am voluntarily participating in the telemedicine visit.  I understand that I have the right to withhold or withdraw my consent to the use of telemedicine in the course of my care at any time, without affecting my right to future care or treatment, and that the Practitioner or I may terminate the telemedicine visit at any time. I understand that I have the right to inspect all information obtained and/or recorded in the course of the telemedicine visit and may receive copies of available information for a reasonable fee.  I understand that some of the potential risks of receiving the Services via telemedicine include:  Delay or interruption in medical evaluation due to technological equipment failure or disruption; Information transmitted may not be sufficient (e.g. poor resolution of images) to allow for appropriate medical decision making by the  Practitioner; and/or  In rare instances, security protocols could fail, causing a breach of personal health information.  Furthermore, I acknowledge that it is my responsibility to provide information about my medical history, conditions and care that is complete and accurate to the best of my ability. I acknowledge that Practitioner's advice, recommendations, and/or decision may be based on factors not within their control, such as incomplete or inaccurate data provided by me or distortions of diagnostic images or specimens that may result from electronic transmissions. I understand that the practice of medicine is not an exact science and that Practitioner makes no warranties or guarantees regarding treatment outcomes. I acknowledge that a copy of this consent can be made available to me via my patient portal [facility] MyChart), or I can request a printed copy by calling the office of [facility].    I understand that my insurance will be billed for this visit.   I have read or had this consent read to me. I understand the contents of this consent, which adequately explains the benefits and risks of the Services being provided via telemedicine.  I have been provided ample opportunity to ask questions regarding this consent and the Services and have had my questions answered to my satisfaction. I give my informed consent for the services to be provided through the use of telemedicine in my medical care

## 2023-09-21 ENCOUNTER — Other Ambulatory Visit: Payer: Self-pay | Admitting: Internal Medicine

## 2023-09-21 DIAGNOSIS — I10 Essential (primary) hypertension: Secondary | ICD-10-CM

## 2023-09-28 DIAGNOSIS — H00012 Hordeolum externum right lower eyelid: Secondary | ICD-10-CM | POA: Diagnosis not present

## 2023-10-12 ENCOUNTER — Encounter: Payer: Self-pay | Admitting: Pharmacist

## 2023-10-12 ENCOUNTER — Ambulatory Visit: Payer: PPO | Attending: Cardiology | Admitting: Pharmacist

## 2023-10-12 VITALS — BP 120/62 | HR 71

## 2023-10-12 DIAGNOSIS — I10 Essential (primary) hypertension: Secondary | ICD-10-CM | POA: Diagnosis not present

## 2023-10-12 NOTE — Assessment & Plan Note (Signed)
 Assessment: BP is controlled in office BP 120/62 mmHg heart rate 71 (goal<130/80) Home BP ~140-150/75-80; home BP monitor never validated  Tolerates current BP meds well without any side effects Denies palpitation, chest pain, headaches,or swelling SOB when active, has COPD  Review correct steps for how to check BP at home   Follows low salt diet and walks 1 mile outside when weather permits    Plan:  No medications changes  Continue taking valsartan 320 mg daily at 11 pm , hydralazine 50 mg three times daily hold if SBP <120 (8 am, 3 pm and 11 pm)  Patient to keep record of BP readings with heart rate and report to [place] at the next visit Patient to see PharmD in 8 weeks for follow up  Follow up lab(s) : none

## 2023-10-12 NOTE — Progress Notes (Signed)
 Patient ID: Joseph [name]                 DOB: [birth year]                      MRN: [id number]      HPI: Joseph [name] is a 89 y.o. male referred by Dr. Odis Hollingshead to HTN clinic. PMH is significant for hypertension, orthostatic hypotension, OSA, COPD, HLD.  Patient BP medications adjusted various time due low and high BP home readings. Patient was referred to hypertension clinic.  Patient presented today for hypertension clinic reports he is able to tolerate current BP meds without any problem. He checks BP at home as soon as he wakes up and take his hydralazine 50 mg dose around 8 am and then mid day dose around 3 pm and last dose at 11 pm. He takes valsartan 320 mg at 11 pm. He was on amlodipine. It was d/c'd does not know the reason for discontinuation. He follows low salt diet. Home BP monitor 2-38 years old and never validated. He does not get any symptoms when his BP is elevated. Denies palpitation, chest pain swelling, headache. SOB only when active that is due to COPD.  SOB limits exercise capacity. On good day he goes for walk out side - around 1 mile takes 20-25 min   Current HTN meds: valsartan 320 mg daily at 11 pm , hydralazine 50 mg three times daily hold if SBP <120 (8 am, 3 pm and 11 pm)  Previously tried: amlodipine - does not recall reason for discontinuation, denies swelling from it  BP goal: <140/90 given advance age  CrCl : 50 mL/min    Social History:  Alcohol: none  Smoking : none  Diet: eating home cooked low salt meals, selecting low or no salt added food option.   Exercise:  Walk outside when weather permits - 1 mile (about 20-25 min) SOB limits exercise capacity   Home BP readings: ~ 140-150/ 74-80    Wt Readings from Last 3 Encounters:  09/17/23 175 lb (79.4 kg)  06/25/23 175 lb (79.4 kg)  02/22/23 171 lb (77.6 kg)   BP Readings from Last 3 Encounters:  10/12/23 120/62  09/17/23 (!) 180/106  06/25/23 (!) 162/70   Pulse Readings from Last 3 Encounters:   10/12/23 71  06/25/23 75  02/22/23 74    Renal function: CrCl cannot be calculated (Patient's most recent lab result is older than the maximum 21 days allowed.).  Past Medical History:  Diagnosis Date   Arthritis    COPD (chronic obstructive pulmonary disease) (HCC)    Dyspnea    on exertion   Hyperlipidemia    Hypertension    Macular pucker, right eye 01/11/2020   The nature of macular pucker (epiretinal membrane ERM) was discussed with the patient as well as threshold criteria for vitrectomy surgery. I explained that in rare cases another surgery is needed to actually remove a second wrinkle should it regrow.  Most often, the epiretinal membrane and underlying wrinkled internal limiting membrane are removed with the first surgery, to accomplish the goals.    OSA (obstructive sleep apnea)    severe OSA with AHI of 32.5/hr and CSA of 6.4/hr.   Pneumothorax, spontaneous, tension 1960   SOB (shortness of breath) on exertion     Current Outpatient Medications on File Prior to Visit  Medication Sig Dispense Refill   alendronate (FOSAMAX) 70 MG tablet Take 70 mg  by mouth once a week.     albuterol (VENTOLIN HFA) 108 (90 Base) MCG/ACT inhaler Inhale 2 puffs into the lungs every 6 (six) hours as needed for wheezing or shortness of breath.     aspirin 81 MG chewable tablet Chew 81 mg by mouth daily.     dutasteride (AVODART) 0.5 MG capsule Take 0.5 mg by mouth daily.     hydrALAZINE (APRESOLINE) 50 MG tablet Take 1 tablet (50 mg total) by mouth 3 (three) times daily. Hold if SBP less than     levothyroxine (SYNTHROID) 137 MCG tablet Take 137 mcg by mouth daily before breakfast. Was told on 11/06/19 by PCP not to take     lovastatin (MEVACOR) 40 MG tablet Take 40 mg by mouth daily.     magnesium oxide (MAG-OX) 400 MG tablet Take 400 mg by mouth daily.     meloxicam (MOBIC) 7.5 MG tablet Take 7.5 mg by mouth daily as needed for pain.      nitroGLYCERIN (NITROSTAT) 0.4 MG SL tablet  Place 1 tablet (0.4 mg total) under the tongue every 5 (five) minutes as needed for chest pain. 30 tablet 3   omeprazole (PRILOSEC) 40 MG capsule Take 20 mg by mouth daily before breakfast.     valsartan (DIOVAN) 320 MG tablet TAKE 1 TABLET BY MOUTH EVERY DAY 90 tablet 1   No current facility-administered medications on file prior to visit.    Allergies  Allergen Reactions   Ciprofloxacin Anaphylaxis and Other (See Comments)    Caused sores & blisters and a trip to the hospital- was also taking Aldara at the same time, however  Other Reaction(s): Trudie Buckler Syndrome   Ciprocin-Fluocin-Procin [Fluocinolone Acetonide] Other (See Comments)    Blisters and sores   Ezetimibe Other (See Comments)   Fluocinolone Acetonide Other (See Comments)    Reaction not recalled   Imiquimod Other (See Comments)    Other Reaction(s): Trudie Buckler Syndrome   Quinolones Other (See Comments)    Pt had to go to hospital (sores and blisters)   Statins Other (See Comments)    Made the legs hurt   Imiquimod Other (See Comments)    (Aldara) Caused sores that landed him in the hospital for 3 days- was taking Cipro at the same time, however   Penicillin V Potassium Rash   Penicillins Itching, Rash and Other (See Comments)    Has patient had a PCN reaction causing immediate rash, facial/tongue/throat swelling, SOB or lightheadedness with hypotension: Yes Has patient had a PCN reaction causing severe rash involving mucus membranes or skin necrosis: No Has patient had a PCN reaction that required hospitalization: No Has patient had a PCN reaction occurring within the last 10 years: No If all of the above answers are "NO", then may proceed with Cephalosporin use.     Blood pressure 120/62, pulse 71, SpO2 98%.   Assessment/Plan:  1. Hypertension -  Primary hypertension Assessment: BP is controlled in office BP 120/62 mmHg heart rate 71 (goal<130/80) Home BP ~140-150/75-80; home BP monitor never  validated  Tolerates current BP meds well without any side effects Denies palpitation, chest pain, headaches,or swelling SOB when active, has COPD  Review correct steps for how to check BP at home   Follows low salt diet and walks 1 mile outside when weather permits    Plan:  No medications changes  Continue taking valsartan 320 mg daily at 11 pm , hydralazine 50 mg three times daily hold if  SBP <120 (8 am, 3 pm and 11 pm)  Patient to keep record of BP readings with heart rate and report to [place] at the next visit Patient to see PharmD in 8 weeks for follow up  Follow up lab(s) : none       Thank you  Carmela Hurt, Pharm.D [facility] A Division of [facility] [address] [place], [place] [address]  Phone: [phone]; Fax: [phone]

## 2023-10-12 NOTE — Patient Instructions (Signed)
 No Changes to blood pressure medications made by your pharmacist Carmela Hurt, PharmD at today's visit:    Bring all of your meds, your BP cuff and your record of home blood pressures to your next appointment.    HOW TO TAKE YOUR BLOOD PRESSURE AT HOME  Rest 5 minutes before taking your blood pressure.  Don't smoke or drink caffeinated beverages for at least 30 minutes before. Take your blood pressure before (not after) you eat. Sit comfortably with your back supported and both feet on the floor (don't cross your legs). Elevate your arm to heart level on a table or a desk. Use the proper sized cuff. It should fit smoothly and snugly around your bare upper arm. There should be enough room to slip a fingertip under the cuff. The bottom edge of the cuff should be 1 inch above the crease of the elbow. Ideally, take 3 measurements at one sitting and record the average.  Important lifestyle changes to control high blood pressure  Intervention  Effect on the BP  Lose extra pounds and watch your waistline Weight loss is one of the most effective lifestyle changes for controlling blood pressure. If you're overweight or obese, losing even a small amount of weight can help reduce blood pressure. Blood pressure might go down by about 1 millimeter of mercury (mm Hg) with each kilogram (about 2.2 pounds) of weight lost.  Exercise regularly As a general goal, aim for at least 30 minutes of moderate physical activity every day. Regular physical activity can lower high blood pressure by about 5 to 8 mm Hg.  Eat a healthy diet Eating a diet rich in whole grains, fruits, vegetables, and low-fat dairy products and low in saturated fat and cholesterol. A healthy diet can lower high blood pressure by up to 11 mm Hg.  Reduce salt (sodium) in your diet Even a small reduction of sodium in the diet can improve heart health and reduce high blood pressure by about 5 to 6 mm Hg.  Limit alcohol One drink equals 12  ounces of beer, 5 ounces of wine, or 1.5 ounces of 80-proof liquor.  Limiting alcohol to less than one drink a day for women or two drinks a day for men can help lower blood pressure by about 4 mm Hg.   If you have any questions or concerns please use My Chart to send questions or call the office at [phone]

## 2023-10-21 ENCOUNTER — Ambulatory Visit: Attending: Cardiology | Admitting: Cardiology

## 2023-10-21 ENCOUNTER — Telehealth: Payer: Self-pay | Admitting: *Deleted

## 2023-10-21 ENCOUNTER — Encounter: Payer: Self-pay | Admitting: Cardiology

## 2023-10-21 VITALS — BP 118/80 | HR 83 | Ht 72.0 in | Wt 175.0 lb

## 2023-10-21 DIAGNOSIS — I10 Essential (primary) hypertension: Secondary | ICD-10-CM

## 2023-10-21 DIAGNOSIS — G4733 Obstructive sleep apnea (adult) (pediatric): Secondary | ICD-10-CM

## 2023-10-21 NOTE — Telephone Encounter (Signed)
 Upon patient request DME selection is [facility] Patient understands he will be contacted by [facility] to set up his cpap. Patient understands to call if [facility] does not contact him with new setup in a timely manner. Patient understands they will be called once confirmation has been received from [facility] that they have received their new machine to schedule 10 week follow up appointment.   [facility] notified of new cpap order  Please add to airview Patient was grateful for the call and thanked me.

## 2023-10-21 NOTE — Telephone Encounter (Signed)
-----   Message from Gaylyn Keas sent at 10/21/2023  9:05 AM EDT ----- order the auto CPAP from 4 to 20cm H2O with heated humidity and mask of choice

## 2023-10-21 NOTE — Progress Notes (Signed)
 Sleep Medicine CONSULT Note    Date:  10/21/2023   ID:  Joseph [name], DOB 10-09-1934, MRN [id number]  PCP:  Merri Brunette, MD  Cardiologist: None   Chief Complaint  Patient presents with   New Patient (Initial Visit)    Obstructive sleep apnea    History of Present Illness:  Joseph [name] is a 89 y.o. male who is being seen today for the evaluation of OSA at the request of Sunit Tolia, DO.  This is an 89yo male with a hx of COPD, HLD and HTN.  Due to issues with poorly controlled blood pressure Dr. Odis Hollingshead ordered a home sleep study to rule out OSA.  Home sleep study demonstrated severe obstructive sleep apnea with an AHI 32.5/h but no nocturnal hypoxemia.  CPAP titration was recommended but patient wanted to discuss results with sleep medicine physician.  Patient is now referred for sleep medicine consultation for evaluation of obstructive sleep apnea.  Past Medical History:  Diagnosis Date   Arthritis    COPD (chronic obstructive pulmonary disease) (HCC)    Dyspnea    on exertion   Hyperlipidemia    Hypertension    Macular pucker, right eye 01/11/2020   The nature of macular pucker (epiretinal membrane ERM) was discussed with the patient as well as threshold criteria for vitrectomy surgery. I explained that in rare cases another surgery is needed to actually remove a second wrinkle should it regrow.  Most often, the epiretinal membrane and underlying wrinkled internal limiting membrane are removed with the first surgery, to accomplish the goals.    OSA (obstructive sleep apnea)    severe OSA with AHI of 32.5/hr and CSA of 6.4/hr.   Pneumothorax, spontaneous, tension 1960   SOB (shortness of breath) on exertion     Past Surgical History:  Procedure Laterality Date   EYE SURGERY     bilateral cataract with lens implants   LEFT HEART CATH AND CORONARY ANGIOGRAPHY N/A 11/28/2019   Procedure: LEFT HEART CATH AND CORONARY ANGIOGRAPHY;  Surgeon: Elder Negus, MD;   Location: [facility];  Service: Cardiovascular;  Laterality: N/A;   LUNG SURGERY     40 years ago   TOE SURGERY     TOTAL KNEE ARTHROPLASTY Left 10/06/2017   Procedure: LEFT TOTAL KNEE ARTHROPLASTY;  Surgeon: Jene Every, MD;  Location: [facility];  Service: Orthopedics;  Laterality: Left;  Adductor Block    Current Medications: Current Meds  Medication Sig   albuterol (VENTOLIN HFA) 108 (90 Base) MCG/ACT inhaler Inhale 2 puffs into the lungs every 6 (six) hours as needed for wheezing or shortness of breath.   alendronate (FOSAMAX) 70 MG tablet Take 70 mg by mouth once a week.   aspirin 81 MG chewable tablet Chew 81 mg by mouth daily.   dutasteride (AVODART) 0.5 MG capsule Take 0.5 mg by mouth daily.   hydrALAZINE (APRESOLINE) 50 MG tablet Take 1 tablet (50 mg total) by mouth 3 (three) times daily. Hold if SBP less than   levothyroxine (SYNTHROID) 137 MCG tablet Take 137 mcg by mouth daily before breakfast. Was told on 11/06/19 by PCP not to take   lovastatin (MEVACOR) 40 MG tablet Take 40 mg by mouth daily.   magnesium oxide (MAG-OX) 400 MG tablet Take 400 mg by mouth daily.   meloxicam (MOBIC) 7.5 MG tablet Take 7.5 mg by mouth daily as needed for pain.    omeprazole (PRILOSEC) 40 MG capsule Take  20 mg by mouth daily before breakfast.   valsartan (DIOVAN) 320 MG tablet TAKE 1 TABLET BY MOUTH EVERY DAY    Allergies:   Ciprofloxacin, Ciprocin-fluocin-procin [fluocinolone acetonide], Ezetimibe, Fluocinolone acetonide, Imiquimod, Quinolones, Statins, Imiquimod, Penicillin v potassium, and Penicillins   Social History   Socioeconomic History   Marital status: Divorced    Spouse name: Not on file   Number of children: 0   Years of education: Not on file   Highest education level: Not on file  Occupational History   Occupation: attorney  Tobacco Use   Smoking status: Former    Current packs/day: 0.00    Average packs/day: 1 pack/day for 5.0 years (5.0 ttl pk-yrs)     Types: Cigarettes    Start date: 11/23/1953    Quit date: 11/24/1958    Years since quitting: 64.9   Smokeless tobacco: Never  Vaping Use   Vaping status: Never Used  Substance and Sexual Activity   Alcohol use: No   Drug use: No   Sexual activity: Not on file  Other Topics Concern   Not on file  Social History Narrative   Not on file   Social Drivers of Health   Financial Resource Strain: Not on file  Food Insecurity: Not on file  Transportation Needs: Not on file  Physical Activity: Not on file  Stress: Not on file  Social Connections: Not on file     Family History:  The patient's family history is not on file.   ROS:   Please see the history of present illness.    ROS All other systems reviewed and are negative.      No data to display             PHYSICAL EXAM:   VS:  BP 118/80   Pulse 83   Ht 6' (1.829 m)   Wt 175 lb (79.4 kg)   SpO2 97%   BMI 23.73 kg/m    GEN: Well nourished, well developed, in no acute distress  HEENT: normal  Neck: no JVD, carotid bruits, or masses Cardiac: RRR; no murmurs, rubs, or gallops,no edema.  Intact distal pulses bilaterally.  Respiratory:  clear to auscultation bilaterally, normal work of breathing GI: soft, nontender, nondistended, + BS MS: no deformity or atrophy  Skin: warm and dry, no rash Neuro:  Alert and Oriented x 3, Strength and sensation are intact Psych: euthymic mood, full affect  Wt Readings from Last 3 Encounters:  10/21/23 175 lb (79.4 kg)  09/17/23 175 lb (79.4 kg)  06/25/23 175 lb (79.4 kg)      Studies/Labs Reviewed:   Home sleep study  Recent Labs: 06/25/2023: Hemoglobin 15.8; Platelets 192 07/06/2023: BUN 21; Creatinine, Ser 1.16; Potassium 4.3; Sodium 141     ASSESSMENT:    1. OSA (obstructive sleep apnea)   2. Essential hypertension, benign      PLAN:  In order of problems listed above:  OSA  -severe obstructive sleep apnea with an AHI 32.5/h but no nocturnal  hypoxemia -Findings of sleep study were reviewed with the patient including the severity of his sleep disordered breathing -We discussed the potential risks of untreated sleep apnea including the increased risk on the cardiovascular system including increased risk of stroke, CAD, MI, pulmonary hypertension, CHF as well as increased risk of diabetes. -I have recommended proceeding with a CPAP titration for further treatment of obstructive sleep apnea -he is not convinced that he has sleep apnea despite the results of the  study -he wants to go ahead and order the auto CPAP from 4 to 20cm H2O with heated humidity and mask of choice and will try it but not sure that he will use it  HTN -BP controlled on exam today -continue prescription drug management with Hydralazine 50mg  TID and Valsartan 320mg  daily with PRN refills  Followup with me in 6 weeks after getting device  Time Spent: 20 minutes total time of encounter, including 15 minutes spent in face-to-face patient care on the date of this encounter. This time includes coordination of care and counseling regarding above mentioned problem list. Remainder of non-face-to-face time involved reviewing chart documents/testing relevant to the patient encounter and documentation in the medical record. I have independently reviewed documentation from referring provider  Medication Adjustments/Labs and Tests Ordered: Current medicines are reviewed at length with the patient today.  Concerns regarding medicines are outlined above.  Medication changes, Labs and Tests ordered today are listed in the Patient Instructions below.  There are no Patient Instructions on file for this visit.   Signed, Gaylyn Keas, MD  10/21/2023 8:42 AM    [facility] [address] [place], [place], [place]  [address] Phone: [phone]; Fax: [phone]

## 2023-10-21 NOTE — Patient Instructions (Signed)
 Medication Instructions:  Your physician recommends that you continue on your current medications as directed. Please refer to the Current Medication list given to you today.  *If you need a refill on your cardiac medications before your next appointment, please call your pharmacy*   Follow-Up: At [facility], you and your health needs are our priority.  As part of our continuing mission to provide you with exceptional heart care, our providers are all part of one team.  This team includes your primary Cardiologist (physician) and Advanced Practice Providers or APPs (Physician Assistants and Nurse Practitioners) who all work together to provide you with the care you need, when you need it.  Your next appointment:   Our sleep team will be in contact with you about getting your CPAP machine. You will follow up with Dr. Micael Adas 6 weeks after your receive your device.       1st Floor: - Lobby - Registration  - Pharmacy  - Lab - Cafe  2nd Floor: - PV Lab - Diagnostic Testing (echo, CT, nuclear med)  3rd Floor: - Vacant  4th Floor: - TCTS (cardiothoracic surgery) - AFib Clinic - Structural Heart Clinic - Vascular Surgery  - Vascular Ultrasound  5th Floor: - [facility] Cardiology (general and EP) - Clinical Pharmacy for coumadin, hypertension, lipid, weight-loss medications, and med management appointments    Valet parking services will be available as well.

## 2023-11-10 ENCOUNTER — Telehealth: Payer: Self-pay | Admitting: Cardiology

## 2023-11-10 DIAGNOSIS — G4733 Obstructive sleep apnea (adult) (pediatric): Secondary | ICD-10-CM | POA: Diagnosis not present

## 2023-11-10 NOTE — Telephone Encounter (Signed)
 New Message:     Patient says he needs the name and address where he needs to go and get his C Pap machine please?

## 2023-11-24 NOTE — Telephone Encounter (Signed)
 Returned patient call [facility] to let him know the dme will call him once they have the approval from his insurance.

## 2023-12-07 DIAGNOSIS — H3562 Retinal hemorrhage, left eye: Secondary | ICD-10-CM | POA: Diagnosis not present

## 2023-12-07 DIAGNOSIS — H43812 Vitreous degeneration, left eye: Secondary | ICD-10-CM | POA: Diagnosis not present

## 2023-12-07 DIAGNOSIS — X32XXXD Exposure to sunlight, subsequent encounter: Secondary | ICD-10-CM | POA: Diagnosis not present

## 2023-12-07 DIAGNOSIS — H353132 Nonexudative age-related macular degeneration, bilateral, intermediate dry stage: Secondary | ICD-10-CM | POA: Diagnosis not present

## 2023-12-07 DIAGNOSIS — D225 Melanocytic nevi of trunk: Secondary | ICD-10-CM | POA: Diagnosis not present

## 2023-12-07 DIAGNOSIS — H33321 Round hole, right eye: Secondary | ICD-10-CM | POA: Diagnosis not present

## 2023-12-07 DIAGNOSIS — L570 Actinic keratosis: Secondary | ICD-10-CM | POA: Diagnosis not present

## 2023-12-11 DIAGNOSIS — G4733 Obstructive sleep apnea (adult) (pediatric): Secondary | ICD-10-CM | POA: Diagnosis not present

## 2023-12-13 ENCOUNTER — Encounter: Payer: Self-pay | Admitting: Pharmacist

## 2023-12-13 ENCOUNTER — Ambulatory Visit: Attending: Internal Medicine | Admitting: Pharmacist

## 2023-12-13 VITALS — BP 106/65 | HR 89

## 2023-12-13 DIAGNOSIS — I10 Essential (primary) hypertension: Secondary | ICD-10-CM | POA: Diagnosis not present

## 2023-12-13 NOTE — Progress Notes (Signed)
 Patient ID: Joseph [name]                 DOB: [birth year]                      MRN: [id number]      HPI: Joseph [name] is a 89 y.o. male referred by Dr. Albert Huff to HTN clinic. PMH is significant for hypertension, orthostatic hypotension, OSA, COPD, HLD.  Patient BP medications adjusted various time due low and high BP home readings. Patient was referred to hypertension clinic.  At his last visit on 10/12/2023, the patient reported tolerating his current antihypertensive regimen without any issues. He monitors his blood pressure at home immediately upon waking. His hydralazine  50 mg is taken three times daily--around 8:00 AM, 3:00 PM, and 11:00 PM. He also takes valsartan  320 mg at 11:00 PM.  The patient was previously on amlodipine , but it was discontinued. He is unsure of the reason for its discontinuation. He adheres to a low-salt diet.  His home blood pressure monitor is 47-83 years old and has not been validated. He does not experience any symptoms when his blood pressure is elevated. He denies palpitations, chest pain, swelling, or headaches. He reports shortness of breath only with exertion, attributed to his underlying COPD, which limits his exercise capacity.  On a good day, he is able to walk approximately 1 mile outdoors, which typically takes 20-25 minutes.  In-office blood pressure readings were at goal, and therefore, no changes were made to his current medication regimen.  Walk about a mile after taking this morning pills, in last 2 weeks skip taking 3 pm dose due to SBP<120  Today patient presented for follow up. He forgot to bring BP log and monitor for validation. Reports his BP stays at goal at home most os the time occasionally he notices BP 150/80 range. He takes 3 pm hydralazine  if his SBP > 120. In last 2 weeks he has to skip the mid day dose due to low SBP.  Still eats out and go for regular walks    Current HTN meds: valsartan  320 mg daily at 11 pm , hydralazine  50 mg  three times daily hold if SBP <120 (8 am, 3 pm and 11 pm)  Previously tried: amlodipine  - does not recall reason for discontinuation, denies swelling from it  BP goal: <140/90 given advance age  CrCl : 50 mL/min    Social History:  Alcohol : none  Smoking : none  Diet: eating home cooked low salt meals, selecting low or no salt added food option.   Exercise:  Walk outside when weather permits - 1 mile (about 20-25 min) SOB limits exercise capacity   Home BP readings: ~ 120-130/60-80   Wt Readings from Last 3 Encounters:  10/21/23 175 lb (79.4 kg)  09/17/23 175 lb (79.4 kg)  06/25/23 175 lb (79.4 kg)   BP Readings from Last 3 Encounters:  12/13/23 106/65  10/21/23 118/80  10/12/23 120/62   Pulse Readings from Last 3 Encounters:  12/13/23 89  10/21/23 83  10/12/23 71    Renal function: CrCl cannot be calculated (Patient's most recent lab result is older than the maximum 21 days allowed.).  Past Medical History:  Diagnosis Date   Arthritis    COPD (chronic obstructive pulmonary disease) (HCC)    Dyspnea    on exertion   Hyperlipidemia    Hypertension    Macular pucker, right eye 01/11/2020   The  nature of macular pucker (epiretinal membrane ERM) was discussed with the patient as well as threshold criteria for vitrectomy surgery. I explained that in rare cases another surgery is needed to actually remove a second wrinkle should it regrow.  Most often, the epiretinal membrane and underlying wrinkled internal limiting membrane are removed with the first surgery, to accomplish the goals.    OSA (obstructive sleep apnea)    severe OSA with AHI of 32.5/hr and CSA of 6.4/hr.   Pneumothorax, spontaneous, tension 1960   SOB (shortness of breath) on exertion     Current Outpatient Medications on File Prior to Visit  Medication Sig Dispense Refill   albuterol  (VENTOLIN  HFA) 108 (90 Base) MCG/ACT inhaler Inhale 2 puffs into the lungs every 6 (six) hours as needed for wheezing or  shortness of breath.     alendronate (FOSAMAX) 70 MG tablet Take 70 mg by mouth once a week.     aspirin  81 MG chewable tablet Chew 81 mg by mouth daily.     dutasteride  (AVODART ) 0.5 MG capsule Take 0.5 mg by mouth daily.     hydrALAZINE  (APRESOLINE ) 50 MG tablet Take 1 tablet (50 mg total) by mouth 3 (three) times daily. Hold if SBP less than     levothyroxine  (SYNTHROID ) 137 MCG tablet Take 137 mcg by mouth daily before breakfast. Was told on 11/06/19 by PCP not to take     lovastatin (MEVACOR) 40 MG tablet Take 40 mg by mouth daily.     magnesium  oxide (MAG-OX) 400 MG tablet Take 400 mg by mouth daily.     meloxicam (MOBIC) 7.5 MG tablet Take 7.5 mg by mouth daily as needed for pain.      nitroGLYCERIN  (NITROSTAT ) 0.4 MG SL tablet Place 1 tablet (0.4 mg total) under the tongue every 5 (five) minutes as needed for chest pain. 30 tablet 3   omeprazole (PRILOSEC) 40 MG capsule Take 20 mg by mouth daily before breakfast.     valsartan  (DIOVAN ) 320 MG tablet TAKE 1 TABLET BY MOUTH EVERY DAY 90 tablet 1   No current facility-administered medications on file prior to visit.    Allergies  Allergen Reactions   Ciprofloxacin Anaphylaxis and Other (See Comments)    Caused sores & blisters and a trip to the hospital- was also taking Aldara at the same time, however  Other Reaction(s): Rogue Clear Syndrome   Ciprocin-Fluocin-Procin [Fluocinolone Acetonide] Other (See Comments)    Blisters and sores   Ezetimibe Other (See Comments)   Fluocinolone Acetonide Other (See Comments)    Reaction not recalled   Imiquimod Other (See Comments)    Other Reaction(s): Rogue Clear Syndrome   Quinolones Other (See Comments)    Pt had to go to hospital (sores and blisters)   Statins Other (See Comments)    Made the legs hurt   Imiquimod Other (See Comments)    (Aldara) Caused sores that landed him in the hospital for 3 days- was taking Cipro at the same time, however   Penicillin V Potassium  Rash   Penicillins Itching, Rash and Other (See Comments)    Has patient had a PCN reaction causing immediate rash, facial/tongue/throat swelling, SOB or lightheadedness with hypotension: Yes Has patient had a PCN reaction causing severe rash involving mucus membranes or skin necrosis: No Has patient had a PCN reaction that required hospitalization: No Has patient had a PCN reaction occurring within the last 10 years: No If all of the above answers are "  NO", then may proceed with Cephalosporin use.     Blood pressure 106/65, pulse 89, SpO2 97%.   Assessment/Plan:  1. Hypertension -  Primary hypertension Assessment: BP is controlled in office BP 106/65 mmHg heart rate 71 (goal<130/80) Home BP ~ 120-130/60-80 home BP monitor never validated  Tolerates current BP meds well without any side effects Denies palpitation, chest pain, headaches,or swelling SOB when active, has COPD  Review correct steps for how to check BP at home   Follows low salt diet and walks 1 mile outside when weather permits    Plan:  No medications changes given at goal BP Continue taking valsartan  320 mg daily at 11 pm , hydralazine  50 mg three times daily hold if SBP <120 (8 am, 3 pm and 11 pm) ( in last 2 weeks twice skip taking mid day hydralazine  dose due SBP<120)  Patient to keep record of BP readings with heart rate and report to us  at the next visit Patient to see PharmD in 6-7 weeks for follow up  Follow up lab(s) : none    Thank you  Nickola Baron, Pharm.D  [facility] [address] [place], [place] [address]  Phone: [phone]; Fax: [phone]

## 2023-12-13 NOTE — Assessment & Plan Note (Signed)
 Assessment: BP is controlled in office BP 106/65 mmHg heart rate 71 (goal<130/80) Home BP ~ 120-130/60-80 home BP monitor never validated  Tolerates current BP meds well without any side effects Denies palpitation, chest pain, headaches,or swelling SOB when active, has COPD  Review correct steps for how to check BP at home   Follows low salt diet and walks 1 mile outside when weather permits    Plan:  No medications changes given at goal BP Continue taking valsartan  320 mg daily at 11 pm , hydralazine  50 mg three times daily hold if SBP <120 (8 am, 3 pm and 11 pm) ( in last 2 weeks twice skip taking mid day hydralazine  dose due SBP<120)  Patient to keep record of BP readings with heart rate and report to us  at the next visit Patient to see PharmD in 6-7 weeks for follow up  Follow up lab(s) : none

## 2023-12-13 NOTE — Patient Instructions (Addendum)
 No Changes made by your pharmacist Nickola Baron, PharmD at today's visit:  Increase water  intake from 2 glasses per day to 4-5 glasses per day  Bring all of your meds, your BP cuff and your record of home blood pressures to your next appointment.    HOW TO TAKE YOUR BLOOD PRESSURE AT HOME  Rest 5 minutes before taking your blood pressure.  Don't smoke or drink caffeinated beverages for at least 30 minutes before. Take your blood pressure before (not after) you eat. Sit comfortably with your back supported and both feet on the floor (don't cross your legs). Elevate your arm to heart level on a table or a desk. Use the proper sized cuff. It should fit smoothly and snugly around your bare upper arm. There should be enough room to slip a fingertip under the cuff. The bottom edge of the cuff should be 1 inch above the crease of the elbow. Ideally, take 3 measurements at one sitting and record the average.  Important lifestyle changes to control high blood pressure  Intervention  Effect on the BP  Lose extra pounds and watch your waistline Weight loss is one of the most effective lifestyle changes for controlling blood pressure. If you're overweight or obese, losing even a small amount of weight can help reduce blood pressure. Blood pressure might go down by about 1 millimeter of mercury (mm Hg) with each kilogram (about 2.2 pounds) of weight lost.  Exercise regularly As a general goal, aim for at least 30 minutes of moderate physical activity every day. Regular physical activity can lower high blood pressure by about 5 to 8 mm Hg.  Eat a healthy diet Eating a diet rich in whole grains, fruits, vegetables, and low-fat dairy products and low in saturated fat and cholesterol. A healthy diet can lower high blood pressure by up to 11 mm Hg.  Reduce salt (sodium) in your diet Even a small reduction of sodium in the diet can improve heart health and reduce high blood pressure by about 5 to 6 mm Hg.   Limit alcohol  One drink equals 12 ounces of beer, 5 ounces of wine, or 1.5 ounces of 80-proof liquor.  Limiting alcohol  to less than one drink a day for women or two drinks a day for men can help lower blood pressure by about 4 mm Hg.   If you have any questions or concerns please use My Chart to send questions or call the office at [phone]

## 2023-12-23 ENCOUNTER — Ambulatory Visit: Admitting: Cardiology

## 2023-12-23 NOTE — Progress Notes (Unsigned)
 SLEEP MEDICINE VIRTUAL VISIT      Virtual Visit via Video Note   Because of Joseph [name]'s co-morbid illnesses, he is at least at moderate risk for complications without adequate follow up.  This format is felt to be most appropriate for this patient at this time.  All issues noted in this document were discussed and addressed.  A limited physical exam was performed with this format.  Please refer to the patient's chart for his consent to telehealth for [facility].       Date:  12/23/2023   ID:  Alda Hummer, DOB August 23, 1934, MRN [id number] The patient was identified using 2 identifiers.  Patient Location: Home Provider Location: Home Office   PCP:  Imelda Man, MD   [facility] Providers Cardiologist:  None Sleep Medicine:  Gaylyn Keas, MD     Evaluation Performed:  Follow-Up Visit  Chief Complaint:  OSA  History of Present Illness:    Joseph [name] is a 89 y.o. male with  a hx of COPD, HLD and HTN.  Due to issues with poorly controlled blood pressure Dr. Albert Huff ordered a home sleep study to rule out OSA.  Home sleep study demonstrated severe obstructive sleep apnea with an AHI 32.5/h but no nocturnal hypoxemia. At last OV we discussed treatment options for OSA and he was started on auto CPAP from 4 to 20cm H2O    He is doing well with his PAP device and thinks that he has gotten used to it.  He tolerates the mask and feels the pressure is adequate.  Since going on PAP he feels rested in the am and has no significant daytime sleepiness.  He denies any significant mouth or nasal dryness or nasal congestion.  He does not think that he snores.      Past Medical History:  Diagnosis Date   Arthritis    COPD (chronic obstructive pulmonary disease) (HCC)    Dyspnea    on exertion   Hyperlipidemia    Hypertension    Macular pucker, right eye 01/11/2020   The nature of macular pucker (epiretinal membrane ERM) was discussed with the patient as well as  threshold criteria for vitrectomy surgery. I explained that in rare cases another surgery is needed to actually remove a second wrinkle should it regrow.  Most often, the epiretinal membrane and underlying wrinkled internal limiting membrane are removed with the first surgery, to accomplish the goals.    OSA (obstructive sleep apnea)    severe OSA with AHI of 32.5/hr and CSA of 6.4/hr.   Pneumothorax, spontaneous, tension 1960   SOB (shortness of breath) on exertion    Past Surgical History:  Procedure Laterality Date   EYE SURGERY     bilateral cataract with lens implants   LEFT HEART CATH AND CORONARY ANGIOGRAPHY N/A 11/28/2019   Procedure: LEFT HEART CATH AND CORONARY ANGIOGRAPHY;  Surgeon: Cody Das, MD;  [facility]: [facility];  Service: Cardiovascular;  Laterality: N/A;   LUNG SURGERY     40 years ago   TOE SURGERY     TOTAL KNEE ARTHROPLASTY Left 10/06/2017   Procedure: LEFT TOTAL KNEE ARTHROPLASTY;  Surgeon: Orvan Blanch, MD;  Location: [facility];  Service: Orthopedics;  Laterality: Left;  Adductor Block     Current Meds  Medication Sig   albuterol  (VENTOLIN  HFA) 108 (90 Base) MCG/ACT inhaler Inhale 2 puffs into the lungs every 6 (six) hours as needed for wheezing or shortness of  breath.   alendronate (FOSAMAX) 70 MG tablet Take 70 mg by mouth once a week.   aspirin  81 MG chewable tablet Chew 81 mg by mouth daily.   dutasteride  (AVODART ) 0.5 MG capsule Take 0.5 mg by mouth daily.   hydrALAZINE  (APRESOLINE ) 50 MG tablet Take 1 tablet (50 mg total) by mouth 3 (three) times daily. Hold if SBP less than   levothyroxine  (SYNTHROID ) 137 MCG tablet Take 137 mcg by mouth daily before breakfast. Was told on 11/06/19 by PCP not to take   lovastatin (MEVACOR) 40 MG tablet Take 40 mg by mouth daily.   magnesium  oxide (MAG-OX) 400 MG tablet Take 400 mg by mouth daily.   meloxicam (MOBIC) 7.5 MG tablet Take 7.5 mg by mouth daily as needed for pain.    nitroGLYCERIN   (NITROSTAT ) 0.4 MG SL tablet Place 1 tablet (0.4 mg total) under the tongue every 5 (five) minutes as needed for chest pain.   omeprazole (PRILOSEC) 40 MG capsule Take 20 mg by mouth daily before breakfast.   valsartan  (DIOVAN ) 320 MG tablet TAKE 1 TABLET BY MOUTH EVERY DAY     Allergies:   Ciprofloxacin, Ciprocin-fluocin-procin [fluocinolone acetonide], Ezetimibe, Fluocinolone acetonide, Imiquimod, Quinolones, Statins, Imiquimod, Penicillin v potassium, and Penicillins   Social History   Tobacco Use   Smoking status: Former    Current packs/day: 0.00    Average packs/day: 1 pack/day for 5.0 years (5.0 ttl pk-yrs)    Types: Cigarettes    Start date: 11/23/1953    Quit date: 11/24/1958    Years since quitting: 65.1   Smokeless tobacco: Never  Vaping Use   Vaping status: Never Used  Substance Use Topics   Alcohol  use: No   Drug use: No     Family Hx: The patient's family history is not on file.  ROS:   Please see the history of present illness.     All other systems reviewed and are negative.   Prior Sleep studies:   The following studies were reviewed today:  Home sleep study, PAP compliance download  Labs/Other Tests and Data Reviewed:     Recent Labs: 06/25/2023: Hemoglobin 15.8; Platelets 192 07/06/2023: BUN 21; Creatinine, Ser 1.16; Potassium 4.3; Sodium 141   Wt Readings from Last 3 Encounters:  10/21/23 175 lb (79.4 kg)  09/17/23 175 lb (79.4 kg)  06/25/23 175 lb (79.4 kg)     Risk Assessment/Calculations:      Objective:    Vital Signs:  There were no vitals taken for this visit.   VITAL SIGNS:  reviewed GEN:  no acute distress EYES:  sclerae anicteric, EOMI - Extraocular Movements Intact RESPIRATORY:  normal respiratory effort, symmetric expansion CARDIOVASCULAR:  no peripheral edema SKIN:  no rash, lesions or ulcers. MUSCULOSKELETAL:  no obvious deformities. NEURO:  alert and oriented x 3, no obvious focal deficit PSYCH:  normal affect I can  try  ASSESSMENT & PLAN:    OSA - The patient is tolerating PAP therapy well without any problems. The PAP download performed by his DME was personally reviewed and interpreted by me today and showed an AHI of 20.7 /hr on auto CPAP from 4 to 20 cm H2O with 100 % compliance in using more than 4 hours nightly.  The patient has been using and benefiting from PAP use and will continue to benefit from therapy.  -Unfortunately AHI is still too high and therefore I do not think CPAP is going to be able to adequately treat his severe  OSA -I recommend sending him to the sleep lab for a BiPAP titration.  HTN -BP controlled -continue hydralazine  50 mg 3 times daily, valsartan  320 mg daily with as needed refills    Total time of encounter: 20 minutes total time of encounter, including 15 minutes spent in face-to-face patient care on the date of this encounter. This time includes coordination of care and counseling regarding above mentioned problem list. Remainder of non-face-to-face time involved reviewing chart documents/testing relevant to the patient encounter and documentation in the medical record. I have independently reviewed documentation from referring provider.    Medication Adjustments/Labs and Tests Ordered: Current medicines are reviewed at length with the patient today.  Concerns regarding medicines are outlined above.   Tests Ordered: No orders of the defined types were placed in this encounter.   Medication Changes: No orders of the defined types were placed in this encounter.   Follow Up:  {F/U Format:[phone]} {follow up:15908}  Signed, Gaylyn Keas, MD  12/23/2023 10:22 AM    [facility]

## 2023-12-29 ENCOUNTER — Ambulatory Visit: Admitting: Cardiology

## 2023-12-29 VITALS — BP 129/77 | Ht 72.0 in | Wt 170.0 lb

## 2023-12-29 NOTE — Progress Notes (Unsigned)
 This encounter was created in error - please disregard.

## 2024-01-10 DIAGNOSIS — G4733 Obstructive sleep apnea (adult) (pediatric): Secondary | ICD-10-CM | POA: Diagnosis not present

## 2024-01-13 ENCOUNTER — Ambulatory Visit: Attending: Cardiology | Admitting: Cardiology

## 2024-01-13 ENCOUNTER — Encounter: Payer: Self-pay | Admitting: Cardiology

## 2024-01-13 VITALS — BP 162/68 | HR 72 | Ht 72.0 in | Wt 175.6 lb

## 2024-01-13 DIAGNOSIS — G4733 Obstructive sleep apnea (adult) (pediatric): Secondary | ICD-10-CM

## 2024-01-13 DIAGNOSIS — I10 Essential (primary) hypertension: Secondary | ICD-10-CM

## 2024-01-13 NOTE — Addendum Note (Signed)
 Addended by: JANIT GENI CROME on: 01/13/2024 11:54 AM   Modules accepted: Orders

## 2024-01-13 NOTE — Progress Notes (Signed)
 Sleep Medicine Note    Date:  01/13/2024   ID:  TRUTH BAROT, DOB 1934/09/21, MRN [id number]  PCP:  Clarice Ryan, MD  Cardiologist: Madonna Large, DO  Chief Complaint  Patient presents with   Sleep Apnea   Hypertension    History of Present Illness:  Joseph [name] is a 89 y.o. male with a hx of COPD, HLD and HTN.  Due to issues with poorly controlled blood pressure Dr. Large ordered a home sleep study to rule out OSA.  Home sleep study demonstrated severe obstructive sleep apnea with an AHI 32.5/h but no nocturnal hypoxemia.  CPAP titration was recommended but patient wanted to discuss results with sleep medicine physician.  At last office visit it was decided he would start CPAP therapy.  He was started on auto CPAP from 4 to 20 cm H2O.  He is now back for follow-up after starting CPAP therapy.  He is doing well with his PAP device and thinks that he has gotten used to it.  He tolerates the full face mask for the most part but some times will take it off during the night and forget to put it back.  He does not think that it leaks.  He feels the pressure is adequate.  He has not really noticed any difference in how sleepy he feels during the day but does feel that he has less SOB in the am.  He denies any significant mouth or nasal dryness or nasal congestion.  He does not think that he snores.     Past Medical History:  Diagnosis Date   Arthritis    COPD (chronic obstructive pulmonary disease) (HCC)    Dyspnea    on exertion   Hyperlipidemia    Hypertension    Macular pucker, right eye 01/11/2020   The nature of macular pucker (epiretinal membrane ERM) was discussed with the patient as well as threshold criteria for vitrectomy surgery. I explained that in rare cases another surgery is needed to actually remove a second wrinkle should it regrow.  Most often, the epiretinal membrane and underlying wrinkled internal limiting membrane are removed with the first surgery, to  accomplish the goals.    OSA (obstructive sleep apnea)    severe OSA with AHI of 32.5/hr and CSA of 6.4/hr.   Pneumothorax, spontaneous, tension 1960   SOB (shortness of breath) on exertion     Past Surgical History:  Procedure Laterality Date   EYE SURGERY     bilateral cataract with lens implants   LEFT HEART CATH AND CORONARY ANGIOGRAPHY N/A 11/28/2019   Procedure: LEFT HEART CATH AND CORONARY ANGIOGRAPHY;  Surgeon: Elmira Newman PARAS, MD;  Location: [facility];  Service: Cardiovascular;  Laterality: N/A;   LUNG SURGERY     40 years ago   TOE SURGERY     TOTAL KNEE ARTHROPLASTY Left 10/06/2017   Procedure: LEFT TOTAL KNEE ARTHROPLASTY;  Surgeon: Duwayne Purchase, MD;  Location: [facility];  Service: Orthopedics;  Laterality: Left;  Adductor Block    Current Medications: No outpatient medications have been marked as taking for the 01/13/24 encounter (Office Visit) with Shlomo Wilbert SAUNDERS, MD.    Allergies:   Ciprofloxacin, Ciprocin-fluocin-procin [fluocinolone acetonide], Ezetimibe, Fluocinolone acetonide, Imiquimod, Quinolones, Statins, Imiquimod, Penicillin v potassium, and Penicillins   Social History   Socioeconomic History   Marital status: Divorced    Spouse name: Not on file   Number of children: 0   Years of  education: Not on file   Highest education level: Not on file  Occupational History   Occupation: attorney  Tobacco Use   Smoking status: Former    Current packs/day: 0.00    Average packs/day: 1 pack/day for 5.0 years (5.0 ttl pk-yrs)    Types: Cigarettes    Start date: 11/23/1953    Quit date: 11/24/1958    Years since quitting: 65.1   Smokeless tobacco: Never  Vaping Use   Vaping status: Never Used  Substance and Sexual Activity   Alcohol  use: No   Drug use: No   Sexual activity: Not on file  Other Topics Concern   Not on file  Social History Narrative   Not on file   Social Drivers of Health   Financial Resource Strain: Not on file  Food  Insecurity: Not on file  Transportation Needs: Not on file  Physical Activity: Not on file  Stress: Not on file  Social Connections: Not on file     Family History:  The patient's family history is not on file.   ROS:   Please see the history of present illness.    ROS All other systems reviewed and are negative.      No data to display             PHYSICAL EXAM:   VS:  BP (!) 162/68   Pulse 72   Ht 6' (1.829 m)   Wt 175 lb 9.6 oz (79.7 kg)   SpO2 99%   BMI 23.82 kg/m    GEN: Well nourished, well developed in no acute distress HEENT: Normal NECK: No JVD; No carotid bruits LYMPHATICS: No lymphadenopathy CARDIAC:RRR, no murmurs, rubs, gallops RESPIRATORY:  Clear to auscultation without rales, wheezing or rhonchi  ABDOMEN: Soft, non-tender, non-distended MUSCULOSKELETAL:  No edema; No deformity  SKIN: Warm and dry NEUROLOGIC:  Alert and oriented x 3 PSYCHIATRIC:  Normal affect  Wt Readings from Last 3 Encounters:  01/13/24 175 lb 9.6 oz (79.7 kg)  12/29/23 170 lb (77.1 kg)  10/21/23 175 lb (79.4 kg)      Studies/Labs Reviewed:   Home sleep study  Recent Labs: 06/25/2023: Hemoglobin 15.8; Platelets 192 07/06/2023: BUN 21; Creatinine, Ser 1.16; Potassium 4.3; Sodium 141     ASSESSMENT:    1. OSA (obstructive sleep apnea)   2. Essential hypertension, benign       PLAN:  In order of problems listed above:  OSA - The patient is tolerating PAP therapy well without any problems. The PAP download performed by his DME was personally reviewed and interpreted by me today and showed an AHI of 22.5 /hr on auto CPAP from 4-20 cm H2O with 87% compliance in using more than 4 hours nightly.  The patient has been using and benefiting from PAP use and will continue to benefit from therapy.  - Unfortunately his AHI is too high on auto CPAP without any real significant mask leak - I am going to refer him to the sleep lab for a BiPAP titration  HTN - BP borderline  controlled on exam today>>he brought in BP readings from home which are fairly well controlled - Continue hydralazine  50 mg 3 times daily, valsartan  3 and 20 mg daily with as needed refills -I have personally reviewed and interpreted outside labs performed by patient's PCP which showed SCr 1.16 and K+ 4.3 on 07/05/2024 -he has had problems with orthostatic hypotension in the past -will get back in with Dr. Michele since it  has been almost a year  Followup with me in 1 year  Time Spent: 20 minutes total time of encounter, including 15 minutes spent in face-to-face patient care on the date of this encounter. This time includes coordination of care and counseling regarding above mentioned problem list. Remainder of non-face-to-face time involved reviewing chart documents/testing relevant to the patient encounter and documentation in the medical record. I have independently reviewed documentation from referring provider  Medication Adjustments/Labs and Tests Ordered: Current medicines are reviewed at length with the patient today.  Concerns regarding medicines are outlined above.  Medication changes, Labs and Tests ordered today are listed in the Patient Instructions below.  There are no Patient Instructions on file for this visit.   Signed, Wilbert Bihari, MD  01/13/2024 11:36 AM    [facility] [address] [place], [place], [place]  [address] Phone: [phone]; Fax: [phone]

## 2024-01-13 NOTE — Patient Instructions (Signed)
 Medication Instructions:  Your physician recommends that you continue on your current medications as directed. Please refer to the Current Medication list given to you today.  *If you need a refill on your cardiac medications before your next appointment, please call your pharmacy*  Lab Work: None.  If you have labs (blood work) drawn today and your tests are completely normal, you will receive your results only by: MyChart Message (if you have MyChart) OR A paper copy in the mail If you have any lab test that is abnormal or we need to change your treatment, we will call you to review the results.  Testing/Procedures: Your physician has recommended that you have a sleep study called a bipap titration. This test records several body functions during sleep, including: brain activity, eye movement, oxygen and carbon dioxide blood levels, heart rate and rhythm, breathing rate and rhythm, the flow of air through your mouth and nose, snoring, body muscle movements, and chest and belly movement.   Follow-Up: At [facility], you and your health needs are our priority.  As part of our continuing mission to provide you with exceptional heart care, our providers are all part of one team.  This team includes your primary Cardiologist (physician) and Advanced Practice Providers or APPs (Physician Assistants and Nurse Practitioners) who all work together to provide you with the care you need, when you need it.  Your next appointment with Dr. Wilbert Bihari, MD will be dependent on the results of your bipap titration.  You also need to make an appointment with:    Provider:    Dr. Michele

## 2024-01-21 ENCOUNTER — Other Ambulatory Visit: Payer: Self-pay | Admitting: Internal Medicine

## 2024-01-21 DIAGNOSIS — I10 Essential (primary) hypertension: Secondary | ICD-10-CM

## 2024-02-02 ENCOUNTER — Encounter: Payer: Self-pay | Admitting: Pharmacist

## 2024-02-02 ENCOUNTER — Telehealth: Payer: Self-pay

## 2024-02-02 ENCOUNTER — Ambulatory Visit: Attending: Cardiology | Admitting: Pharmacist

## 2024-02-02 VITALS — BP 128/71 | HR 76

## 2024-02-02 DIAGNOSIS — I10 Essential (primary) hypertension: Secondary | ICD-10-CM

## 2024-02-02 NOTE — Telephone Encounter (Signed)
**Note De-Identified Joseph [name] Obfuscation** Due to issues with the [facility] provider portal, I completed a PA form for a BIPAP Titration , attached all clinical notes and faxed all to [facility] at [phone]. I did receive  confirmation that the fax sent successfully.

## 2024-02-02 NOTE — Assessment & Plan Note (Signed)
 Assessment: BP is controlled in office BP 128/71 mmHg heart rate 79 (goal<130/80) Home monitor validated today found to be accurate- reads within 10 points compared to the office monitor  Tolerates current BP meds well without any side effects Denies palpitation, chest pain, headaches,or swelling SOB when active, has COPD  Follows correct steps for how to check BP at home  except he does not like the 5 min rest period - advised to take 1 st reading and discard that and take 2nd and 3rd average for recording  Follows low salt diet and walks 1 mile outside when weather permits    Plan:  No medications changes given at goal BP Continue taking valsartan  320 mg daily at 11 pm , hydralazine  50 mg three times daily hold if SBP <120 (8 am, 3 pm and 11 pm)  Patient to keep record of BP readings with heart rate and report to us  at the next visit Patient to see PharmD in 12 weeks for follow up  Follow up lab(s) : none

## 2024-02-02 NOTE — Progress Notes (Signed)
 Patient ID: Joseph [name]                 DOB: [birth year]                      MRN: [id number]      HPI: Joseph [name] is a 89 y.o. male referred by Dr. Michele to HTN clinic. PMH is significant for hypertension, orthostatic hypotension, OSA, COPD, HLD.  Patient BP medications adjusted various time due low and high BP home readings. Patient was referred to hypertension clinic.  At his last visit on 10/12/2023, the patient reported tolerating his current antihypertensive regimen without any issues. He monitors his blood pressure at home immediately upon waking. His hydralazine  50 mg is taken three times daily--around 8:00 AM, 3:00 PM, and 11:00 PM. He also takes valsartan  320 mg at 11:00 PM.  The patient was previously on amlodipine , but it was discontinued. He is unsure of the reason for its discontinuation. He adheres to a low-salt diet. From last 2 visit patient' BP is at goal - office readings. Today we validated home BP monitor. Pt follows correct steps to measures BP at home - assessed. BP monitor reads within 10 points compared to office cuff. States it is hard to follow low salt diet all the time as he lives alone and he has to depends on outside food some days of the week where he can't control salt intake. But overall he is leaning more towards home cooked meals.   Current HTN meds: valsartan  320 mg daily at 11 pm , hydralazine  50 mg three times daily hold if SBP <120 (8 am, 3 pm and 11 pm)  Previously tried: amlodipine  - does not recall reason for discontinuation, denies swelling from it  BP goal: <140/90 given advance age  CrCl : 50 mL/min    Social History:  Alcohol : none  Smoking : none  Diet: eating home cooked low salt meals, selecting low or no salt added food option.   Exercise:  Walk outside when weather permits - 1 mile (about 20-25 min) SOB limits exercise capacity   Home BP readings: varies a lot when he wait before checking his BP at goal but when he does not it  ~140 150/70 range    Wt Readings from Last 3 Encounters:  01/13/24 175 lb 9.6 oz (79.7 kg)  12/29/23 170 lb (77.1 kg)  10/21/23 175 lb (79.4 kg)   BP Readings from Last 3 Encounters:  02/02/24 128/71  01/13/24 (!) 162/68  12/29/23 129/77   Pulse Readings from Last 3 Encounters:  02/02/24 76  01/13/24 72  12/13/23 89    Renal function: CrCl cannot be calculated (Patient's most recent lab result is older than the maximum 21 days allowed.).  Past Medical History:  Diagnosis Date   Arthritis    COPD (chronic obstructive pulmonary disease) (HCC)    Dyspnea    on exertion   Hyperlipidemia    Hypertension    Macular pucker, right eye 01/11/2020   The nature of macular pucker (epiretinal membrane ERM) was discussed with the patient as well as threshold criteria for vitrectomy surgery. I explained that in rare cases another surgery is needed to actually remove a second wrinkle should it regrow.  Most often, the epiretinal membrane and underlying wrinkled internal limiting membrane are removed with the first surgery, to accomplish the goals.    OSA (obstructive sleep apnea)    severe OSA with AHI of 32.5/hr  and CSA of 6.4/hr.   Pneumothorax, spontaneous, tension 1960   SOB (shortness of breath) on exertion     Current Outpatient Medications on File Prior to Visit  Medication Sig Dispense Refill   albuterol  (VENTOLIN  HFA) 108 (90 Base) MCG/ACT inhaler Inhale 2 puffs into the lungs every 6 (six) hours as needed for wheezing or shortness of breath. (Patient not taking: Reported on 12/29/2023)     alendronate (FOSAMAX) 70 MG tablet Take 70 mg by mouth once a week.     aspirin  81 MG chewable tablet Chew 81 mg by mouth daily.     dutasteride  (AVODART ) 0.5 MG capsule Take 0.5 mg by mouth daily.     hydrALAZINE  (APRESOLINE ) 50 MG tablet Take 1 tablet (50 mg total) by mouth 3 (three) times daily. Hold if SBP less than     levothyroxine  (SYNTHROID ) 137 MCG tablet Take 137 mcg by mouth  daily before breakfast. Was told on 11/06/19 by PCP not to take     lovastatin (MEVACOR) 40 MG tablet Take 40 mg by mouth daily.     magnesium  oxide (MAG-OX) 400 MG tablet Take 400 mg by mouth daily.     meloxicam (MOBIC) 7.5 MG tablet Take 7.5 mg by mouth daily as needed for pain.      nitroGLYCERIN  (NITROSTAT ) 0.4 MG SL tablet Place 1 tablet (0.4 mg total) under the tongue every 5 (five) minutes as needed for chest pain. 30 tablet 3   omeprazole (PRILOSEC) 40 MG capsule Take 20 mg by mouth daily before breakfast.     valsartan  (DIOVAN ) 320 MG tablet TAKE 1 TABLET BY MOUTH EVERY DAY 90 tablet 1   No current facility-administered medications on file prior to visit.    Allergies  Allergen Reactions   Ciprofloxacin Anaphylaxis and Other (See Comments)    Caused sores & blisters and a trip to the hospital- was also taking Aldara at the same time, however  Other Reaction(s): Elspeth Louder Syndrome   Ciprocin-Fluocin-Procin [Fluocinolone Acetonide] Other (See Comments)    Blisters and sores   Ezetimibe Other (See Comments)   Fluocinolone Acetonide Other (See Comments)    Reaction not recalled   Imiquimod Other (See Comments)    Other Reaction(s): Elspeth Louder Syndrome   Quinolones Other (See Comments)    Pt had to go to hospital (sores and blisters)   Statins Other (See Comments)    Made the legs hurt   Imiquimod Other (See Comments)    (Aldara) Caused sores that landed him in the hospital for 3 days- was taking Cipro at the same time, however   Penicillin V Potassium Rash   Penicillins Itching, Rash and Other (See Comments)    Has patient had a PCN reaction causing immediate rash, facial/tongue/throat swelling, SOB or lightheadedness with hypotension: Yes Has patient had a PCN reaction causing severe rash involving mucus membranes or skin necrosis: No Has patient had a PCN reaction that required hospitalization: No Has patient had a PCN reaction occurring within the last 10 years:  No If all of the above answers are NO, then may proceed with Cephalosporin use.     Blood pressure 128/71, pulse 76, SpO2 98%.   Assessment/Plan:  1. Hypertension -  Primary hypertension Assessment: BP is controlled in office BP 128/71 mmHg heart rate 79 (goal<130/80) Home monitor validated today found to be accurate- reads within 10 points compared to the office monitor  Tolerates current BP meds well without any side effects Denies palpitation, chest  pain, headaches,or swelling SOB when active, has COPD  Follows correct steps for how to check BP at home  except he does not like the 5 min rest period - advised to take 1 st reading and discard that and take 2nd and 3rd average for recording  Follows low salt diet and walks 1 mile outside when weather permits    Plan:  No medications changes given at goal BP Continue taking valsartan  320 mg daily at 11 pm , hydralazine  50 mg three times daily hold if SBP <120 (8 am, 3 pm and 11 pm)  Patient to keep record of BP readings with heart rate and report to us  at the next visit Patient to see PharmD in 12 weeks for follow up  Follow up lab(s) : none    Thank you  Robbi Blanch, Pharm.D [facility] A Division of  [facility] [address], [place], [place] [address]  Phone: [phone]; Fax: [phone]

## 2024-02-10 DIAGNOSIS — E7800 Pure hypercholesterolemia, unspecified: Secondary | ICD-10-CM | POA: Diagnosis not present

## 2024-02-10 DIAGNOSIS — E039 Hypothyroidism, unspecified: Secondary | ICD-10-CM | POA: Diagnosis not present

## 2024-02-10 DIAGNOSIS — G4733 Obstructive sleep apnea (adult) (pediatric): Secondary | ICD-10-CM | POA: Diagnosis not present

## 2024-02-11 DIAGNOSIS — L820 Inflamed seborrheic keratosis: Secondary | ICD-10-CM | POA: Diagnosis not present

## 2024-02-14 NOTE — Telephone Encounter (Signed)
**Note De-Identified Shuaib Corsino Obfuscation** Per letter received from [employer], they have approved this BIPAP PA from 03/06/2024-06/04/2024. Outpatient Authorization [id number]  I have transferred the order to the sleep lab.

## 2024-02-15 ENCOUNTER — Other Ambulatory Visit: Payer: Self-pay

## 2024-02-15 DIAGNOSIS — R8271 Bacteriuria: Secondary | ICD-10-CM | POA: Diagnosis not present

## 2024-02-15 DIAGNOSIS — D1432 Benign neoplasm of left bronchus and lung: Secondary | ICD-10-CM | POA: Diagnosis not present

## 2024-02-15 DIAGNOSIS — E039 Hypothyroidism, unspecified: Secondary | ICD-10-CM | POA: Diagnosis not present

## 2024-02-15 DIAGNOSIS — Z0000 Encounter for general adult medical examination without abnormal findings: Secondary | ICD-10-CM | POA: Diagnosis not present

## 2024-02-15 DIAGNOSIS — N1831 Chronic kidney disease, stage 3a: Secondary | ICD-10-CM | POA: Diagnosis not present

## 2024-02-15 DIAGNOSIS — R42 Dizziness and giddiness: Secondary | ICD-10-CM

## 2024-02-15 DIAGNOSIS — I700 Atherosclerosis of aorta: Secondary | ICD-10-CM | POA: Diagnosis not present

## 2024-02-15 DIAGNOSIS — I2510 Atherosclerotic heart disease of native coronary artery without angina pectoris: Secondary | ICD-10-CM | POA: Diagnosis not present

## 2024-02-15 DIAGNOSIS — I6529 Occlusion and stenosis of unspecified carotid artery: Secondary | ICD-10-CM | POA: Diagnosis not present

## 2024-02-15 DIAGNOSIS — M8000XA Age-related osteoporosis with current pathological fracture, unspecified site, initial encounter for fracture: Secondary | ICD-10-CM | POA: Diagnosis not present

## 2024-02-15 DIAGNOSIS — I951 Orthostatic hypotension: Secondary | ICD-10-CM

## 2024-02-15 DIAGNOSIS — K219 Gastro-esophageal reflux disease without esophagitis: Secondary | ICD-10-CM | POA: Diagnosis not present

## 2024-02-15 DIAGNOSIS — J449 Chronic obstructive pulmonary disease, unspecified: Secondary | ICD-10-CM | POA: Diagnosis not present

## 2024-02-15 DIAGNOSIS — I10 Essential (primary) hypertension: Secondary | ICD-10-CM

## 2024-02-15 MED ORDER — HYDRALAZINE HCL 50 MG PO TABS: 50.0000 mg | ORAL_TABLET | Freq: Three times a day (TID) | ORAL | 0 refills | Status: DC

## 2024-02-16 ENCOUNTER — Telehealth: Payer: Self-pay | Admitting: Cardiology

## 2024-02-16 DIAGNOSIS — R399 Unspecified symptoms and signs involving the genitourinary system: Secondary | ICD-10-CM | POA: Diagnosis not present

## 2024-02-16 NOTE — Telephone Encounter (Signed)
*  STAT* If patient is at the pharmacy, call can be transferred to refill team.   1. Which medications need to be refilled? (please list name of each medication and dose if known)   hydrALAZINE  (APRESOLINE ) 50 MG tablet   2. Would you like to learn more about the convenience, safety, & potential cost savings by using the [facility]?   3. Are you open to using the [facility] (Type [facility]. ).  4. Which pharmacy/location (including street and city if local pharmacy) is medication to be sent to?  [id number] - [place], [place] - [address]   5. Do they need a 30 day or 90 day supply?   90 day  Patient stated he still has some medication.

## 2024-02-17 ENCOUNTER — Other Ambulatory Visit: Payer: Self-pay | Admitting: Internal Medicine

## 2024-02-17 ENCOUNTER — Other Ambulatory Visit: Payer: Self-pay | Admitting: Cardiology

## 2024-02-17 DIAGNOSIS — I951 Orthostatic hypotension: Secondary | ICD-10-CM

## 2024-02-17 DIAGNOSIS — I10 Essential (primary) hypertension: Secondary | ICD-10-CM

## 2024-02-17 DIAGNOSIS — R42 Dizziness and giddiness: Secondary | ICD-10-CM

## 2024-02-21 MED ORDER — HYDRALAZINE HCL 50 MG PO TABS: 50.0000 mg | ORAL_TABLET | Freq: Three times a day (TID) | ORAL | 3 refills | Status: DC

## 2024-02-21 MED ORDER — HYDRALAZINE HCL 50 MG PO TABS: 50.0000 mg | ORAL_TABLET | Freq: Three times a day (TID) | ORAL | 3 refills | Status: AC

## 2024-02-21 NOTE — Telephone Encounter (Signed)
 Pt calling in back in requesting 90 days be sent if possible.

## 2024-02-21 NOTE — Telephone Encounter (Signed)
 Hydralazine  50 mg tid was sent in 02/17/24 for # 30 and should have been for a 90 day supply. [facility]

## 2024-02-21 NOTE — Addendum Note (Signed)
 Addended by: MEMORY DELON POUR on: 02/21/2024 03:19 PM   Modules accepted: Orders

## 2024-02-21 NOTE — Addendum Note (Signed)
 Addended by: MEMORY DELON POUR on: 02/21/2024 03:17 PM   Modules accepted: Orders

## 2024-03-07 NOTE — Telephone Encounter (Signed)
**Note De-Identified Zylen Wenig Obfuscation** Per request from Robbi Blanch, [facility], I called the pt and provided him with the [facility]'s phone number and advised him that he can call them to schedule his BIPAP Titration and that if he gets their VM to leave a message and that they will call him back.  The pt verbalized understanding and thanked me for my call.

## 2024-03-08 DIAGNOSIS — R399 Unspecified symptoms and signs involving the genitourinary system: Secondary | ICD-10-CM | POA: Diagnosis not present

## 2024-03-12 DIAGNOSIS — G4733 Obstructive sleep apnea (adult) (pediatric): Secondary | ICD-10-CM | POA: Diagnosis not present

## 2024-03-21 ENCOUNTER — Ambulatory Visit (HOSPITAL_BASED_OUTPATIENT_CLINIC_OR_DEPARTMENT_OTHER): Attending: Cardiology | Admitting: Cardiology

## 2024-03-21 DIAGNOSIS — G4733 Obstructive sleep apnea (adult) (pediatric): Secondary | ICD-10-CM | POA: Diagnosis not present

## 2024-03-27 NOTE — Procedures (Signed)
  Indications for Polysomnography The patient is an 89 year old Male who is 6' and weighs 175.0 lbs. His BMI equals 24.0.  A full night titration treatment study was performed.  Medications Taken at 2215:OMEPRAZOLELOVASTATINHYDRALAZINEMAGNESIUMVALSARTAN Polysomnogram Data A full night polysomnogram recorded the standard physiologic parameters including EEG, EOG, EMG, EKG, nasal and oral airflow.  Respiratory parameters of chest and abdominal movements were recorded with Respiratory Inductance Plethysmography belts.   Oxygen saturation was recorded by pulse oximetry.  Sleep Architecture The total recording time of the polysomnogram was 396.9 minutes.  The total sleep time was 130.5 minutes.  The patient spent 28.7% of total sleep time in Stage N1, 71.3% in Stage N2, 0.0% in Stages N3, and 0.0% in REM.  Sleep latency was 30.8 minutes.   REM latency was - minutes.  Sleep Efficiency was 32.9%.  Wake after Sleep Onset time was 235.5 minutes.  Titration Summary The patient was titrated at pressures ranging from 8/4 cm/H20 up to 18/12 cm/H20.  The last pressure used in the study was 18/12 cm/H20.  Respiratory Events The polysomnogram revealed a presence of 10 obstructive, 51 central, and 4 mixed apneas resulting in an Apnea index of 29.9 events per hour.  There were 40 hypopneas (GreaterEqual to3% desaturation and/or arousal) resulting in an Apnea\Hypopnea Index  (AHI GreaterEqual to3% desaturation and/or arousal) of 48.3 events per hour.  There were 18 hypopneas (GreaterEqual to4% desaturation) resulting in an Apnea\Hypopnea Index (AHI GreaterEqual to4% desaturation) of 38.2 events per hour.  There were 96  Respiratory Effort Related Arousals resulting in a RERA index of 44.1 events per hour. The Respiratory Disturbance Index is 92.4 events per hour.  The snore index was 0 events per hour.  Mean oxygen saturation was 95.2%.  The lowest oxygen saturation during sleep was 89.0%.  Time spent LessEqual  to88% oxygen saturation was  0 minutes.  Limb Activity There were 0- limb movements recorded.  Cardiac Summary The average pulse rate was 58.1 bpm.  The minimum pulse rate was 49.0 bpm while the maximum pulse rate was 84.0 bpm.  Cardiac rhythm was normal.   Diagnosis: Obstructive Sleep Apnea     Recommendations: 1.  Recommend a trial of ResMed BiPAP S/T at 17/13cm H2O with backup rate of 17, heated humidity and  ResMed Airfit P10 nasal pillow mask with chin strap. 2. Close follow-up is necessary to ensure success with CPAP therapy for maximum benefit. 3. A follow-up oximetry study on CPAP is recommended to assess the adequacy of therapy and determine the need for supplemental oxygen or the potential need for Bi-level therapy.  An arterial blood gas to determine the adequacy of baseline ventilation and  oxygenation should also be considered. 4. Healthy sleep recommendations include:  adequate nightly sleep (normal 7-9 hrs/night), avoidance of caffeine after noon and alcohol  near bedtime, and maintaining a sleep environment that is cool, dark and quiet 5.  Weight loss for overweight patients is recommended.  Even modest amounts of weight loss can significantly improve the severity of sleep apnea. 6. Snoring recommendations include:  weight loss where appropriate, side sleeping, and avoidance of alcohol  before bed. 7. Operation of motor vehicle should be avoided when sleepy.    This study was personally reviewed and electronically signed by: SHLOMO WILBERT SAUNDERS., MD Accredited Board Certified in Sleep Medicine Date/Time: 03/27/2024 3:39PM

## 2024-03-29 ENCOUNTER — Telehealth: Payer: Self-pay | Admitting: Cardiology

## 2024-03-29 DIAGNOSIS — R399 Unspecified symptoms and signs involving the genitourinary system: Secondary | ICD-10-CM | POA: Diagnosis not present

## 2024-03-29 NOTE — Telephone Encounter (Signed)
 Pt requesting callback in regards to his recent sleep study results, Please advise.

## 2024-03-29 NOTE — Telephone Encounter (Signed)
 Routed to sleep studies team

## 2024-03-30 ENCOUNTER — Telehealth: Payer: Self-pay | Admitting: *Deleted

## 2024-03-30 NOTE — Telephone Encounter (Signed)
-----   Message from Wilbert Bihari sent at 03/27/2024  3:43 PM EDT ----- Please let patient know that they had a successful PAP titration and let DME know that orders are in EPIC.  Please set up 6 week OV with me.  Please let patient know that they had a successful PAP titration and let DME know that orders are in EPIC.  Please set up 6 week OV with me.

## 2024-03-30 NOTE — Telephone Encounter (Signed)
 The patient has been notified of the result and verbalized understanding.  All questions (if any) were answered. Joshua Dalton Seip, CMA 03/30/2024 11:05 AM     The patient states he already has a cpap machine and he didn't really know why he had the sleep study. I explained to the patient that in your last office visit note on 01/13/24 you noted.SABRA Unfortunately his AHI is too high on auto CPAP without any real significant mask leak - I am going to refer him to the sleep lab for a BiPAP titration   the patient ask if he could talk to you, I told him he would have to make an appointment. I advised him to send a mychart message but he stated he can't use it well. I offered him to speak to the sleep nurse and he agreed to that. He says he has some questions.

## 2024-04-03 NOTE — Telephone Encounter (Signed)
 Call to patient who requests explanation of bipap titration. Explained that titration seemed to indicate that he needed more support and most likely cpap will be discontinued and replaced with bipap. Patient states he was not getting  much relief from cpap anyway. Explained that order for bipap will be sent to insurance and insurance will decide coverage. Advised that he will hear from either his DME company or from our office when insurance approves. Patient wanted to know if he could keep his current mask, advised that getting the right mask fit with a different type of machine could take some trial and error. Patient verbalizes understanding.

## 2024-04-11 DIAGNOSIS — G4733 Obstructive sleep apnea (adult) (pediatric): Secondary | ICD-10-CM | POA: Diagnosis not present

## 2024-04-14 DIAGNOSIS — R8290 Unspecified abnormal findings in urine: Secondary | ICD-10-CM | POA: Diagnosis not present

## 2024-04-24 ENCOUNTER — Telehealth: Payer: Self-pay | Admitting: Cardiology

## 2024-04-25 NOTE — Telephone Encounter (Signed)
 Patient would like to share home blood pressure readings and has provided an email address for follow-up. I will review the submitted readings and advise the patient accordingly.

## 2024-05-01 NOTE — Progress Notes (Unsigned)
 Patient ID: Joseph [name]                 DOB: 1935/02/05                      MRN: [id number]      HPI: Joseph [name] is a 89 y.o. male referred by Dr. Michele to HTN clinic. PMH is significant for hypertension, orthostatic hypotension, OSA, COPD, HLD.  Patient BP medications adjusted various time due low and high BP home readings. Patient was referred to hypertension clinic.  At his last visit on 10/12/2023, the patient reported tolerating his current antihypertensive regimen without any issues. He monitors his blood pressure at home immediately upon waking. His hydralazine  50 mg is taken three times daily--around 8:00 AM, 3:00 PM, and 11:00 PM. He also takes valsartan  320 mg at 11:00 PM.  The patient was previously on amlodipine , but it was discontinued. He is unsure of the reason for its discontinuation. He adheres to a low-salt diet. From last 2 visit patient' BP is at goal - office readings. Today we validated home BP monitor. Pt follows correct steps to measures BP at home - assessed. BP monitor reads within 10 points compared to office cuff. States it is hard to follow low salt diet all the time as he lives alone and he has to depends on outside food some days of the week where he can't control salt intake. But overall he is leaning more towards home cooked meals.   Current HTN meds: valsartan  320 mg daily at 11 pm , hydralazine  50 mg three times daily hold if SBP <120 (8 am, 3 pm and 11 pm)  Previously tried: amlodipine  - does not recall reason for discontinuation, denies swelling from it  BP goal: <140/90 given advance age  CrCl : 50 mL/min    Social History:  Alcohol : none  Smoking : none  Diet: eating home cooked low salt meals, selecting low or no salt added food option.   Exercise:  Walk outside when weather permits - 1 mile (about 20-25 min) SOB limits exercise capacity   Home BP readings: varies a lot when he wait before checking his BP at goal but when he does not it  ~140 150/70 range    Wt Readings from Last 3 Encounters:  03/21/24 175 lb (79.4 kg)  01/13/24 175 lb 9.6 oz (79.7 kg)  12/29/23 170 lb (77.1 kg)   BP Readings from Last 3 Encounters:  02/02/24 128/71  01/13/24 (!) 162/68  12/29/23 129/77   Pulse Readings from Last 3 Encounters:  02/02/24 76  01/13/24 72  12/13/23 89    Renal function: CrCl cannot be calculated (Patient's most recent lab result is older than the maximum 21 days allowed.).  Past Medical History:  Diagnosis Date   Arthritis    COPD (chronic obstructive pulmonary disease) (HCC)    Dyspnea    on exertion   Hyperlipidemia    Hypertension    Macular pucker, right eye 01/11/2020   The nature of macular pucker (epiretinal membrane ERM) was discussed with the patient as well as threshold criteria for vitrectomy surgery. I explained that in rare cases another surgery is needed to actually remove a second wrinkle should it regrow.  Most often, the epiretinal membrane and underlying wrinkled internal limiting membrane are removed with the first surgery, to accomplish the goals.    OSA (obstructive sleep apnea)    severe OSA with AHI of 32.5/hr  and CSA of 6.4/hr.   Pneumothorax, spontaneous, tension 1960   SOB (shortness of breath) on exertion     Current Outpatient Medications on File Prior to Visit  Medication Sig Dispense Refill   albuterol  (VENTOLIN  HFA) 108 (90 Base) MCG/ACT inhaler Inhale 2 puffs into the lungs every 6 (six) hours as needed for wheezing or shortness of breath. (Patient not taking: Reported on 12/29/2023)     alendronate (FOSAMAX) 70 MG tablet Take 70 mg by mouth once a week.     aspirin  81 MG chewable tablet Chew 81 mg by mouth daily.     dutasteride  (AVODART ) 0.5 MG capsule Take 0.5 mg by mouth daily.     hydrALAZINE  (APRESOLINE ) 50 MG tablet Take 1 tablet (50 mg total) by mouth 3 (three) times daily. Hold if SBP less than 270 tablet 3   levothyroxine  (SYNTHROID ) 137 MCG tablet Take 137  mcg by mouth daily before breakfast. Was told on 11/06/19 by PCP not to take     lovastatin (MEVACOR) 40 MG tablet Take 40 mg by mouth daily.     magnesium  oxide (MAG-OX) 400 MG tablet Take 400 mg by mouth daily.     meloxicam (MOBIC) 7.5 MG tablet Take 7.5 mg by mouth daily as needed for pain.      nitroGLYCERIN  (NITROSTAT ) 0.4 MG SL tablet Place 1 tablet (0.4 mg total) under the tongue every 5 (five) minutes as needed for chest pain. 30 tablet 3   omeprazole (PRILOSEC) 40 MG capsule Take 20 mg by mouth daily before breakfast.     valsartan  (DIOVAN ) 320 MG tablet TAKE 1 TABLET BY MOUTH EVERY DAY 90 tablet 1   No current facility-administered medications on file prior to visit.    Allergies  Allergen Reactions   Ciprofloxacin Anaphylaxis and Other (See Comments)    Caused sores & blisters and a trip to the hospital- was also taking Aldara at the same time, however  Other Reaction(s): Elspeth Louder Syndrome   Ciprocin-Fluocin-Procin [Fluocinolone Acetonide] Other (See Comments)    Blisters and sores   Ezetimibe Other (See Comments)   Fluocinolone Acetonide Other (See Comments)    Reaction not recalled   Imiquimod Other (See Comments)    Other Reaction(s): Elspeth Louder Syndrome   Quinolones Other (See Comments)    Pt had to go to hospital (sores and blisters)   Statins Other (See Comments)    Made the legs hurt   Imiquimod Other (See Comments)    (Aldara) Caused sores that landed him in the hospital for 3 days- was taking Cipro at the same time, however   Penicillin V Potassium Rash   Penicillins Itching, Rash and Other (See Comments)    Has patient had a PCN reaction causing immediate rash, facial/tongue/throat swelling, SOB or lightheadedness with hypotension: Yes Has patient had a PCN reaction causing severe rash involving mucus membranes or skin necrosis: No Has patient had a PCN reaction that required hospitalization: No Has patient had a PCN reaction occurring within the last  10 years: No If all of the above answers are NO, then may proceed with Cephalosporin use.     There were no vitals taken for this visit.   Assessment/Plan:  1. Hypertension -  No problem-specific Assessment & Plan notes found for this encounter.   Thank you  Robbi Blanch, Pharm.D [facility] A Division of [facility] [address], [place], [place] [address]  Phone: [phone]; Fax: [phone]

## 2024-05-02 ENCOUNTER — Ambulatory Visit: Attending: Internal Medicine | Admitting: Pharmacist

## 2024-05-02 ENCOUNTER — Encounter: Payer: Self-pay | Admitting: Pharmacist

## 2024-05-02 VITALS — BP 157/78 | HR 68

## 2024-05-02 DIAGNOSIS — I10 Essential (primary) hypertension: Secondary | ICD-10-CM

## 2024-05-02 NOTE — Assessment & Plan Note (Signed)
 Assessment and plan: The patient's home blood pressure readings average around 138/83 mmHg, while today's in-office BP was 150/79 mmHg. She is currently tolerating her antihypertensive regimen well, which includes valsartan  320 mg at bedtime and hydralazine  three times daily, with no reported side effects. Given the elevated average home BP (measured using a validated home monitor), a thiazide diuretic will be added to her ARB regimen if today's BMP is within normal limits. Labs will be drawn today to assess renal function and electrolytes. The patient previously discontinued amlodipine  due to hypotension but has expressed a preference for once-daily medications over multiple daily doses like hydralazine . The long-term plan is to transition from hydralazine  to amlodipine , provided his BP remains stable and tolerability improves.

## 2024-05-03 ENCOUNTER — Ambulatory Visit: Payer: Self-pay | Admitting: Pharmacist

## 2024-05-03 DIAGNOSIS — I10 Essential (primary) hypertension: Secondary | ICD-10-CM

## 2024-05-03 LAB — BASIC METABOLIC PANEL WITH GFR
BUN/Creatinine Ratio: 18 (ref 10–24)
BUN: 22 mg/dL (ref 8–27)
CO2: 23 mmol/L (ref 20–29)
Calcium: 9.8 mg/dL (ref 8.6–10.2)
Chloride: 105 mmol/L (ref 96–106)
Creatinine, Ser: 1.24 mg/dL (ref 0.76–1.27)
Glucose: 83 mg/dL (ref 70–99)
Potassium: 4.3 mmol/L (ref 3.5–5.2)
Sodium: 141 mmol/L (ref 134–144)
eGFR: 56 mL/min/1.73 — ABNORMAL LOW (ref >59)

## 2024-05-03 MED ORDER — HYDROCHLOROTHIAZIDE 12.5 MG PO CAPS: 12.5000 mg | ORAL_CAPSULE | Freq: Every day | ORAL | 3 refills | Status: AC

## 2024-05-03 NOTE — Telephone Encounter (Signed)
 Lab discussed other the phone. To lower BP We will add small dose hydrochlorothiazide  12.5 mg daily to other BP meds. F/u lab in 2 weeks. Patient to report if BP gets too low and share home BP readings via e-mail

## 2024-05-09 DIAGNOSIS — K5909 Other constipation: Secondary | ICD-10-CM | POA: Diagnosis not present

## 2024-05-10 NOTE — Telephone Encounter (Signed)
 Patient reported he was havinf some dizziness in the morning so he switched his hydrochlorothiazide  from am to later during the day but his dizziness still persist. He doesn't check his BP in the morning. He checks it in the afternoon only they are mainly at goal. Advised to check BP in the morning after taking hydralazine  morning dose and advised him to reduce hydralazine  moe

## 2024-05-18 DIAGNOSIS — H00011 Hordeolum externum right upper eyelid: Secondary | ICD-10-CM | POA: Diagnosis not present

## 2024-05-24 ENCOUNTER — Telehealth: Payer: Self-pay | Admitting: Pharmacist

## 2024-05-24 NOTE — Telephone Encounter (Signed)
 Patient was confuse about his BP meds. Clarify, he need to take hydrochlorothiazide  and valsartan  regular base and adjust hydralazine  50 mg three times daily dose according to the BP if SBP <120 hold the dose. He notes that since he started taking hydrochlorothiazide  he is needing less hydralazine .

## 2024-06-06 DIAGNOSIS — H3562 Retinal hemorrhage, left eye: Secondary | ICD-10-CM | POA: Diagnosis not present

## 2024-06-06 DIAGNOSIS — H33321 Round hole, right eye: Secondary | ICD-10-CM | POA: Diagnosis not present

## 2024-06-06 DIAGNOSIS — H43812 Vitreous degeneration, left eye: Secondary | ICD-10-CM | POA: Diagnosis not present

## 2024-06-06 DIAGNOSIS — H353132 Nonexudative age-related macular degeneration, bilateral, intermediate dry stage: Secondary | ICD-10-CM | POA: Diagnosis not present

## 2024-06-07 NOTE — Telephone Encounter (Addendum)
 Reached out to the patient and he has decided to keep the cpap machine he has. He states he can not wear the cpap and he is not too sure the Bipap machine will work either. His download shows noncompliance. He should be at 77% to be compliant but instead he is at 37% compliance.

## 2024-06-11 DIAGNOSIS — G4733 Obstructive sleep apnea (adult) (pediatric): Secondary | ICD-10-CM | POA: Diagnosis not present

## 2024-07-21 NOTE — Telephone Encounter (Signed)
 Call to f/u on BP - patient has not been checking it regularly form last few weeks. Advised to keep log and call back in 7-10 day
